# Patient Record
Sex: Female | Born: 2000 | Race: Black or African American | Hispanic: No | Marital: Single | Smoking: Never smoker
Health system: Southern US, Community
[De-identification: ages and names within clinical notes are randomized; demographics above are authoritative.]

## PROBLEM LIST (undated history)

## (undated) ENCOUNTER — Inpatient Hospital Stay: Payer: Self-pay

## (undated) DIAGNOSIS — Z862 Personal history of diseases of the blood and blood-forming organs and certain disorders involving the immune mechanism: Secondary | ICD-10-CM

## (undated) DIAGNOSIS — J45909 Unspecified asthma, uncomplicated: Secondary | ICD-10-CM

## (undated) DIAGNOSIS — O24419 Gestational diabetes mellitus in pregnancy, unspecified control: Secondary | ICD-10-CM

## (undated) DIAGNOSIS — Z369 Encounter for antenatal screening, unspecified: Secondary | ICD-10-CM

## (undated) DIAGNOSIS — D649 Anemia, unspecified: Secondary | ICD-10-CM

## (undated) DIAGNOSIS — L732 Hidradenitis suppurativa: Secondary | ICD-10-CM

## (undated) DIAGNOSIS — J189 Pneumonia, unspecified organism: Secondary | ICD-10-CM

## (undated) HISTORY — DX: Gestational diabetes mellitus in pregnancy, unspecified control: O24.419

## (undated) HISTORY — DX: Personal history of diseases of the blood and blood-forming organs and certain disorders involving the immune mechanism: Z86.2

## (undated) HISTORY — DX: Encounter for antenatal screening, unspecified: Z36.9

## (undated) HISTORY — DX: Hidradenitis suppurativa: L73.2

---
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
## 2018-01-27 NOTE — L&D Delivery Note (Signed)
Date of delivery: 08/03/18 Estimated Date of Delivery: 08/16/18 Patient's last menstrual period was 11/09/2017. EGA: [redacted]w[redacted]d  Delivery Note At 2:56 PM a viable female was delivered via Vaginal, Spontaneous (Presentation: direct OA; cephalic).  APGAR: 4, 7; 9   weight 7 lb 2.6 oz (3250 g).   Placenta status: spontaneous, intact, with circumvallate membranes, eccentric cord insertion, +infarctions.  Cord: 3vv with tight nuchal, reduced after delivery,  with the following complications: none apparent.  Cord pH: collected but not sent per peds - apgar was 9 at time of decision.  Anesthesia:  epidural Episiotomy:  no Lacerations:  2nd degree Suture Repair: 3.0 vicryl vicryl rapide Est. Blood Loss (mL): 6  Mom presented to L&D with labor, augmented with pitocin.  epidual placed. Progressed to complete, second stage: 3 hours, with manual rotation of fetus from ROP to L/direct OA. Tight nuchal encountered, unable to be reduced at perineum. Delivery of fetal head with restitution to ROT.   Anterior then posterior shoulders delivered without difficulty, and cord unwound from baby's neck and body.  Baby placed on mom's chest, and attended to by peds, however baby was not crying and remained flaccid.  After about 20 seconds, the cord was clamped and cut and the pediatric team took over at the warmer. Placenta spontaneously delivered, intact.   IV pitocin given for hemorrhage prophylaxis.  2nd degree repaired in standard fashion.  We sang happy birthday to baby Nazir.  Mom to postpartum.  Baby to Couplet care / Skin to Skin.  Chelsea C Ward 08/04/2018, 11:32 AM

## 2018-01-28 LAB — OB RESULTS CONSOLE HEPATITIS B SURFACE ANTIGEN: Hepatitis B Surface Ag: NEGATIVE

## 2018-01-28 LAB — OB RESULTS CONSOLE VARICELLA ZOSTER ANTIBODY, IGG: Varicella: NON-IMMUNE/NOT IMMUNE

## 2018-01-28 LAB — OB RESULTS CONSOLE RUBELLA ANTIBODY, IGM: Rubella: IMMUNE

## 2018-02-01 ENCOUNTER — Other Ambulatory Visit: Payer: Self-pay | Admitting: Certified Nurse Midwife

## 2018-02-01 DIAGNOSIS — Z369 Encounter for antenatal screening, unspecified: Secondary | ICD-10-CM

## 2018-02-08 ENCOUNTER — Ambulatory Visit
Admission: RE | Admit: 2018-02-08 | Discharge: 2018-02-08 | Disposition: A | Discharge status: Home / Self Care | Payer: Medicaid Other | Source: Ambulatory Visit | Attending: Obstetrics and Gynecology | Admitting: Obstetrics and Gynecology

## 2018-02-08 ENCOUNTER — Ambulatory Visit (HOSPITAL_BASED_OUTPATIENT_CLINIC_OR_DEPARTMENT_OTHER)
Admission: RE | Admit: 2018-02-08 | Discharge: 2018-02-08 | Disposition: A | Discharge status: Home / Self Care | Payer: Medicaid Other | Source: Ambulatory Visit | Attending: Obstetrics and Gynecology | Admitting: Obstetrics and Gynecology

## 2018-02-08 ENCOUNTER — Other Ambulatory Visit: Payer: Self-pay

## 2018-02-08 VITALS — BP 127/77 | HR 98 | Temp 98.2°F | Resp 18 | Ht 62.0 in | Wt 136.0 lb

## 2018-02-08 DIAGNOSIS — Z3A13 13 weeks gestation of pregnancy: Secondary | ICD-10-CM | POA: Insufficient documentation

## 2018-02-08 DIAGNOSIS — Z369 Encounter for antenatal screening, unspecified: Secondary | ICD-10-CM | POA: Diagnosis not present

## 2018-02-08 DIAGNOSIS — Z3682 Encounter for antenatal screening for nuchal translucency: Secondary | ICD-10-CM | POA: Diagnosis present

## 2018-02-08 HISTORY — DX: Unspecified asthma, uncomplicated: J45.909

## 2018-02-08 LAB — FERRITIN: FERRITIN: 19 ng/mL (ref 11–307)

## 2018-02-08 NOTE — Progress Notes (Addendum)
Jaci Standard Length of Consultation: 40 minutes   Ms. Letterman  was referred to [place] for genetic counseling to review prenatal screening and testing options.  This note summarizes the information we discussed.    We offered the following routine screening tests for this pregnancy:  First trimester screening, which includes nuchal translucency ultrasound screen and first trimester maternal serum marker screening.  The nuchal translucency has approximately an 80% detection rate for Down syndrome and can be positive for other chromosome abnormalities as well as congenital heart defects.  When combined with a maternal serum marker screening, the detection rate is up to 90% for Down syndrome and up to 97% for trisomy 18.     Maternal serum marker screening, a blood test that measures pregnancy proteins, can provide risk assessments for Down syndrome, trisomy 18, and open neural tube defects (spina bifida, anencephaly). Because it does not directly examine the fetus, it cannot positively diagnose or rule out these problems.  Targeted ultrasound uses high frequency sound waves to create an image of the developing fetus.  An ultrasound is often recommended as a routine means of evaluating the pregnancy.  It is also used to screen for fetal anatomy problems (for example, a heart defect) that might be suggestive of a chromosomal or other abnormality.   Should these screening tests indicate an increased concern, then the following additional testing options would be offered:  The chorionic villus sampling procedure is available for first trimester chromosome analysis.  This involves the withdrawal of a small amount of chorionic villi (tissue from the developing placenta).  Risk of pregnancy loss is estimated to be approximately 1 in 200 to 1 in 100 (0.5 to 1%).  There is approximately a 1% (1 in 100) chance that the CVS chromosome results will be unclear.  Chorionic villi  cannot be tested for neural tube defects.     Amniocentesis involves the removal of a small amount of amniotic fluid from the sac surrounding the fetus with the use of a thin needle inserted through the maternal abdomen and uterus.  Ultrasound guidance is used throughout the procedure.  Fetal cells from amniotic fluid are directly evaluated and > 99.5% of chromosome problems and > 98% of open neural tube defects can be detected. This procedure is generally performed after the 15th week of pregnancy.  The main risks to this procedure include complications leading to miscarriage in less than 1 in 200 cases (0.5%).  As another option for information if the pregnancy is suspected to be an an increased chance for certain chromosome conditions, we also reviewed the availability of cell free fetal DNA testing from maternal blood to determine whether or not the baby may have either Down syndrome, trisomy 58, or trisomy 58.  This test utilizes a maternal blood sample and DNA sequencing technology to isolate circulating cell free fetal DNA from maternal plasma.  The fetal DNA can then be analyzed for DNA sequences that are derived from the three most common chromosomes involved in aneuploidy, chromosomes 13, 18, and 21.  If the overall amount of DNA is greater than the expected level for any of these chromosomes, aneuploidy is suspected.  While we do not consider it a replacement for invasive testing and karyotype analysis, a negative result from this testing would be reassuring, though not a guarantee of a normal chromosome complement for the baby.  An abnormal result is certainly suggestive of an abnormal chromosome complement, though we would still  recommend CVS or amniocentesis to confirm any findings from this testing.  Cystic Fibrosis and Spinal Muscular Atrophy (SMA) screening were also discussed with the patient. Both conditions are recessive, which means that both parents must be carriers in order to have a  child with the disease.  Cystic fibrosis (CF) is one of the most common genetic conditions in persons of Caucasian ancestry.  This condition occurs in approximately 1 in 2,500 Caucasian persons and results in thickened secretions in the lungs, digestive, and reproductive systems.  For a baby to be at risk for having CF, both of the parents must be carriers for this condition.  Approximately 1 in 34 Caucasian persons is a carrier for CF.  Current carrier testing looks for the most common mutations in the gene for CF and can detect approximately 90% of carriers in the Caucasian population.  This means that the carrier screening can greatly reduce, but cannot eliminate, the chance for an individual to have a child with CF.  If an individual is found to be a carrier for CF, then carrier testing would be available for the partner. As part of [place] newborn screening profile, all babies born in the state of [place] will have a two-tier screening process.  Specimens are first tested to determine the concentration of immunoreactive trypsinogen (IRT).  The top 5% of specimens with the highest IRT values then undergo DNA testing using a panel of over 40 common CF mutations. SMA is a neurodegenerative disorder that leads to atrophy of skeletal muscle and overall weakness.  This condition is also more prevalent in the Caucasian population, with 1 in 40-1 in 60 persons being a carrier and 1 in 6,000-1 in 10,000 children being affected.  There are multiple forms of the disease, with some causing death in infancy to other forms with survival into adulthood.  The genetics of SMA is complex, but carrier screening can detect up to 95% of carriers in the Caucasian population.  Similar to CF, a negative result can greatly reduce, but cannot eliminate, the chance to have a child with SMA.  We obtained a detailed family history and pregnancy history. In the family, Ms. Kilty reported one maternal aunt with learning  disabilities related to severe premature birth.  Though there are many reasons for developmental differences, if this is related to the early delivery, then we would not expect an increased risk for other family members to have a similar condition. She also stated that one paternal half sister was born with polydactyly.  No other family members in this family have extra fingers or toes. This is a known dominant trait in many families but can also be part of numerous genetic syndromes.  In the absence of a syndrome, it may have been inherited from the mother's family (unrelated to Ms. Connett) or may be an isolated birth difference but would be expected to have a low chance for recurrence in this pregnancy given the lack of more closely related affected individuals. Lastly, the patient's father is said to have been born with an extra nipple.  He is otherwise in good health with normal growth and development.  This can be seen as an isolated finding or as part of genetic conditions as well.  Because he is otherwise in good health, it is unlikely to put this pregnancy at high risk for a genetic syndrome.The remainder of the family history was reported to be unremarkable for birth defects, intellectual delays, recurrent pregnancy loss or known  chromosome abnormalities.  Ms. Enfield reported that this is the first pregnancy for she and her partner, Novella Rob.  She reported no complications or exposures in this pregnancy that would be expected to increase the risk for birth defects. Upon review of prenatal labs, Ms. Leu had a low MCV (72).  We therefore offered iron studies and a hemoglobinopathy evaluation to assess the reason for this lab result.  Those were drawn today and should be available within 1 week.  After consideration of the options, Ms. Rada elected to proceed with first trimester screening, hemoglobinopathy testing and ferritin.  She declined CF and SMA carrier testing.  An ultrasound was  performed at the time of the visit.  The gestational age was consistent with 56 weeks.  Fetal anatomy could not be assessed due to early gestational age.  Please refer to the ultrasound report for details of that study.  Ms. Cornia was encouraged to call with questions or concerns.  We can be contacted at [phone].  Labs ordered:  First trimester screening, hemoglobinopathy profile, ferritin  Wilburt Finlay, MS, CGC I met the pt and agree with the summary of her counseling   Gatha Mayer

## 2018-02-09 LAB — HEMOGLOBINOPATHY EVALUATION
HGB A2 QUANT: 1.8 % (ref 1.8–3.2)
HGB A: 98.2 % (ref 96.4–98.8)
HGB C: 0 %
HGB F QUANT: 0 % (ref 0.0–2.0)
HGB S QUANTITAION: 0 %
Hgb Variant: 0 %

## 2018-02-11 ENCOUNTER — Telehealth: Payer: Self-pay | Admitting: Obstetrics and Gynecology

## 2018-02-11 NOTE — Telephone Encounter (Signed)
Morgan Collier  elected to undergo First Trimester screening on 02/08/2018.  To review, first trimester screening, includes nuchal translucency ultrasound screen and/or first trimester maternal serum marker screening.  The nuchal translucency has approximately an 80% detection rate for Down syndrome and can be positive for other chromosome abnormalities as well as heart defects.  When combined with a maternal serum marker screening, the detection rate is up to 90% for Down syndrome and up to 97% for trisomy 13 and 18.     The results of the First Trimester Nuchal Translucency and Biochemical Screening were within normal range.  The risk for Down syndrome is now estimated to be 1 in 4,228.  The risk for Trisomy 13/18 is less than 1 in 10,000.  Should more definitive information be desired, we would offer amniocentesis.  Because we do not yet know the effectiveness of combined first and second trimester screening, we do not recommend a maternal serum screen to assess the chance for chromosome conditions.  However, if screening for neural tube defects is desired, maternal serum screening for AFP only can be performed between 15 and [redacted] weeks gestation.    Because the patient has a low MCV (72), we offered additional testing for hemoglobinopathies and iron studies.  The hemoglobinopathy evaluation showed normal adult hemoglobin (AA).  Per Dr. Jimmey Ralph, the iron was mildly low (19) and she would recommend an over the counter iron supplement.  Another possible reason for low MCV with a normal hemoglobinopathy evaluation could be alpha thalassemia trait.  Two gene deletion alpha thalassemia can be an explanation.  If she would like additional testing, we can offer genetic testing for alpha thalassemia.  Most cases of two gene deletion alpha thalassemia in African Americans occur in trans, meaning that each chromosome contains one normal gene and one deleted gene.  In this case, only 1 deleted gene is passed on  from the carrier parent.  Even if the other parent has two gene deletions as well, then fetus would most likely only have two gene deletions, though additional testing on the patient and possibly her partner would be needed to further assess this.  Newborn screening at the time of birth can be used as well as determine the presence of clinically significant hemoglobinopathies in this newborn.   Morgan Anderson, MS, CGC

## 2018-04-30 ENCOUNTER — Inpatient Hospital Stay: Payer: Medicaid Other

## 2018-04-30 ENCOUNTER — Observation Stay
Admission: EM | Admit: 2018-04-30 | Discharge: 2018-04-30 | Disposition: A | Discharge status: Home / Self Care | Payer: Medicaid Other | Attending: Obstetrics and Gynecology | Admitting: Obstetrics and Gynecology

## 2018-04-30 ENCOUNTER — Other Ambulatory Visit: Payer: Self-pay

## 2018-04-30 DIAGNOSIS — O4692 Antepartum hemorrhage, unspecified, second trimester: Principal | ICD-10-CM | POA: Insufficient documentation

## 2018-04-30 DIAGNOSIS — J45909 Unspecified asthma, uncomplicated: Secondary | ICD-10-CM | POA: Insufficient documentation

## 2018-04-30 DIAGNOSIS — O99512 Diseases of the respiratory system complicating pregnancy, second trimester: Secondary | ICD-10-CM | POA: Insufficient documentation

## 2018-04-30 DIAGNOSIS — Z79899 Other long term (current) drug therapy: Secondary | ICD-10-CM | POA: Insufficient documentation

## 2018-04-30 DIAGNOSIS — Z3A24 24 weeks gestation of pregnancy: Secondary | ICD-10-CM | POA: Insufficient documentation

## 2018-04-30 DIAGNOSIS — O4690 Antepartum hemorrhage, unspecified, unspecified trimester: Secondary | ICD-10-CM | POA: Diagnosis present

## 2018-04-30 DIAGNOSIS — R58 Hemorrhage, not elsewhere classified: Secondary | ICD-10-CM

## 2018-04-30 LAB — CBC
HCT: 36.1 % (ref 36.0–46.0)
Hemoglobin: 11.6 g/dL — ABNORMAL LOW (ref 12.0–15.0)
MCH: 23.1 pg — ABNORMAL LOW (ref 26.0–34.0)
MCHC: 32.1 g/dL (ref 30.0–36.0)
MCV: 71.9 fL — ABNORMAL LOW (ref 80.0–100.0)
Platelets: 244 10*3/uL (ref 150–400)
RBC: 5.02 MIL/uL (ref 3.87–5.11)
RDW: 15.3 % (ref 11.5–15.5)
WBC: 10.5 10*3/uL (ref 4.0–10.5)
nRBC: 0 % (ref 0.0–0.2)

## 2018-04-30 LAB — URINALYSIS, ROUTINE W REFLEX MICROSCOPIC
Bacteria, UA: NONE SEEN
Bilirubin Urine: NEGATIVE
Glucose, UA: NEGATIVE mg/dL
Hgb urine dipstick: NEGATIVE
Ketones, ur: NEGATIVE mg/dL
Nitrite: NEGATIVE
Protein, ur: NEGATIVE mg/dL
Specific Gravity, Urine: 1.013 (ref 1.005–1.030)
pH: 7 (ref 5.0–8.0)

## 2018-04-30 LAB — FETAL SCREEN: Fetal Screen: NEGATIVE

## 2018-04-30 LAB — TYPE AND SCREEN
ABO/RH(D): A NEG
Antibody Screen: NEGATIVE

## 2018-04-30 MED ORDER — RHO D IMMUNE GLOBULIN 1500 UNIT/2ML IJ SOSY
300.0000 ug | PREFILLED_SYRINGE | Freq: Once | INTRAMUSCULAR | Status: DC
  Filled 2018-04-30: qty 2

## 2018-04-30 NOTE — Discharge Summary (Signed)
Patient ID: Morgan Collier, female   DOB: 01/01/2001, 18 y.o.   MRN: [id number]  Subjective   Morgan Collier is a 18 y.o. female. She is at [redacted]w[redacted]d gestation. Patient's last menstrual period was 11/09/2017. Estimated Date of Delivery: 08/16/18 G1P0 Prenatal care site: [facility]   Chief complaint:vaginal spotting and cramping started last pm  Good fetal movement . No dysuria or gross hematuria . No recent intercourse  placenta fundal on anatomic exam  RH neg       Maternal Medical History:       Past Medical History:  Diagnosis Date  . Asthma    Seasonal    Past Surgical History:  Procedure Laterality Date  . INCISION AND DRAINAGE ABSCESS Right 10/10/2015   Procedure: Incision and drainage of complex axillary abscess Excisions of complex hidradenitis on right axilla                            3. Excisional debridement of skin subcutaneous tissue;  Surgeon: Leafy Ro, MD;  Location: [facility];  Service: General;  Laterality: Right;  . WISDOM TOOTH EXTRACTION  2016    No Known Allergies         Prior to Admission medications   Medication Sig Start Date End Date Taking? Authorizing Provider  Prenatal Vit-Fe Fumarate-FA (PRENATAL MULTIVITAMIN) TABS tablet Take 1 tablet by mouth daily at 12 noon.   Yes [provider]  PROAIR HFA 108 (90 Base) MCG/ACT inhaler Inhale 2 puffs into the lungs every 6 (six) hours as needed. 10/26/15  Yes [provider]     Social History: She  reports that she has never smoked. She has never used smokeless tobacco. She reports that she does not drink alcohol or use drugs.  Family History: family history includes ADD / ADHD in her father; Heart disease in her maternal grandmother; Hypertension in her maternal grandmother and paternal grandmother; Throat cancer in her maternal uncle. no history of gyn cancers  Review of Systems: A full review of systems was performed and negative  except as noted in the HPI.   Review of Systems: A full review of systems was performed and negativeexcept as noted in the HPI.  Eyes: no vision change  Ears: left ear pain  Oropharynx: no sore throat  Pulmonary . No shortness of breath , no hemoptysis Cardiovascular: no chest pain , no irregular heart beat  Gastrointestinal:no blood in stool . No diarrhea, no constipation Uro gynecologic: no dysuria , no pelvic pain, + vaginal spotting  Neurologic : no seizure , no migraines  Musculoskeletal: no muscular weakness  O:  Objective   BP 134/79 (BP Location: Left Arm)   Pulse (!) 103   Temp 98.3 F (36.8 C) (Oral)   Resp 18   Ht  (1.6 m)   Wt 68.5 kg   LMP 11/09/2017   SpO2 100%   BMI 26.75 kg/m       Results for orders placed or performed during the hospital encounter of 04/30/18 (from the past 48 hour(s))  CBC   Collection Time: 04/30/18  1:31 PM  Result Value Ref Range   WBC 10.5 4.0 - 10.5 K/uL   RBC 5.02 3.87 - 5.11 MIL/uL   Hemoglobin 11.6 (L) 12.0 - 15.0 g/dL   HCT 65.7 84.6 - 96.2 %   MCV 71.9 (L) 80.0 - 100.0 fL   MCH 23.1 (L) 26.0 - 34.0 pg  MCHC 32.1 30.0 - 36.0 g/dL   RDW 40.0 86.7 - 61.9 %   Platelets 244 150 - 400 K/uL   nRBC 0.0 0.0 - 0.2 %     EXAM: LIMITED OBSTETRIC ULTRASOUND  FINDINGS: Number of Fetuses: 1  Heart Rate: 141 bpm  Movement: Yes  Presentation: Cephalic  Placental Location: Posterior  Previa: No  Amniotic Fluid (Subjective): Within normal limits  BPD: 6.4 cm 25 w 6 d  MATERNAL FINDINGS:  Cervix: Appears closed. Measures 3.9 cm in length.  Uterus/Adnexae: No abnormality visualized.  IMPRESSION: Single living IUP in cephalic presentation. No evidence of placenta previa, abruption, or other acute maternal findings.  This exam is performed on an emergent basis and does not comprehensively evaluate fetal size, dating, or anatomy; follow-up complete OB US should be considered if  further fetal assessment is warranted.   Constitutional: NAD, AAOx3  HE/ENT: extraocular movements grossly intact, moist mucous membranes CV: RRR PULM: nl respiratory effort, CTABL                                         Abd: gravid, non-tender, non-distended, soft                                                  Ext: Non-tender, Nonedmeatous                     Psych: mood appropriate, speech normal Pelvic cx closed + long . No blood on exam glove     NST:  Baseline: 140-150 Variability: moderate  Decelerations absent Time  LabResultsLast24Hours       Results for orders placed or performed during the hospital encounter of 04/30/18 (from the past 24 hour(s))  CBC     Status: Abnormal   Collection Time: 04/30/18  1:31 PM  Result Value Ref Range   WBC 10.5 4.0 - 10.5 K/uL   RBC 5.02 3.87 - 5.11 MIL/uL   Hemoglobin 11.6 (L) 12.0 - 15.0 g/dL   HCT 50.9 32.6 - 71.2 %   MCV 71.9 (L) 80.0 - 100.0 fL   MCH 23.1 (L) 26.0 - 34.0 pg   MCHC 32.1 30.0 - 36.0 g/dL   RDW 45.8 09.9 - 83.3 %   Platelets 244 150 - 400 K/uL   nRBC 0.0 0.0 - 0.2 %  Urinalysis, Routine w reflex microscopic     Status: Abnormal   Collection Time: 04/30/18  2:31 PM  Result Value Ref Range   Color, Urine YELLOW (A) YELLOW   APPearance CLOUDY (A) CLEAR   Specific Gravity, Urine 1.013 1.005 - 1.030   pH 7.0 5.0 - 8.0   Glucose, UA NEGATIVE NEGATIVE mg/dL   Hgb urine dipstick NEGATIVE NEGATIVE   Bilirubin Urine NEGATIVE NEGATIVE   Ketones, ur NEGATIVE NEGATIVE mg/dL   Protein, ur NEGATIVE NEGATIVE mg/dL   Nitrite NEGATIVE NEGATIVE   Leukocytes,Ua LARGE (A) NEGATIVE   RBC / HPF 0-5 0 - 5 RBC/hpf   WBC, UA 0-5 0 - 5 WBC/hpf   Bacteria, UA NONE SEEN NONE SEEN   Squamous Epithelial / LPF 0-5 0 - 5   Mucus PRESENT       Assessment: 18 y.o. [redacted]w[redacted]d here for antenatal surveillance during pregnancy.  Principle diagnosis: VAginal bleeding , no evidence on exam  glove today . No cervical dilation Normal U/S   Plan: D/c home with precautions   RTC if bleeding is noticed again  Rhogam administered today  ----- Beverly Gust MD Attending Obstetrician and Gynecologist [facility], Department of OB/GYN [facility]

## 2018-04-30 NOTE — Progress Notes (Signed)
Patient ID: Morgan Collier, female   DOB: 10-Jun-2000, 18 y.o.   MRN: [id number]  Morgan Collier is a 18 y.o. female. She is at [redacted]w[redacted]d gestation. Patient's last menstrual period was 11/09/2017. Estimated Date of Delivery: 08/16/18 G1P0 Prenatal care site: [facility]   Chief complaint:vaginal spotting and cramping started last pm  Good fetal movement . No dysuria or gross hematuria . No recent intercourse  placenta fundal on anatomic exam  RH neg       Maternal Medical History:   Past Medical History:  Diagnosis Date  . Asthma    Seasonal    Past Surgical History:  Procedure Laterality Date  . INCISION AND DRAINAGE ABSCESS Right 10/10/2015   Procedure: Incision and drainage of complex axillary abscess Excisions of complex hidradenitis on right axilla                            3. Excisional debridement of skin subcutaneous tissue;  Surgeon: Leafy Ro, MD;  Location: [facility];  Service: General;  Laterality: Right;  . WISDOM TOOTH EXTRACTION  2016    No Known Allergies  Prior to Admission medications   Medication Sig Start Date End Date Taking? Authorizing Provider  Prenatal Vit-Fe Fumarate-FA (PRENATAL MULTIVITAMIN) TABS tablet Take 1 tablet by mouth daily at 12 noon.   Yes [provider]  PROAIR HFA 108 (90 Base) MCG/ACT inhaler Inhale 2 puffs into the lungs every 6 (six) hours as needed. 10/26/15  Yes [provider]     Social History: She  reports that she has never smoked. She has never used smokeless tobacco. She reports that she does not drink alcohol or use drugs.  Family History: family history includes ADD / ADHD in her father; Heart disease in her maternal grandmother; Hypertension in her maternal grandmother and paternal grandmother; Throat cancer in her maternal uncle. no history of gyn cancers  Review of Systems: A full review of systems was performed and negative except as noted in the HPI.   Review of Systems: A full  review of systems was performed and negative except as noted in the HPI.   Eyes: no vision change  Ears: left ear pain  Oropharynx: no sore throat  Pulmonary . No shortness of breath , no hemoptysis Cardiovascular: no chest pain , no irregular heart beat  Gastrointestinal:no blood in stool . No diarrhea, no constipation Uro gynecologic: no dysuria , no pelvic pain, + vaginal spotting  Neurologic : no seizure , no migraines    Musculoskeletal: no muscular weakness  O:  BP 134/79 (BP Location: Left Arm)   Pulse (!) 103   Temp 98.3 F (36.8 C) (Oral)   Resp 18   Ht 5\' 3"  (1.6 m)   Wt 68.5 kg   LMP 11/09/2017   SpO2 100%   BMI 26.75 kg/m  Results for orders placed or performed during the hospital encounter of 04/30/18 (from the past 48 hour(s))  CBC   Collection Time: 04/30/18  1:31 PM  Result Value Ref Range   WBC 10.5 4.0 - 10.5 K/uL   RBC 5.02 3.87 - 5.11 MIL/uL   Hemoglobin 11.6 (L) 12.0 - 15.0 g/dL   HCT 75.9 16.3 - 84.6 %   MCV 71.9 (L) 80.0 - 100.0 fL   MCH 23.1 (L) 26.0 - 34.0 pg   MCHC 32.1 30.0 - 36.0 g/dL   RDW 65.9 93.5 - 70.1 %   Platelets  244 150 - 400 K/uL   nRBC 0.0 0.0 - 0.2 %     EXAM: LIMITED OBSTETRIC ULTRASOUND  FINDINGS: Number of Fetuses: 1  Heart Rate:  141 bpm  Movement: Yes  Presentation: Cephalic  Placental Location: Posterior  Previa: No  Amniotic Fluid (Subjective):  Within normal limits  BPD: 6.4 cm 25 w  6 d  MATERNAL FINDINGS:  Cervix:  Appears closed.  Measures 3.9 cm in length.  Uterus/Adnexae: No abnormality visualized.  IMPRESSION: Single living IUP in cephalic presentation. No evidence of placenta previa, abruption, or other acute maternal findings.  This exam is performed on an emergent basis and does not comprehensively evaluate fetal size, dating, or anatomy; follow-up complete OB US should be considered if further fetal assessment is warranted.   Constitutional: NAD, AAOx3  HE/ENT:  extraocular movements grossly intact, moist mucous membranes CV: RRR PULM: nl respiratory effort, CTABL     Abd: gravid, non-tender, non-distended, soft      Ext: Non-tender, Nonedmeatous   Psych: mood appropriate, speech normal Pelvic cx closed + long . No blood on exam glove     NST:  Baseline: 140-150 Variability: moderate  Decelerations absent Time  Results for orders placed or performed during the hospital encounter of 04/30/18 (from the past 24 hour(s))  CBC     Status: Abnormal   Collection Time: 04/30/18  1:31 PM  Result Value Ref Range   WBC 10.5 4.0 - 10.5 K/uL   RBC 5.02 3.87 - 5.11 MIL/uL   Hemoglobin 11.6 (L) 12.0 - 15.0 g/dL   HCT 78.4 69.6 - 29.5 %   MCV 71.9 (L) 80.0 - 100.0 fL   MCH 23.1 (L) 26.0 - 34.0 pg   MCHC 32.1 30.0 - 36.0 g/dL   RDW 28.4 13.2 - 44.0 %   Platelets 244 150 - 400 K/uL   nRBC 0.0 0.0 - 0.2 %  Urinalysis, Routine w reflex microscopic     Status: Abnormal   Collection Time: 04/30/18  2:31 PM  Result Value Ref Range   Color, Urine YELLOW (A) YELLOW   APPearance CLOUDY (A) CLEAR   Specific Gravity, Urine 1.013 1.005 - 1.030   pH 7.0 5.0 - 8.0   Glucose, UA NEGATIVE NEGATIVE mg/dL   Hgb urine dipstick NEGATIVE NEGATIVE   Bilirubin Urine NEGATIVE NEGATIVE   Ketones, ur NEGATIVE NEGATIVE mg/dL   Protein, ur NEGATIVE NEGATIVE mg/dL   Nitrite NEGATIVE NEGATIVE   Leukocytes,Ua LARGE (A) NEGATIVE   RBC / HPF 0-5 0 - 5 RBC/hpf   WBC, UA 0-5 0 - 5 WBC/hpf   Bacteria, UA NONE SEEN NONE SEEN   Squamous Epithelial / LPF 0-5 0 - 5   Mucus PRESENT     Assessment: 18 y.o. [redacted]w[redacted]d here for antenatal surveillance during pregnancy.  Principle diagnosis: VAginal bleeding , no evidence on exam glove today . No cervical dilation Normal U/S   Plan: D/c home with precautions   RTC if bleeding is noticed again  Rhogam administered today  ----- Beverly Gust MD Attending Obstetrician and Gynecologist [facility], Department of  OB/GYN [facility]

## 2018-04-30 NOTE — Discharge Summary (Signed)
RN reviewed lab results and discharge instructions with patient. Gave pt opportunity for any questions. Pt has no questions at this time. Pt verbalized understanding of discharge instructions. Pt discharged home

## 2018-04-30 NOTE — OB Triage Note (Signed)
Pt presents c/o vaginal bleeding described as spotting, bright red in color. Pt states she also has had some intermittent lower abdominal cramping develop within the last hour. Pt states she only notices bleeding when she wipes after using the restroom. She does not have to wear a pad. Denies any LOF. Reports last intercourse on March 28th.  Pt reports positive fetal movement. Vitals WNL. Will continue to monitor.

## 2018-05-01 ENCOUNTER — Encounter: Payer: Self-pay | Admitting: Certified Nurse Midwife

## 2018-05-01 ENCOUNTER — Observation Stay
Admission: EM | Admit: 2018-05-01 | Discharge: 2018-05-01 | Disposition: A | Discharge status: Home / Self Care | Payer: Medicaid Other | Attending: Certified Nurse Midwife | Admitting: Certified Nurse Midwife

## 2018-05-01 DIAGNOSIS — O209 Hemorrhage in early pregnancy, unspecified: Secondary | ICD-10-CM | POA: Diagnosis present

## 2018-05-01 DIAGNOSIS — J45909 Unspecified asthma, uncomplicated: Secondary | ICD-10-CM | POA: Diagnosis not present

## 2018-05-01 DIAGNOSIS — O26892 Other specified pregnancy related conditions, second trimester: Secondary | ICD-10-CM | POA: Diagnosis not present

## 2018-05-01 DIAGNOSIS — Z3A24 24 weeks gestation of pregnancy: Secondary | ICD-10-CM | POA: Diagnosis not present

## 2018-05-01 DIAGNOSIS — R1030 Lower abdominal pain, unspecified: Secondary | ICD-10-CM | POA: Insufficient documentation

## 2018-05-01 DIAGNOSIS — O99512 Diseases of the respiratory system complicating pregnancy, second trimester: Secondary | ICD-10-CM | POA: Diagnosis not present

## 2018-05-01 DIAGNOSIS — Z79899 Other long term (current) drug therapy: Secondary | ICD-10-CM | POA: Diagnosis not present

## 2018-05-01 DIAGNOSIS — O4692 Antepartum hemorrhage, unspecified, second trimester: Secondary | ICD-10-CM | POA: Diagnosis present

## 2018-05-01 DIAGNOSIS — N760 Acute vaginitis: Secondary | ICD-10-CM | POA: Insufficient documentation

## 2018-05-01 LAB — RHOGAM INJECTION: Unit division: 0

## 2018-05-01 LAB — URINE CULTURE
Culture: <10000 — AB
Special Requests: NORMAL

## 2018-05-01 LAB — WET PREP, GENITAL
Sperm: NONE SEEN
Trich, Wet Prep: NONE SEEN

## 2018-05-01 LAB — CHLAMYDIA/NGC RT PCR (ARMC ONLY)
Chlamydia Tr: NOT DETECTED
N gonorrhoeae: NOT DETECTED

## 2018-05-01 MED ORDER — METRONIDAZOLE 500 MG PO TABS: 500.0000 mg | ORAL_TABLET | Freq: Two times a day (BID) | ORAL | 0 refills | Status: AC

## 2018-05-01 MED ORDER — ACETAMINOPHEN 325 MG PO TABS: 650.0000 mg | ORAL_TABLET | ORAL | Status: DC | PRN

## 2018-05-01 MED ORDER — MICONAZOLE NITRATE 2 % VA CREA: 1.0000 | TOPICAL_CREAM | Freq: Every day | VAGINAL | 0 refills | Status: AC

## 2018-05-01 MED ORDER — ACETAMINOPHEN 325 MG PO TABS: 650.0000 mg | ORAL_TABLET | ORAL | 0 refills | Status: DC | PRN

## 2018-05-01 NOTE — Discharge Summary (Addendum)
Morgan Collier is a 18 y.o. female. She is at [redacted]w[redacted]d gestation. Patient's last menstrual period was 11/09/2017. Estimated Date of Delivery: 08/16/18  Prenatal care site: [facility]   Chief complaint: vaginal bleeding Location: vagina Onset/timing: started 4/2 in the evening Duration: intermittent Quality: bright red spotting with wiping Severity: mild Aggravating or alleviating conditions: none Associated signs/symptoms: lower abdominal cramping Context: Morgan Collier was seen in triage yesterday with reports of vaginal spotting, bright red, that started the evening of 4/2. She noticed it only with wiping when using the bathroom. She is here today, reporting that she has continued to have bright red spotting with wiping. She also noticed a small, tissue-like substance today, which she says she had in the past as well. The picture of this tissue appears to be melanated and is consistent with skin tissue. She also reports lower abdominal cramping and urinary frequency. She reports no recent vaginal sex. She reports no dysuria, no abnormal vaginal discharge, and no vaginal odor. She did undergo TCA treatment on the day of 04/29/2018 for verrucal lesions.   S: Resting comfortably.   She reports:  -active fetal movement -no leakage of fluid -no contractions  Maternal Medical History:   Past Medical History:  Diagnosis Date  . Asthma    Seasonal    Past Surgical History:  Procedure Laterality Date  . INCISION AND DRAINAGE ABSCESS Right 10/10/2015   Procedure: Incision and drainage of complex axillary abscess Excisions of complex hidradenitis on right axilla                            3. Excisional debridement of skin subcutaneous tissue;  Surgeon: Leafy Ro, MD;  Location: [facility];  Service: General;  Laterality: Right;  . WISDOM TOOTH EXTRACTION  2016    No Known Allergies  Prior to Admission medications   Medication Sig Start Date End Date Taking? Authorizing  Provider  Prenatal Vit-Fe Fumarate-FA (PRENATAL MULTIVITAMIN) TABS tablet Take 1 tablet by mouth daily at 12 noon.   Yes [provider]  PROAIR HFA 108 (90 Base) MCG/ACT inhaler Inhale 2 puffs into the lungs every 6 (six) hours as needed. 10/26/15  Yes [provider]     Social History: She  reports that she has never smoked. She has never used smokeless tobacco. She reports that she does not drink alcohol or use drugs.  Family History: family history includes ADD / ADHD in her father; Heart disease in her maternal grandmother; Hypertension in her maternal grandmother and paternal grandmother; Throat cancer in her maternal uncle.   Review of Systems: A full review of systems was performed and negative except as noted in the HPI.    O:  BP 133/77 (BP Location: Left Arm)   Pulse (!) 112   Temp 98.7 F (37.1 C) (Oral)   Resp 20   Ht  (1.6 m)   Wt 69 kg   LMP 11/09/2017   BMI 26.95 kg/m  Results for orders placed or performed during the hospital encounter of 05/01/18 (from the past 48 hour(s))  Wet prep, genital   Collection Time: 05/01/18  1:20 PM  Result Value Ref Range   Yeast Wet Prep HPF POC PRESENT (A) NONE SEEN   Trich, Wet Prep NONE SEEN NONE SEEN   Clue Cells Wet Prep HPF POC PRESENT (A) NONE SEEN   WBC, Wet Prep HPF POC TOO NUMEROUS TO COUNT (A) NONE SEEN  Sperm NONE SEEN   Results for orders placed or performed during the hospital encounter of 04/30/18 (from the past 48 hour(s))  CBC   Collection Time: 04/30/18  1:31 PM  Result Value Ref Range   WBC 10.5 4.0 - 10.5 K/uL   RBC 5.02 3.87 - 5.11 MIL/uL   Hemoglobin 11.6 (L) 12.0 - 15.0 g/dL   HCT 28.3 15.1 - 76.1 %   MCV 71.9 (L) 80.0 - 100.0 fL   MCH 23.1 (L) 26.0 - 34.0 pg   MCHC 32.1 30.0 - 36.0 g/dL   RDW 60.7 37.1 - 06.2 %   Platelets 244 150 - 400 K/uL   nRBC 0.0 0.0 - 0.2 %  Urinalysis, Routine w reflex microscopic   Collection Time: 04/30/18  2:31 PM  Result Value Ref Range    Color, Urine YELLOW (A) YELLOW   APPearance CLOUDY (A) CLEAR   Specific Gravity, Urine 1.013 1.005 - 1.030   pH 7.0 5.0 - 8.0   Glucose, UA NEGATIVE NEGATIVE mg/dL   Hgb urine dipstick NEGATIVE NEGATIVE   Bilirubin Urine NEGATIVE NEGATIVE   Ketones, ur NEGATIVE NEGATIVE mg/dL   Protein, ur NEGATIVE NEGATIVE mg/dL   Nitrite NEGATIVE NEGATIVE   Leukocytes,Ua LARGE (A) NEGATIVE   RBC / HPF 0-5 0 - 5 RBC/hpf   WBC, UA 0-5 0 - 5 WBC/hpf   Bacteria, UA NONE SEEN NONE SEEN   Squamous Epithelial / LPF 0-5 0 - 5   Mucus PRESENT   Fetal screen   Collection Time: 04/30/18  5:02 PM  Result Value Ref Range   Fetal Screen      NEG Performed at [facility], [address] [place], [place] [address]   Type and screen [facility]   Collection Time: 04/30/18  5:02 PM  Result Value Ref Range   ABO/RH(D) A NEG    Antibody Screen NEG    Sample Expiration      05/03/2018 Performed at [facility], [address] [place], [place] [address]   Rhogam injection   Collection Time: 04/30/18  5:02 PM  Result Value Ref Range   Unit Number [id number]    Blood Component Type RHIG    Unit division 00    Status of Unit ISSUED,FINAL    Transfusion Status      OK TO TRANSFUSE Performed at [facility], [address] [place], [place] [address]      Constitutional: NAD, AAOx3, appears quite comfortable HE/ENT: extraocular movements grossly intact, moist mucous membranes CV: RRR PULM: normal respiratory effort, CTABL     Abd: gravid, non-tender, non-distended, soft      Ext: Non-tender, Nonedmeatous   Psych: mood appropriate, speech normal Pelvic: SSE: cervix visually closed, small amount of thin white discharge in vaginal vault, no blood or active bleeding present; external exam: area of irritation on left labia, likely site of TCA treatment on 4/2  NST:  Baseline: 130-140bpm Variability: moderate Accelerations: 10x10 present x  >2 Decelerations: absent Time: Toco: quiet   Morgan Collier: 18 y.o. [redacted]w[redacted]d here for antenatal surveillance during pregnancy.  Principle diagnosis: vulvar bleeding during pregnancy  Labor  Not present  Fetal Wellbeing  Reactive NST, reassuring for GA  Bleeding  Likely from area of TCA treatment, which appears quite irritated. Advised application of Vaseline to this area.   GC/CT pending, plan to follow up as appropriate  Vaginitis  Wet prep with yeast, clue cells, and WBCs too numerous to count  Prescriptions for metronidazole 500mg  PO BID x 7 days and miconazole per vagina x 7 days sent to preferred pharmacy.   Cramping  No evidence of contractions on toco, no cervical dilation  Advised adequate hydration with at least 60 ounces of water daily and Tylenol PRN  Urine culture from yesterday pending, plan to follow up as appropriate  D/c home stable, precautions reviewed, follow-up as scheduled.    Genia Del 05/01/2018 1:55 PM  ----- Genia Del, CNM Certified Nurse Midwife [facility], Department of OB/GYN [facility]

## 2018-05-01 NOTE — OB Triage Note (Signed)
Pt was seen yesterday here for vaginal bleeding and cramping and sent home. This AM she stated that she continued to have bright red spotting when she wiped and then a "thing" fell out of her. Pt has picture of a small, tissue-like substance. Pt states she continues to have constant cramping and rates them 7/10. Pt denies LOF and endorses +FM.

## 2018-05-28 ENCOUNTER — Other Ambulatory Visit: Payer: Self-pay

## 2018-05-28 ENCOUNTER — Encounter: Payer: Self-pay | Admitting: *Deleted

## 2018-05-28 ENCOUNTER — Encounter: Payer: Medicaid Other | Attending: Obstetrics & Gynecology | Admitting: *Deleted

## 2018-05-28 VITALS — BP 108/60 | Ht 62.0 in | Wt 157.2 lb

## 2018-05-28 DIAGNOSIS — O24419 Gestational diabetes mellitus in pregnancy, unspecified control: Secondary | ICD-10-CM | POA: Insufficient documentation

## 2018-05-28 DIAGNOSIS — Z3A00 Weeks of gestation of pregnancy not specified: Secondary | ICD-10-CM | POA: Insufficient documentation

## 2018-05-28 DIAGNOSIS — O24410 Gestational diabetes mellitus in pregnancy, diet controlled: Secondary | ICD-10-CM

## 2018-05-28 NOTE — Patient Instructions (Signed)
Read booklet on Gestational Diabetes Follow Gestational Meal Planning Guidelines Don't skip meals Avoid fruit juices and sugar sweetened drinks Complete a 3 Day Food Record and bring to next appointment Check blood sugars 4 x day - before breakfast and 2 hrs after every meal and record  Bring blood sugar log to all appointments Call MD for prescription for meter strips and lancets Strips   Accu-Chek Guide Lancets   Accu-Chek FastClix Purchase urine ketone strips if blood sugars not controlled and check urine ketones every am:  If + increase bedtime snack to 1 protein and 2 carbohydrate servings Walk 20-30 minutes at least 5 x week if permitted by MD

## 2018-05-28 NOTE — Progress Notes (Signed)
Diabetes Self-Management Education  Visit Type: First/Initial  Appt. Start Time: 0905 Appt. End Time: 1035  05/28/2018  [name], identified by name and date of birth, is a 18 y.o. female with a diagnosis of Diabetes: Gestational Diabetes.   ASSESSMENT  Blood pressure 108/60, height 5\' 2"  (1.575 m), weight 157 lb 3.2 oz (71.3 kg), last menstrual period 11/09/2017. Body mass index is 28.75 kg/m.  Diabetes Self-Management Education - 05/28/18 1005      Visit Information   Visit Type  First/Initial      Initial Visit   Diabetes Type  Gestational Diabetes    Are you currently following a meal plan?  No    Are you taking your medications as prescribed?  Yes    Date Diagnosed  April 27      Health Coping   How would you rate your overall health?  Good      Psychosocial Assessment   Patient Belief/Attitude about Diabetes  Afraid   "scared, nervous"   Self-care barriers  None    Self-management support  Doctor's office;Family    Other persons present  Family Member   young sister   Patient Concerns  Nutrition/Meal planning;Glycemic Control;Monitoring;Healthy Lifestyle    Special Needs  None    Preferred Learning Style  Auditory;Visual;Hands on;Other (comment)   taking notes   Learning Readiness  Ready    How often do you need to have someone help you when you read instructions, pamphlets, or other written materials from your doctor or pharmacy?  1 - Never    What is the last grade level you completed in school?  currently in college      Pre-Education Assessment   Patient understands the diabetes disease and treatment process.  Needs Instruction    Patient understands incorporating nutritional management into lifestyle.  Needs Instruction    Patient undertands incorporating physical activity into lifestyle.  Needs Instruction    Patient understands using medications safely.  Needs Instruction    Patient understands monitoring blood glucose, interpreting and using  results  Needs Instruction    Patient understands prevention, detection, and treatment of acute complications.  Needs Instruction    Patient understands prevention, detection, and treatment of chronic complications.  Needs Instruction    Patient understands how to develop strategies to address psychosocial issues.  Needs Instruction    Patient understands how to develop strategies to promote health/change behavior.  Needs Instruction      Complications   How often do you check your blood sugar?  0 times/day (not testing)   Provided Accu-Chek Guide Me meter and instructed on use. BG upon return demonstration was 87 mg/dL at 16:1010:25 am - fasting.    Have you had a dilated eye exam in the past 12 months?  No    Have you had a dental exam in the past 12 months?  Yes    Are you checking your feet?  No      Dietary Intake   Breakfast  skips    Lunch  "anything" - burger, chicken nuggets, salad    Snack (afternoon)  chips    Dinner  chicken, steak, pork, spaghetti, potatoes, corn, baked beans, green beans, rice, greens, lettuce tomatoes, carrots, cuccumbers, broccoli    Snack (evening)  chips    Beverage(s)  water, fruit juice, lemonade with sugar      Exercise   Exercise Type  ADL's      Patient Education   Previous Diabetes Education  No   some education in college   Disease state   Definition of diabetes, type 1 and 2, and the diagnosis of diabetes;Factors that contribute to the development of diabetes    Nutrition management   Role of diet in the treatment of diabetes and the relationship between the three main macronutrients and blood glucose level;Reviewed blood glucose goals for pre and post meals and how to evaluate the patients' food intake on their blood glucose level.    Physical activity and exercise   Role of exercise on diabetes management, blood pressure control and cardiac health.    Monitoring  Taught/evaluated SMBG meter.;Purpose and frequency of SMBG.;Taught/discussed  recording of test results and interpretation of SMBG.;Ketone testing, when, how.    Chronic complications  Relationship between chronic complications and blood glucose control    Psychosocial adjustment  Identified and addressed patients feelings and concerns about diabetes    Preconception care  Pregnancy and GDM  Role of pre-pregnancy blood glucose control on the development of the fetus;Role of family planning for patients with diabetes;Reviewed with patient blood glucose goals with pregnancy      Individualized Goals (developed by patient)   Reducing Risk  Improve blood sugars Prevent diabetes complications Lead a healthier lifestyle Become more fit     Outcomes   Expected Outcomes  Demonstrated interest in learning. Expect positive outcomes       Individualized Plan for Diabetes Self-Management Training:   Learning Objective:  Patient will have a greater understanding of diabetes self-management. Patient education plan is to attend individual and/or group sessions per assessed needs and concerns.   Plan:   Patient Instructions  Read booklet on Gestational Diabetes Follow Gestational Meal Planning Guidelines Don't skip meals Avoid fruit juices and sugar sweetened drinks Complete a 3 Day Food Record and bring to next appointment Check blood sugars 4 x day - before breakfast and 2 hrs after every meal and record  Bring blood sugar log to all appointments Call MD for prescription for meter strips and lancets Strips   Accu-Chek Guide Lancets   Accu-Chek FastClix Purchase urine ketone strips if blood sugars not controlled and check urine ketones every am:  If + increase bedtime snack to 1 protein and 2 carbohydrate servings Walk 20-30 minutes at least 5 x week if permitted by MD  Expected Outcomes:  Demonstrated interest in learning. Expect positive outcomes  Education material provided:  Gestational Booklet Gestational Meal Planning Guidelines Simple Meal Plan Viewed  Gestational Diabetes Video Meter = Accu-Chek Guide 3 Day Food Record Goals for a Healthy Pregnancy  If problems or questions, patient to contact team via:  Sharion Settler, RN, CCM, CDE [phone]  Future DSME appointment:  Jun 03, 2018 with the dietitian

## 2018-06-01 ENCOUNTER — Observation Stay
Admission: EM | Admit: 2018-06-01 | Discharge: 2018-06-01 | Disposition: A | Discharge status: Home / Self Care | Payer: Medicaid Other | Attending: Obstetrics and Gynecology | Admitting: Obstetrics and Gynecology

## 2018-06-01 ENCOUNTER — Other Ambulatory Visit: Payer: Self-pay

## 2018-06-01 DIAGNOSIS — O24410 Gestational diabetes mellitus in pregnancy, diet controlled: Secondary | ICD-10-CM | POA: Insufficient documentation

## 2018-06-01 DIAGNOSIS — O4693 Antepartum hemorrhage, unspecified, third trimester: Secondary | ICD-10-CM | POA: Diagnosis not present

## 2018-06-01 DIAGNOSIS — Z7951 Long term (current) use of inhaled steroids: Secondary | ICD-10-CM | POA: Diagnosis not present

## 2018-06-01 DIAGNOSIS — O99513 Diseases of the respiratory system complicating pregnancy, third trimester: Secondary | ICD-10-CM | POA: Insufficient documentation

## 2018-06-01 DIAGNOSIS — Z79899 Other long term (current) drug therapy: Secondary | ICD-10-CM | POA: Insufficient documentation

## 2018-06-01 DIAGNOSIS — Z3A29 29 weeks gestation of pregnancy: Secondary | ICD-10-CM | POA: Insufficient documentation

## 2018-06-01 DIAGNOSIS — J45909 Unspecified asthma, uncomplicated: Secondary | ICD-10-CM | POA: Insufficient documentation

## 2018-06-01 LAB — WET PREP, GENITAL
Clue Cells Wet Prep HPF POC: NONE SEEN
Sperm: NONE SEEN
Trich, Wet Prep: NONE SEEN

## 2018-06-01 LAB — URINALYSIS, COMPLETE (UACMP) WITH MICROSCOPIC
Bacteria, UA: NONE SEEN
Bilirubin Urine: NEGATIVE
Glucose, UA: 150 mg/dL — AB
Hgb urine dipstick: NEGATIVE
Ketones, ur: 5 mg/dL — AB
Nitrite: NEGATIVE
Protein, ur: 30 mg/dL — AB
Specific Gravity, Urine: 1.03 (ref 1.005–1.030)
pH: 6 (ref 5.0–8.0)

## 2018-06-01 LAB — CHLAMYDIA/NGC RT PCR (ARMC ONLY)
Chlamydia Tr: NOT DETECTED
N gonorrhoeae: NOT DETECTED

## 2018-06-01 MED ORDER — FLUCONAZOLE 50 MG PO TABS
150.0000 mg | ORAL_TABLET | Freq: Once | ORAL | Status: AC
  Administered 2018-06-01: 20:00:00 150 mg via ORAL
  Filled 2018-06-01: qty 1

## 2018-06-01 MED ORDER — CLOTRIMAZOLE 1 % VA CREA: 1.0000 | TOPICAL_CREAM | Freq: Every day | VAGINAL | 1 refills | Status: DC

## 2018-06-01 NOTE — Discharge Summary (Signed)
Morgan Collier is a 18 y.o. female. She is at 3061w1d gestation. Patient's last menstrual period was 11/09/2017. Estimated Date of Delivery: 08/16/18  [facility]   Current pregnancy complicated by:  1. RH Negative- rhogam given 05/01/18 2. Varicella Non-Immune 3. Gestational Diabetes- diet controlled  Chief complaint: cramping and vaginal bleeding  Location: lower abdomen and back pain Onset/timing: 1100 this morning, pain c/w menstrual like cramping.  Duration: constant since this am.  Quality: bright red vaginal bleeding noted on panties, has not needed a pad.  Severity: n/a Aggravating or alleviating conditions: no recent IC for several months.  Associated signs/symptoms: reports vaginal discharge ongoing.  Context: recently dx with GDM- reports good control with diet only.   S: Resting comfortably. .no LOF,  Active fetal movement. Denies: HA, visual changes, SOB, or RUQ/epigastric pain  Maternal Medical History:   Past Medical History:  Diagnosis Date  . Asthma    Seasonal  . Gestational diabetes     Past Surgical History:  Procedure Laterality Date  . INCISION AND DRAINAGE ABSCESS Right 10/10/2015   Procedure: Incision and drainage of complex axillary abscess Excisions of complex hidradenitis on right axilla                            3. Excisional debridement of skin subcutaneous tissue;  Surgeon: Leafy Roiego F Pabon, MD;  [facility];  Service: General;  Laterality: Right;  . WISDOM TOOTH EXTRACTION  2016    No Known Allergies  Prior to Admission medications   Medication Sig Start Date End Date Taking? Authorizing Provider  acetaminophen (TYLENOL) 325 MG tablet Take 2 tablets (650 mg total) by mouth every 4 (four) hours as needed for mild pain or moderate pain (for pain scale < 4  OR  temperature  >/=  100.5 F). Patient not taking: Reported on 05/28/2018 05/01/18   Genia DelHaviland, Margaret, CNM  cetirizine (ZYRTEC) 10 MG tablet Take 10  mg by mouth daily as needed.  04/07/18 04/07/19  [provider]  fluticasone (FLONASE) 50 MCG/ACT nasal spray Place 1 spray into both nostrils daily as needed.  03/25/18   [provider]  Prenatal Vit-Fe Fumarate-FA (PRENATAL MULTIVITAMIN) TABS tablet Take 1 tablet by mouth daily at 12 noon.    [provider]  PROAIR HFA 108 [phone](90 Base) MCG/ACT inhaler Inhale 2 puffs into the lungs every 6 (six) hours as needed. 10/26/15   [provider]  valACYclovir (VALTREX) 500 MG tablet Take 500 mg by mouth 2 (two) times daily as needed.    [provider]      Social History: She  reports that she has never smoked. She has never used smokeless tobacco. She reports that she does not drink alcohol or use drugs.  Family History: family history includes ADD / ADHD in her father; Diabetes in her paternal grandmother; Heart disease in her maternal grandmother; Hypertension in her maternal grandmother and paternal grandmother; Throat cancer in her maternal uncle.   Review of Systems: A full review of systems was performed and negative except as noted in the HPI.     O:  BP 119/74 (BP Location: Left Arm)   Pulse 97   Temp 98.4 F (36.9 C) (Oral)   Resp 16   LMP 11/09/2017    Constitutional: NAD, AAOx3  HE/ENT: extraocular movements grossly intact, moist mucous membranes CV: RRR PULM: nl respiratory effort, CTABL  Abd: gravid, non-tender, non-distended, soft      Ext: Non-tender, Nonedematous   Psych: mood appropriate, speech normal Pelvic: SSE done- erythematous vaginal walls with mod amt creamy white/yellow adherent discharge noted throughout. Cervix visually closed, friable with scant amt bright red bleeding. Wet prep and GC/CT done.     Fetal  monitoring: Cat I Appropriate for GA Baseline: 130bpm Variability: moderate Accelerations: present x >2 Decelerations absent Time  Results:  Computer systems interface is down and computer results  are not available.   + yeast, no clue cells, trich, or sperm Neg for GC and CT per lab tech. UA + moderate leuks, neg nitrites, + 150 glucose, 30 protein, 5 ketones.  A/P: 18 y.o. [redacted]w[redacted]d here for antenatal surveillance for vaginal bleeding  Principle Diagnosis:  Vaginal bleeding, [redacted]wks gestation   Preterm labor: not present.   Fetal Wellbeing: Reassuring Cat 1 tracing with Reactive NST   Diflucan 150mg  today and ordered clotrimazole to pharmacy x 7 days vaginally.  D/c home stable, precautions reviewed, follow-up as scheduled.    Elenora Fender Revel Stellmach, MD 06/01/2018  7:29 PM

## 2018-06-01 NOTE — Discharge Instructions (Signed)
Vaginal Yeast infection, Adult  Vaginal yeast infection is a condition that causes vaginal discharge as well as soreness, swelling, and redness (inflammation) of the vagina. This is a common condition. Some women get this infection frequently. What are the causes? This condition is caused by a change in the normal balance of the yeast (candida) and bacteria that live in the vagina. This change causes an overgrowth of yeast, which causes the inflammation. What increases the risk? The condition is more likely to develop in women who:  Take antibiotic medicines.  Have diabetes.  Take birth control pills.  Are pregnant.  Douche often.  Have a weak body defense system (immune system).  Have been taking steroid medicines for a long time.  Frequently wear tight clothing. What are the signs or symptoms? Symptoms of this condition include:  White, thick, creamy vaginal discharge.  Swelling, itching, redness, and irritation of the vagina. The lips of the vagina (vulva) may be affected as well.  Pain or a burning feeling while urinating.  Pain during sex. How is this diagnosed? This condition is diagnosed based on:  Your medical history.  A physical exam.  A pelvic exam. Your health care provider will examine a sample of your vaginal discharge under a microscope. Your health care provider may send this sample for testing to confirm the diagnosis. How is this treated? This condition is treated with medicine. Medicines may be over-the-counter or prescription. You may be told to use one or more of the following:  Medicine that is taken by mouth (orally).  Medicine that is applied as a cream (topically).  Medicine that is inserted directly into the vagina (suppository). Follow these instructions at home:  Lifestyle  Do not have sex until your health care provider approves. Tell your sex partner that you have a yeast infection. That person should go to his or her health care  provider and ask if they should also be treated.  Do not wear tight clothes, such as pantyhose or tight pants.  Wear breathable cotton underwear. General instructions  Take or apply over-the-counter and prescription medicines only as told by your health care provider.  Eat more yogurt. This may help to keep your yeast infection from returning.  Do not use tampons until your health care provider approves.  Try taking a sitz bath to help with discomfort. This is a warm water bath that is taken while you are sitting down. The water should only come up to your hips and should cover your buttocks. Do this 3-4 times per day or as told by your health care provider.  Do not douche.  If you have diabetes, keep your blood sugar levels under control.  Keep all follow-up visits as told by your health care provider. This is important. Contact a health care provider if:  You have a fever.  Your symptoms go away and then return.  Your symptoms do not get better with treatment.  Your symptoms get worse.  You have new symptoms.  You develop blisters in or around your vagina.  You have blood coming from your vagina and it is not your menstrual period.  You develop pain in your abdomen. Summary  Vaginal yeast infection is a condition that causes discharge as well as soreness, swelling, and redness (inflammation) of the vagina.  This condition is treated with medicine. Medicines may be over-the-counter or prescription.  Take or apply over-the-counter and prescription medicines only as told by your health care provider.  Do not douche.  Do not have sex or use tampons until your health care provider approves.  Contact a health care provider if your symptoms do not get better with treatment or your symptoms go away and then return. This information is not intended to replace advice given to you by your health care provider. Make sure you discuss any questions you have with your health care  provider. Document Released: 10/23/2004 Document Revised: 06/01/2017 Document Reviewed: 06/01/2017 Elsevier Interactive Patient Education  2019 [employer]    Abdominal Pain During Pregnancy  Belly (abdominal) pain is common during pregnancy. There are many possible causes. Most of the time, it is not a serious problem. Other times, it can be a sign that something is wrong with the pregnancy. Always tell your doctor if you have belly pain. Follow these instructions at home:  Do not have sex or put anything in your vagina until your pain goes away completely.  Get plenty of rest until your pain gets better.  Drink enough fluid to keep your pee (urine) pale yellow.  Take over-the-counter and prescription medicines only as told by your doctor.  Keep all follow-up visits as told by your doctor. This is important. Contact a doctor if:  Your pain continues or gets worse after resting.  You have lower belly pain that: ? Comes and goes at regular times. ? Spreads to your back. ? Feels like menstrual cramps.  You have pain or burning when you pee (urinate). Get help right away if:  You have a fever or chills.  You have vaginal bleeding.  You are leaking fluid from your vagina.  You are passing tissue from your vagina.  You throw up (vomit) for more than 24 hours.  You have watery poop (diarrhea) for more than 24 hours.  Your baby is moving less than usual.  You feel very weak or faint.  You have shortness of breath.  You have very bad pain in your upper belly. Summary  Belly (abdominal) pain is common during pregnancy. There are many possible causes.  If you have belly pain during pregnancy, tell your doctor right away.  Keep all follow-up visits as told by your doctor. This is important. This information is not intended to replace advice given to you by your health care provider. Make sure you discuss any questions you have with your health care  provider. Document Released: 01/01/2009 Document Revised: 04/17/2016 Document Reviewed: 04/17/2016 Elsevier Interactive Patient Education  2019 [employer].

## 2018-06-03 ENCOUNTER — Encounter: Payer: Medicaid Other | Admitting: Dietician

## 2018-06-03 ENCOUNTER — Encounter: Payer: Self-pay | Admitting: Dietician

## 2018-06-03 ENCOUNTER — Other Ambulatory Visit: Payer: Self-pay

## 2018-06-03 VITALS — BP 130/44 | Ht 62.0 in | Wt 154.7 lb

## 2018-06-03 DIAGNOSIS — O24410 Gestational diabetes mellitus in pregnancy, diet controlled: Secondary | ICD-10-CM

## 2018-06-03 DIAGNOSIS — O24419 Gestational diabetes mellitus in pregnancy, unspecified control: Secondary | ICD-10-CM | POA: Diagnosis not present

## 2018-06-03 NOTE — Patient Instructions (Signed)
   Limit juice to no more than 4oz at a time; dilute with water or sparkling water.   Keep portions of starchy foods controlled, fist-size (1 cup) or less.  Good job including protein foods with your meals, keep it up!  You can always increase portions of veggies if you need to, to feel satisfied after meals.

## 2018-06-03 NOTE — Progress Notes (Signed)
   Patient's BG record indicates fasting BGs ranging 74-89, and post-meal BGs ranging 100-125.   Patient's food recall indicates regular consumption of fruit juices, otherwise generally balanced meals. She has been sleeping through breakfast recently, but states she will resume eating a small breakfast. Suggested having granola bar or crackers beside her bed if needed.   Provided 1900kcal meal plan, and wrote individualized menus based on patient's food preferences.  Instructed patient on food safety, including avoidance of Listeriosis, and limiting mercury from fish.  Discussed importance of maintaining healthy lifestyle habits to reduce risk of Type 2 DM as well as Gestational DM with any future pregnancies.  Advised patient to use any remaining testing supplies to test some BGs after delivery, and to have BG tested ideally annually, as well as prior to attempting future pregnancies.

## 2018-07-20 LAB — OB RESULTS CONSOLE GC/CHLAMYDIA
Chlamydia: NEGATIVE
Gonorrhea: NEGATIVE

## 2018-07-20 LAB — OB RESULTS CONSOLE GBS: GBS: NEGATIVE

## 2018-07-20 LAB — OB RESULTS CONSOLE RPR: RPR: NONREACTIVE

## 2018-07-20 LAB — OB RESULTS CONSOLE HIV ANTIBODY (ROUTINE TESTING): HIV: NONREACTIVE

## 2018-07-28 ENCOUNTER — Other Ambulatory Visit: Payer: Self-pay | Admitting: Certified Nurse Midwife

## 2018-07-28 NOTE — Progress Notes (Signed)
  Morgan Collier is a 18 y.o. G1P0 female dated by LMP c/w [redacted]w[redacted]d ultrasound on 12/29/2017.     Pregnancy Issues: 1. Rh negative, received Rhogam 05/01/2018 2. Varicella non-immune 3. Gestational diabetes, diet controlled   Prenatal care site: [facility]   EFW: ultrasound on 07/27/2018: 3246g 7lb2oz  Prenatal Labs: Blood type/Rh A neg  Antibody screen neg  Rubella Immune  Varicella Non-immune  RPR NR  HBsAg Neg  HIV NR  GC neg  Chlamydia neg  Genetic screening negative  1 hour GTT 216  GBS negative    Post Partum Planning: - Infant feeding: formula and breast - Contraception: none planned currently

## 2018-08-02 ENCOUNTER — Inpatient Hospital Stay
Admission: EM | Admit: 2018-08-02 | Discharge: 2018-08-04 | DRG: 807 | Disposition: A | Discharge status: Home / Self Care | Payer: Medicaid Other | Attending: Obstetrics and Gynecology | Admitting: Obstetrics and Gynecology

## 2018-08-02 ENCOUNTER — Inpatient Hospital Stay: Payer: Medicaid Other | Admitting: Anesthesiology

## 2018-08-02 ENCOUNTER — Observation Stay
Admission: EM | Admit: 2018-08-02 | Discharge: 2018-08-02 | Disposition: A | Discharge status: Home / Self Care | Payer: Medicaid Other | Source: Home / Self Care | Admitting: Obstetrics & Gynecology

## 2018-08-02 ENCOUNTER — Other Ambulatory Visit: Payer: Self-pay

## 2018-08-02 DIAGNOSIS — O24420 Gestational diabetes mellitus in childbirth, diet controlled: Secondary | ICD-10-CM | POA: Diagnosis present

## 2018-08-02 DIAGNOSIS — Z1159 Encounter for screening for other viral diseases: Secondary | ICD-10-CM

## 2018-08-02 DIAGNOSIS — O43123 Velamentous insertion of umbilical cord, third trimester: Secondary | ICD-10-CM | POA: Diagnosis present

## 2018-08-02 DIAGNOSIS — O479 False labor, unspecified: Secondary | ICD-10-CM | POA: Diagnosis present

## 2018-08-02 DIAGNOSIS — Z6791 Unspecified blood type, Rh negative: Secondary | ICD-10-CM | POA: Diagnosis not present

## 2018-08-02 DIAGNOSIS — Z3A38 38 weeks gestation of pregnancy: Secondary | ICD-10-CM

## 2018-08-02 DIAGNOSIS — O26893 Other specified pregnancy related conditions, third trimester: Secondary | ICD-10-CM | POA: Diagnosis present

## 2018-08-02 DIAGNOSIS — O471 False labor at or after 37 completed weeks of gestation: Secondary | ICD-10-CM | POA: Insufficient documentation

## 2018-08-02 LAB — CBC
HCT: 38.1 % (ref 36.0–46.0)
HCT: 40.2 % (ref 36.0–46.0)
Hemoglobin: 12.1 g/dL (ref 12.0–15.0)
Hemoglobin: 12.8 g/dL (ref 12.0–15.0)
MCH: 22.9 pg — ABNORMAL LOW (ref 26.0–34.0)
MCH: 22.9 pg — ABNORMAL LOW (ref 26.0–34.0)
MCHC: 31.8 g/dL (ref 30.0–36.0)
MCHC: 31.8 g/dL (ref 30.0–36.0)
MCV: 72.2 fL — ABNORMAL LOW (ref 80.0–100.0)
MCV: 72 fL — ABNORMAL LOW (ref 80.0–100.0)
Platelets: 240 10*3/uL (ref 150–400)
Platelets: 258 10*3/uL (ref 150–400)
RBC: 5.28 MIL/uL — ABNORMAL HIGH (ref 3.87–5.11)
RBC: 5.58 MIL/uL — ABNORMAL HIGH (ref 3.87–5.11)
RDW: 15.5 % (ref 11.5–15.5)
RDW: 15.6 % — ABNORMAL HIGH (ref 11.5–15.5)
WBC: 9.5 10*3/uL (ref 4.0–10.5)
WBC: 11.8 10*3/uL — ABNORMAL HIGH (ref 4.0–10.5)
nRBC: 0 % (ref 0.0–0.2)
nRBC: 0 % (ref 0.0–0.2)

## 2018-08-02 LAB — PROTEIN / CREATININE RATIO, URINE
Creatinine, Urine: 219 mg/dL
Protein Creatinine Ratio: 0.09 mg/mg{Cre} (ref 0.00–0.15)
Total Protein, Urine: 19 mg/dL

## 2018-08-02 LAB — COMPREHENSIVE METABOLIC PANEL
ALT: 16 U/L (ref 0–44)
AST: 18 U/L (ref 15–41)
Albumin: 3 g/dL — ABNORMAL LOW (ref 3.5–5.0)
Alkaline Phosphatase: 126 U/L (ref 38–126)
Anion gap: 9 (ref 5–15)
BUN: 7 mg/dL (ref 6–20)
CO2: 19 mmol/L — ABNORMAL LOW (ref 22–32)
Calcium: 8.7 mg/dL — ABNORMAL LOW (ref 8.9–10.3)
Chloride: 107 mmol/L (ref 98–111)
Creatinine, Ser: 0.62 mg/dL (ref 0.44–1.00)
GFR calc Af Amer: >60 mL/min (ref >60)
GFR calc non Af Amer: >60 mL/min (ref >60)
Glucose, Bld: 166 mg/dL — ABNORMAL HIGH (ref 70–99)
Potassium: 3.6 mmol/L (ref 3.5–5.1)
Sodium: 135 mmol/L (ref 135–145)
Total Bilirubin: 0.2 mg/dL — ABNORMAL LOW (ref 0.3–1.2)
Total Protein: 6.4 g/dL — ABNORMAL LOW (ref 6.5–8.1)

## 2018-08-02 LAB — GLUCOSE, CAPILLARY: Glucose-Capillary: 70 mg/dL (ref 70–99)

## 2018-08-02 LAB — TYPE AND SCREEN
ABO/RH(D): A NEG
Antibody Screen: NEGATIVE

## 2018-08-02 LAB — SARS CORONAVIRUS 2 BY RT PCR (HOSPITAL ORDER, PERFORMED IN ~~LOC~~ HOSPITAL LAB): SARS Coronavirus 2: NEGATIVE

## 2018-08-02 MED ORDER — EPHEDRINE 5 MG/ML INJ
10.0000 mg | INTRAVENOUS | Status: DC | PRN
  Filled 2018-08-02: qty 2

## 2018-08-02 MED ORDER — SOD CITRATE-CITRIC ACID 500-334 MG/5ML PO SOLN: 30.0000 mL | ORAL | Status: DC | PRN

## 2018-08-02 MED ORDER — LACTATED RINGERS IV SOLN: 500.0000 mL | INTRAVENOUS | Status: DC | PRN

## 2018-08-02 MED ORDER — ACETAMINOPHEN 325 MG PO TABS: 650.0000 mg | ORAL_TABLET | ORAL | Status: DC | PRN

## 2018-08-02 MED ORDER — TERBUTALINE SULFATE 1 MG/ML IJ SOLN: 0.2500 mg | Freq: Once | INTRAMUSCULAR | Status: DC | PRN

## 2018-08-02 MED ORDER — OXYTOCIN 40 UNITS IN NORMAL SALINE INFUSION - SIMPLE MED
2.5000 [IU]/h | INTRAVENOUS | Status: DC
  Filled 2018-08-02 (×2): qty 1000

## 2018-08-02 MED ORDER — BUTORPHANOL TARTRATE 1 MG/ML IJ SOLN: 1.0000 mg | INTRAMUSCULAR | Status: DC | PRN

## 2018-08-02 MED ORDER — LIDOCAINE HCL (PF) 1 % IJ SOLN: 30.0000 mL | INTRAMUSCULAR | Status: DC | PRN

## 2018-08-02 MED ORDER — LACTATED RINGERS IV SOLN: 500.0000 mL | Freq: Once | INTRAVENOUS | Status: DC

## 2018-08-02 MED ORDER — ONDANSETRON HCL 4 MG/2ML IJ SOLN: 4.0000 mg | Freq: Four times a day (QID) | INTRAMUSCULAR | Status: DC | PRN

## 2018-08-02 MED ORDER — OXYCODONE-ACETAMINOPHEN 5-325 MG PO TABS: 2.0000 | ORAL_TABLET | ORAL | Status: DC | PRN

## 2018-08-02 MED ORDER — OXYCODONE-ACETAMINOPHEN 5-325 MG PO TABS: 1.0000 | ORAL_TABLET | ORAL | Status: DC | PRN

## 2018-08-02 MED ORDER — OXYTOCIN 40 UNITS IN NORMAL SALINE INFUSION - SIMPLE MED
2.5000 [IU]/h | INTRAVENOUS | Status: DC
  Administered 2018-08-03: 2.5 [IU]/h via INTRAVENOUS

## 2018-08-02 MED ORDER — FENTANYL 2.5 MCG/ML W/ROPIVACAINE 0.15% IN NS 100 ML EPIDURAL (ARMC)
EPIDURAL | Status: AC
  Filled 2018-08-02: qty 100

## 2018-08-02 MED ORDER — FLEET ENEMA 7-19 GM/118ML RE ENEM: 1.0000 | ENEMA | Freq: Once | RECTAL | Status: DC

## 2018-08-02 MED ORDER — FENTANYL 2.5 MCG/ML W/ROPIVACAINE 0.15% IN NS 100 ML EPIDURAL (ARMC)
12.0000 mL/h | EPIDURAL | Status: DC
  Administered 2018-08-02 – 2018-08-03 (×2): 12 mL/h via EPIDURAL
  Filled 2018-08-02 (×2): qty 100

## 2018-08-02 MED ORDER — SODIUM CHLORIDE 0.9 % IV SOLN
INTRAVENOUS | Status: DC | PRN
  Administered 2018-08-02 (×2): 5 mL via EPIDURAL

## 2018-08-02 MED ORDER — PHENYLEPHRINE 40 MCG/ML (10ML) SYRINGE FOR IV PUSH (FOR BLOOD PRESSURE SUPPORT)
80.0000 ug | PREFILLED_SYRINGE | INTRAVENOUS | Status: DC | PRN
  Filled 2018-08-02: qty 10

## 2018-08-02 MED ORDER — LIDOCAINE HCL (PF) 1 % IJ SOLN
INTRAMUSCULAR | Status: DC | PRN
  Administered 2018-08-02: 3 mL

## 2018-08-02 MED ORDER — LIDOCAINE-EPINEPHRINE (PF) 1.5 %-1:200000 IJ SOLN
INTRAMUSCULAR | Status: DC | PRN
  Administered 2018-08-02: 3 mL via PERINEURAL

## 2018-08-02 MED ORDER — DIPHENHYDRAMINE HCL 50 MG/ML IJ SOLN: 12.5000 mg | INTRAMUSCULAR | Status: DC | PRN

## 2018-08-02 MED ORDER — LACTATED RINGERS IV SOLN
INTRAVENOUS | Status: DC
  Administered 2018-08-02 – 2018-08-03 (×2): via INTRAVENOUS

## 2018-08-02 MED ORDER — OXYTOCIN BOLUS FROM INFUSION
500.0000 mL | Freq: Once | INTRAVENOUS | Status: AC
  Administered 2018-08-03: 500 mL via INTRAVENOUS

## 2018-08-02 MED ORDER — LACTATED RINGERS IV SOLN: INTRAVENOUS | Status: DC

## 2018-08-02 MED ORDER — OXYTOCIN BOLUS FROM INFUSION: 500.0000 mL | Freq: Once | INTRAVENOUS | Status: DC

## 2018-08-02 MED ORDER — LIDOCAINE HCL (PF) 1 % IJ SOLN
30.0000 mL | INTRAMUSCULAR | Status: AC | PRN
  Administered 2018-08-03: 30 mL via SUBCUTANEOUS

## 2018-08-02 NOTE — OB Triage Note (Signed)
Pt presents from ED with c/o urge to push and LOF. Cervical check on arrival to unit 5.5/100/-1 with BBOW. Monitors applied and initial fht 150 with monitors applied and assessing.

## 2018-08-02 NOTE — OB Triage Note (Signed)
Pt presents stating "my mama made me come because my mucous plug came out, and then I started having really bad contractions after that around 5:00am". Pt denies recent intercourse. Reports positive fetal movement. Pt vitals WNL but BP is slightly elevated. BP cycling q76mins. Denies HA, blurry vision, epigastric pain. No swelling, No clonus, +2 reflexes. Will continue to monitor.

## 2018-08-02 NOTE — Discharge Summary (Signed)
TRIAGE VISIT with NST   Morgan Collier is a 18 y.o. G1P0. She is at [redacted]w[redacted]d gestation, presenting with signs of labor.  Indication: Contractions  S: Resting comfortably. CTX have resolved, no VB. Active fetal movement.  O:  BP 117/61 (BP Location: Right Arm)   Pulse 86   Temp 99 F (37.2 C) (Oral)   Resp 20   Ht 5\' 2"  (1.575 m)   Wt 78.5 kg   LMP 11/09/2017   BMI 31.64 kg/m  Results for orders placed or performed during the hospital encounter of 08/02/18 (from the past 48 hour(s))  Protein / creatinine ratio, urine   Collection Time: 08/02/18  6:19 AM  Result Value Ref Range   Creatinine, Urine 219 mg/dL   Total Protein, Urine 19 mg/dL   Protein Creatinine Ratio 0.09 0.00 - 0.15 mg/mg[Cre]  CBC   Collection Time: 08/02/18  6:24 AM  Result Value Ref Range   WBC 9.5 4.0 - 10.5 K/uL   RBC 5.28 (H) 3.87 - 5.11 MIL/uL   Hemoglobin 12.1 12.0 - 15.0 g/dL   HCT 38.1 36.0 - 46.0 %   MCV 72.2 (L) 80.0 - 100.0 fL   MCH 22.9 (L) 26.0 - 34.0 pg   MCHC 31.8 30.0 - 36.0 g/dL   RDW 15.5 11.5 - 15.5 %   Platelets 240 150 - 400 K/uL   nRBC 0.0 0.0 - 0.2 %  Comprehensive metabolic panel   Collection Time: 08/02/18  6:24 AM  Result Value Ref Range   Sodium 135 135 - 145 mmol/L   Potassium 3.6 3.5 - 5.1 mmol/L   Chloride 107 98 - 111 mmol/L   CO2 19 (L) 22 - 32 mmol/L   Glucose, Bld 166 (H) 70 - 99 mg/dL   BUN 7 6 - 20 mg/dL   Creatinine, Ser 0.62 0.44 - 1.00 mg/dL   Calcium 8.7 (L) 8.9 - 10.3 mg/dL   Total Protein 6.4 (L) 6.5 - 8.1 g/dL   Albumin 3.0 (L) 3.5 - 5.0 g/dL   AST 18 15 - 41 U/L   ALT 16 0 - 44 U/L   Alkaline Phosphatase 126 38 - 126 U/L   Total Bilirubin 0.2 (L) 0.3 - 1.2 mg/dL   GFR calc non Af Amer >60 >60 mL/min   GFR calc Af Amer >60 >60 mL/min   Anion gap 9 5 - 15  Type and screen   Collection Time: 08/02/18  6:24 AM  Result Value Ref Range   ABO/RH(D) A NEG    Antibody Screen NEG    Sample Expiration      08/05/2018,2359 Performed at [facility], [address] [place], [place] [address]      Gen: NAD, AAOx3      Abd: FNTTP      Ext: Non-tender, Nonedmeatous    NST  Baseline: 130 Variability: moderate Accelerations present x >2 Decelerations absent Time 11mins  Interpretation: reactive NST, category 1 tracing  ----- Benjaman Kindler, MD MPH Attending Obstetrician and Gynecologist [facility], Department of OB/GYN [facility]    TOCO: rare contractions [id number]: 3.5 Effacement (%): 60 Cervical Position: Posterior Station: -3 Presentation: Undeterminable Exam by:: Laurell Roof, RN  NST: Reactive. See FHT above for particulars.  A/P:  18 y.o. G1P0 [redacted]w[redacted]d with contractions that have decreased in intensity, and no cervical change.   Labor: not present.   Fetal Wellbeing: NST reactive Reassuring Cat 1 tracing.  D/c home stable, precautions reviewed, follow-up as scheduled.

## 2018-08-02 NOTE — Anesthesia Preprocedure Evaluation (Signed)
Anesthesia Evaluation  Patient identified by MRN, date of birth, ID band Patient awake    Reviewed: Allergy & Precautions, H&P , NPO status , Patient's Chart, lab work & pertinent test results, reviewed documented beta blocker date and time   History of Anesthesia Complications Negative for: history of anesthetic complications  Airway Mallampati: III  TM Distance: >3 FB Neck ROM: full    Dental no notable dental hx.    Pulmonary neg shortness of breath, asthma , neg recent URI,           Cardiovascular Exercise Tolerance: Good negative cardio ROS       Neuro/Psych negative neurological ROS  negative psych ROS   GI/Hepatic Neg liver ROS, GERD  ,  Endo/Other  diabetes, Well Controlled, Gestational  Renal/GU negative Renal ROS  negative genitourinary   Musculoskeletal   Abdominal   Peds  Hematology negative hematology ROS (+)   Anesthesia Other Findings Past Medical History: No date: Asthma     Comment:  Seasonal No date: Gestational diabetes   Reproductive/Obstetrics (+) Pregnancy                             Anesthesia Physical Anesthesia Plan  ASA: II  Anesthesia Plan: Epidural   Post-op Pain Management:    Induction:   PONV Risk Score and Plan:   Airway Management Planned:   Additional Equipment:   Intra-op Plan:   Post-operative Plan:   Informed Consent: I have reviewed the patients History and Physical, chart, labs and discussed the procedure including the risks, benefits and alternatives for the proposed anesthesia with the patient or authorized representative who has indicated his/her understanding and acceptance.     Dental Advisory Given  Plan Discussed with: Anesthesiologist, CRNA and Surgeon  Anesthesia Plan Comments:         Anesthesia Quick Evaluation

## 2018-08-02 NOTE — Anesthesia Procedure Notes (Signed)
Epidural Patient location during procedure: OB Start time: 08/02/2018 10:20 PM End time: 08/02/2018 10:28 PM  Staffing Anesthesiologist: Martha Clan, MD Performed: anesthesiologist   Preanesthetic Checklist Completed: patient identified, site marked, surgical consent, pre-op evaluation, timeout performed, IV checked, risks and benefits discussed and monitors and equipment checked  Epidural Patient position: sitting Prep: ChloraPrep Patient monitoring: heart rate, continuous pulse ox and blood pressure Approach: midline Location: L3-L4 Injection technique: LOR saline  Needle:  Needle type: Tuohy  Needle gauge: 17 G Needle length: 9 cm and 9 Needle insertion depth: 6 cm Catheter type: closed end flexible Catheter size: 19 Gauge Catheter at skin depth: 11 cm Test dose: negative and 1.5% lidocaine with Epi 1:200 K  Assessment Sensory level: T10 Events: blood not aspirated, injection not painful, no injection resistance, negative IV test and no paresthesia  Additional Notes 1st attempt Pt. Evaluated and documentation done after procedure finished. Patient identified. Risks/Benefits/Options discussed with patient including but not limited to bleeding, infection, nerve damage, paralysis, failed block, incomplete pain control, headache, blood pressure changes, nausea, vomiting, reactions to medication both or allergic, itching and postpartum back pain. Confirmed with bedside nurse the patient's most recent platelet count. Confirmed with patient that they are not currently taking any anticoagulation, have any bleeding history or any family history of bleeding disorders. Patient expressed understanding and wished to proceed. All questions were answered. Sterile technique was used throughout the entire procedure. Please see nursing notes for vital signs. Test dose was given through epidural catheter and negative prior to continuing to dose epidural or start infusion. Warning signs of high  block given to the patient including shortness of breath, tingling/numbness in hands, complete motor block, or any concerning symptoms with instructions to call for help. Patient was given instructions on fall risk and not to get out of bed. All questions and concerns addressed with instructions to call with any issues or inadequate analgesia.   Patient tolerated the insertion well without immediate complications.Reason for block:procedure for pain

## 2018-08-02 NOTE — H&P (Signed)
OB ADMISSION/ HISTORY & PHYSICAL:  Admission Date: 08/02/2018  7:42 PM  Admit Diagnosis: Active labor  Morgan Collier is a 18 y.o. G1P0 presenting for contractions.  Prenatal History: G1P0   EDC : 08/16/2018, by Last Menstrual Period  [facility]   Pregnancy Issues: 1. Rh negative, received Rhogam 05/01/2018 2. Varicella non-immune 3. Gestational diabetes, diet controlled  Medical / Surgical History :  Past medical history:  Past Medical History:  Diagnosis Date  . Asthma    Seasonal  . Gestational diabetes      Past surgical history:  Past Surgical History:  Procedure Laterality Date  . INCISION AND DRAINAGE ABSCESS Right 10/10/2015   Procedure: Incision and drainage of complex axillary abscess Excisions of complex hidradenitis on right axilla                            3. Excisional debridement of skin subcutaneous tissue;  Surgeon: Leafy Roiego F Pabon, MD;  [facility];  Service: General;  Laterality: Right;  . WISDOM TOOTH EXTRACTION  2016    Family History:  Family History  Problem Relation Age of Onset  . Throat cancer Maternal Uncle   . Heart disease Maternal Grandmother   . Hypertension Maternal Grandmother   . ADD / ADHD Father   . Hypertension Paternal Grandmother   . Diabetes Paternal Grandmother      Social History:  reports that she has never smoked. She has never used smokeless tobacco. She reports that she does not drink alcohol or use drugs.   Allergies: Patient has no known allergies.    Current Medications at time of admission:  Prior to Admission medications   Medication Sig Start Date End Date Taking? Authorizing Provider  fluticasone (FLONASE) 50 MCG/ACT nasal spray Place 1 spray into both nostrils daily as needed.  03/25/18  Yes [provider]  glucose blood (ACCU-CHEK COMPACT PLUS) test strip Use 4 (four) times daily Use as instructed. 05/31/18  Yes [provider]  Lancets 28G MISC  Use 1 each 4 (four) times daily 05/31/18 05/31/19 Yes [provider]  Prenatal Vit-Fe Fumarate-FA (PRENATAL MULTIVITAMIN) TABS tablet Take 1 tablet by mouth daily at 12 noon.   Yes [provider]  PROAIR HFA 108 (90 Base) MCG/ACT inhaler Inhale 2 puffs into the lungs every 6 (six) hours as needed. 10/26/15  Yes [provider]  cetirizine (ZYRTEC) 10 MG tablet Take 10 mg by mouth daily as needed.  04/07/18 04/07/19  [provider]  clotrimazole (CLOTRIMAZOLE-7) 1 % vaginal cream Place 1 Applicatorful vaginally at bedtime. Patient not taking: Reported on 08/02/2018 06/01/18   Ward, Elenora Fenderhelsea C, MD  valACYclovir (VALTREX) 500 MG tablet Take 500 mg by mouth 2 (two) times daily as needed.    [provider]     Review of Systems: Active FM onset of ctx yesterday  Physical Exam:  VS: Blood pressure 139/69, pulse 83, temperature 98.8 F (37.1 C), temperature source Oral, resp. rate 18, height 5\' 2"  (1.575 m), weight 78.5 kg, last menstrual period 11/09/2017, SpO2 100 %.  General: alert and oriented, appears NAD Heart: RRR Lungs: Clear lung fields Abdomen: Gravid, soft and non-tender, non-distended Extremities: No edema  FHT: 140, moderate variability, +accels, no decels TOCO:q2 SVE:  Dilation: 5 / Effacement (%): 100 / Station: -1    Cephalic by leopolds EFW: ultrasound on 07/27/2018: 3246g 7lb2oz  Prenatal Labs: Blood type/Rh --/--/A  NEG (07/06 4888)  Antibody screen neg  Rubella Immune  Varicella Immune  RPR NR  HBsAg Neg  HIV NR  GC neg  Chlamydia neg  Genetic screening negative  1 hour GTT 216  3 hour GTT n/a  GBS negative   No results found.  Assessment: 38+[redacted] weeks gestation 1 stage of labor FHR category 1   Plan:  Admit for active labor Labs pending Epidural when desired Continuous fetal monitoring Gestational diabetes: CBG on admit 70  1. Fetal Well being  - Fetal Tracing: cat i - Ultrasound:  reviewed, as above -  Group B Streptococcus: neg - Presentation: vtx confirmed by Leopolds   2. Routine OB: - Prenatal labs reviewed, as above - Rh A neg  3. Post Partum Planning: - Infant feeding: formula and breast - Contraception: none planned currently

## 2018-08-03 LAB — RPR: RPR Ser Ql: NONREACTIVE

## 2018-08-03 MED ORDER — PRENATAL MULTIVITAMIN CH
1.0000 | ORAL_TABLET | Freq: Every day | ORAL | Status: DC
  Administered 2018-08-04: 1 via ORAL
  Filled 2018-08-03: qty 1

## 2018-08-03 MED ORDER — VARICELLA VIRUS VACCINE LIVE 1350 PFU/0.5ML IJ SUSR: 0.5000 mL | INTRAMUSCULAR | Status: DC | PRN

## 2018-08-03 MED ORDER — BENZOCAINE-MENTHOL 20-0.5 % EX AERO
1.0000 "application " | INHALATION_SPRAY | CUTANEOUS | Status: DC | PRN
  Filled 2018-08-03 (×2): qty 56

## 2018-08-03 MED ORDER — WITCH HAZEL-GLYCERIN EX PADS
1.0000 "application " | MEDICATED_PAD | CUTANEOUS | Status: DC
  Administered 2018-08-03: 1 via TOPICAL
  Filled 2018-08-03: qty 100

## 2018-08-03 MED ORDER — ONDANSETRON HCL 4 MG/2ML IJ SOLN: 4.0000 mg | INTRAMUSCULAR | Status: DC | PRN

## 2018-08-03 MED ORDER — COCONUT OIL OIL
1.0000 "application " | TOPICAL_OIL | Status: DC | PRN
  Filled 2018-08-03: qty 120

## 2018-08-03 MED ORDER — HYDROCORTISONE (PERIANAL) 2.5 % EX CREA
TOPICAL_CREAM | CUTANEOUS | Status: DC
  Administered 2018-08-03: 21:00:00 via TOPICAL
  Filled 2018-08-03: qty 28.35

## 2018-08-03 MED ORDER — OXYTOCIN 40 UNITS IN NORMAL SALINE INFUSION - SIMPLE MED
1.0000 m[IU]/min | INTRAVENOUS | Status: DC
  Administered 2018-08-03: 2 m[IU]/min via INTRAVENOUS
  Filled 2018-08-03: qty 1000

## 2018-08-03 MED ORDER — DOCUSATE SODIUM 100 MG PO CAPS
100.0000 mg | ORAL_CAPSULE | Freq: Two times a day (BID) | ORAL | Status: DC
  Administered 2018-08-03 – 2018-08-04 (×2): 100 mg via ORAL
  Filled 2018-08-03 (×2): qty 1

## 2018-08-03 MED ORDER — DIPHENHYDRAMINE HCL 25 MG PO CAPS: 25.0000 mg | ORAL_CAPSULE | Freq: Four times a day (QID) | ORAL | Status: DC | PRN

## 2018-08-03 MED ORDER — IBUPROFEN 600 MG PO TABS
600.0000 mg | ORAL_TABLET | Freq: Four times a day (QID) | ORAL | Status: DC
  Administered 2018-08-03 – 2018-08-04 (×3): 600 mg via ORAL
  Filled 2018-08-03 (×3): qty 1

## 2018-08-03 MED ORDER — ONDANSETRON HCL 4 MG PO TABS: 4.0000 mg | ORAL_TABLET | ORAL | Status: DC | PRN

## 2018-08-03 MED ORDER — SIMETHICONE 80 MG PO CHEW: 80.0000 mg | CHEWABLE_TABLET | ORAL | Status: DC | PRN

## 2018-08-03 MED ORDER — ACETAMINOPHEN 500 MG PO TABS
1000.0000 mg | ORAL_TABLET | Freq: Four times a day (QID) | ORAL | Status: DC | PRN
  Administered 2018-08-03 – 2018-08-04 (×3): 1000 mg via ORAL
  Filled 2018-08-03 (×3): qty 2

## 2018-08-03 NOTE — Progress Notes (Signed)
Morgan Collier is a 18 y.o. G1P0 at [redacted]w[redacted]d by LMP admitted for active labor  Subjective: Comfortable with epidural  Objective: BP 130/65   Pulse 100   Temp 98.5 F (36.9 C) (Oral)   Resp 18   Ht 5\' 2"  (1.575 m)   Wt 78.5 kg   LMP 11/09/2017   SpO2 100%   BMI 31.64 kg/m  No intake/output data recorded. Total I/O In: 1019.1 [P.O.:240; I.V.:779.1] Out: -   FHT:  FHR: 140 bpm, variability: moderate,  accelerations:  Present,  decelerations:  Present intermittent variable UC:   regular, every 2 minutes SVE:   Dilation: 7.5 Effacement (%): 70 Station: -1 Exam by:: A. White, RN  On pitocin 66mu/min  Labs: Lab Results  Component Value Date   WBC 11.8 (H) 08/02/2018   HGB 12.8 08/02/2018   HCT 40.2 08/02/2018   MCV 72.0 (L) 08/02/2018   PLT 258 08/02/2018    Assessment / Plan: 18yo G1P0 at 38+1wks in active labor with augmentation. AROM for clear fluid at 2300 last night Pitocin started at 2am for active phase protraction. She was 7cm at 2300.   Benjaman Kindler 08/03/2018, 4:30 AM

## 2018-08-03 NOTE — Lactation Note (Signed)
This note was copied from a baby's chart. Lactation Consultation Note  Patient Name: Morgan Collier [id number] Date: 08/03/2018 Reason for consult: Initial assessment   Maternal Data Has patient been taught Hand Expression?: Yes Does the patient have breastfeeding experience prior to this delivery?: No  Feeding Feeding Type: Breast Fed  LATCH Score Latch: Too sleepy or reluctant, no latch achieved, no sucking elicited.  Audible Swallowing: None  Type of Nipple: Everted at rest and after stimulation  Comfort (Breast/Nipple): Soft / non-tender  Hold (Positioning): Full assist, staff holds infant at breast  LATCH Score: 4  Interventions Interventions: Breast feeding basics reviewed;Assisted with latch;Skin to skin  Lactation Tools Discussed/Used     Consult Status Consult Status: Follow-up Date: 08/04/18 Follow-up type: In-patient  LC received report from baby's nurse that baby is very sleepy and had been left skin to skin with mother. LCs reviewed breastfeeding information with MOB and discussed positioning, latch, feeding cues and clusterfeeding with MOB.   LC attempted to get baby latched onto mom's breasts but baby was too tired to respond. LCs assured MOB that baby will eat and to keep doing skin to skin and offer the breast when baby begins to show signs of hunger.  Marnee Spring 08/03/2018, 4:23 PM

## 2018-08-04 LAB — CBC
HCT: 29.9 % — ABNORMAL LOW (ref 36.0–46.0)
Hemoglobin: 9.5 g/dL — ABNORMAL LOW (ref 12.0–15.0)
MCH: 22.9 pg — ABNORMAL LOW (ref 26.0–34.0)
MCHC: 31.8 g/dL (ref 30.0–36.0)
MCV: 72.2 fL — ABNORMAL LOW (ref 80.0–100.0)
Platelets: 184 10*3/uL (ref 150–400)
RBC: 4.14 MIL/uL (ref 3.87–5.11)
RDW: 15.1 % (ref 11.5–15.5)
WBC: 14.6 10*3/uL — ABNORMAL HIGH (ref 4.0–10.5)
nRBC: 0 % (ref 0.0–0.2)

## 2018-08-04 LAB — FETAL SCREEN: Fetal Screen: NEGATIVE

## 2018-08-04 LAB — RPR: RPR Ser Ql: NONREACTIVE

## 2018-08-04 MED ORDER — IBUPROFEN 600 MG PO TABS: 600.0000 mg | ORAL_TABLET | Freq: Four times a day (QID) | ORAL | 0 refills | Status: DC

## 2018-08-04 MED ORDER — ACETAMINOPHEN 500 MG PO TABS: 1000.0000 mg | ORAL_TABLET | Freq: Four times a day (QID) | ORAL | 0 refills | Status: DC | PRN

## 2018-08-04 MED ORDER — RHO D IMMUNE GLOBULIN 1500 UNIT/2ML IJ SOSY
300.0000 ug | PREFILLED_SYRINGE | Freq: Once | INTRAMUSCULAR | Status: AC
  Administered 2018-08-04: 300 ug via INTRAMUSCULAR
  Filled 2018-08-04: qty 2

## 2018-08-04 NOTE — Plan of Care (Signed)
Vs stable; tolerating regular diet; taking tylenol and motrin for pain control; iv was able to be removed this shift; pt was able to get up to void independently this last time; pt is breastfeeding and does need assistance (having to supplement baby with similac neosure 22 cal after breastfeeds due to baby low glucose)

## 2018-08-04 NOTE — Discharge Summary (Signed)
Obstetrical Discharge Summary  Patient Name: Morgan Collier DOB: [birth year] MRN: [id number]  Date of Admission: 08/02/2018 Date of Delivery: 08/03/2018 Delivered by: Ranae Plumberhelsea Ward, MD Date of Discharge: 08/04/2018  [facility]  WUJ:[id number]:Patient's last menstrual period was 11/09/2017. EDC Estimated Date of Delivery: 08/16/18 Gestational Age at Delivery: 7557w2d   Antepartum complications:   Pregnancy Issues: 1.Rh negative, received Rhogam 05/01/2018 2. Varicella non-immune 3. Gestational diabetes, diet controlled  Admitting Diagnosis: labor Secondary Diagnosis: Patient Active Problem List   Diagnosis Date Noted  . Uterine contractions during pregnancy 08/02/2018  . Labor and delivery, indication for care 08/02/2018  . Normal labor and delivery 08/02/2018  . Labor and delivery indication for care or intervention 08/02/2018  . Vaginal bleeding in pregnancy, third trimester 06/01/2018  . Vaginal bleeding in pregnancy, second trimester 05/01/2018  . Vaginal bleeding during pregnancy, antepartum 04/30/2018  . First trimester screening   . Hidradenitis suppurativa of right axilla     Augmentation: Pitocin Complications: None Intrapartum complications/course:  Patient presented in labor and was augmented with pitocin.  Epidural placed.  Second stage, 3 hours, with manual rotation from OP to OA. Nuchal cord tight x 1, reduced after delivery.  Initially baby was flaccid with excellent heartrate but poor respiratory effort; this improved with O2 delivery and time.  He was placed skin to skin.  Spontaneous delivery of placenta, and repair of 2nd degree laceration of the perineum/vagina.   Delivery Type: spontaneous vaginal delivery Anesthesia: epidural Placenta: spontaneous Laceration: 2nd degree perineal Episiotomy: none Newborn Data: Live born female "Nazhir" Birth Weight: 7 lb 2.6 oz (3250 g) APGAR: 4, 7  Newborn Delivery   Birth date/time: 08/03/2018 14:56:00 Delivery  type: Vaginal, Spontaneous      Postpartum Procedures: none  Post partum course:  Patient had an uncomplicated postpartum course.  By time of discharge on PPD#1, her pain was controlled on oral pain medications; she had appropriate lochia and was ambulating, voiding without difficulty and tolerating regular diet.  She was deemed stable for discharge to home.      Discharge Physical Exam:  BP 127/74 (BP Location: Left Arm)   Pulse 84   Temp 98.4 F (36.9 C) (Oral)   Resp 18   Ht 5\' 2"  (1.575 m)   Wt 78.5 kg   LMP 11/09/2017   SpO2 99%   BMI 31.64 kg/m   General: NAD CV: RRR Pulm: CTABL, nl effort ABD: s/nd/nt, fundus firm and below the umbilicus Lochia: moderate DVT Evaluation: LE non-ttp, no evidence of DVT on exam.  Hemoglobin  Date Value Ref Range Status  08/04/2018 9.5 (L) 12.0 - 15.0 g/dL Final    Comment:    REPEATED TO VERIFY   HGB  Date Value Ref Range Status  11/30/2013 12.8 12.0 - 16.0 g/dL Final   HCT  Date Value Ref Range Status  08/04/2018 29.9 (L) 36.0 - 46.0 % Final  11/30/2013 41.2 35.0 - 47.0 % Final     Disposition: stable, discharge to home. Baby Feeding: breastmilk and formula Baby Disposition: home with mom  Rh Immune globulin given:  yes Rubella vaccine given: n/a Flu vaccine given in AP or PP setting: AP  Contraception: natural family planning  Prenatal Labs:   Blood type/Rh --/--/A NEG (07/06 [id number])  Antibody screen neg  Rubella Immune  Varicella Immune  RPR NR  HBsAg Neg  HIV NR  GC neg  Chlamydia neg  Genetic screening negative  1 hour GTT 216  3 hour GTT  n/a  GBS negative     Plan:  Philomina Leon was discharged to home in good condition. Follow-up appointment with Dr. Leonides Schanz in 6 weeks.  Discharge Medications: Allergies as of 08/04/2018   No Known Allergies     Medication List    STOP taking these medications   Accu-Chek Compact Plus test strip Generic drug: glucose blood   clotrimazole 1 % vaginal  cream Commonly known as: Clotrimazole-7   Lancets 28G Misc   valACYclovir 500 MG tablet Commonly known as: VALTREX     TAKE these medications   acetaminophen 500 MG tablet Commonly known as: TYLENOL Take 2 tablets (1,000 mg total) by mouth every 6 (six) hours as needed (for pain scale < 4).   cetirizine 10 MG tablet Commonly known as: ZYRTEC Take 10 mg by mouth daily as needed.   fluticasone 50 MCG/ACT nasal spray Commonly known as: FLONASE Place 1 spray into both nostrils daily as needed.   ibuprofen 600 MG tablet Commonly known as: ADVIL Take 1 tablet (600 mg total) by mouth every 6 (six) hours.   prenatal multivitamin Tabs tablet Take 1 tablet by mouth daily at 12 noon.   ProAir HFA 108 (90 Base) MCG/ACT inhaler Generic drug: albuterol Inhale 2 puffs into the lungs every 6 (six) hours as needed.       Follow-up Information    Ward, Honor Loh, MD Follow up in 6 day(s).   Specialty: Obstetrics and Gynecology Why: for postpartum visit Contact information: [place] [address] [phone]           Signed: ----- Larey Days, MD, [place] Attending Obstetrician and Gynecologist [facility], Department of [facility]

## 2018-08-04 NOTE — Anesthesia Postprocedure Evaluation (Signed)
Anesthesia Post Note  Patient: Morgan Collier  Procedure(s) Performed: AN AD [place]  Patient location during evaluation: Mother Baby Anesthesia Type: Epidural Level of consciousness: awake and alert Pain management: pain level controlled Vital Signs Assessment: post-procedure vital signs reviewed and stable Respiratory status: spontaneous breathing, nonlabored ventilation and respiratory function stable Cardiovascular status: stable Postop Assessment: no headache, no backache and epidural receding Anesthetic complications: no     Last Vitals:  Vitals:   08/03/18 2331 08/04/18 0355  BP: 121/70 124/66  Pulse: 66 80  Resp: 20 16  Temp: 36.9 C 36.7 C  SpO2: 99% 100%    Last Pain:  Vitals:   08/04/18 0355  TempSrc: Oral  PainSc:                  Blima Singer

## 2018-08-04 NOTE — Lactation Note (Signed)
This note was copied from a baby's chart. Lactation Consultation Note  Patient Name: Morgan Collier [id number] Date: 08/04/2018 Reason for consult: Follow-up assessment   Maternal Data    Feeding Feeding Type: Breast Fed  LATCH Score Latch: Repeated attempts needed to sustain latch, nipple held in mouth throughout feeding, stimulation needed to elicit sucking reflex.  Audible Swallowing: Spontaneous and intermittent  Type of Nipple: Everted at rest and after stimulation  Comfort (Breast/Nipple): Soft / non-tender  Hold (Positioning): Assistance needed to correctly position infant at breast and maintain latch.  LATCH Score: 8  Interventions Interventions: Assisted with latch;Hand express;Breast compression;Adjust position;Support pillows;Position options  Lactation Tools Discussed/Used     Consult Status Consult Status: Follow-up Date: 08/04/18 Follow-up type: In-patient  Lactation assisted with latch and positioning of infant for breastfeeding. Mother states infant will not latch well to the left breast. Once infant was showing cues, he was placed on the left breast in the cradle position. Infant was able to latch on. Mother was taught how to do breast compressions to keep infant interested. LC encouraged mother to continue practicing on the left side so that her milk can be established evenly. Mother was given information on breastfeeding support group and lactation outpatient consultation upon discharge.   Elvera Lennox 02/02/735, 12:41 PM

## 2018-08-04 NOTE — Progress Notes (Signed)
DC to home.  To car via [place] holding newborn.

## 2018-08-04 NOTE — Discharge Instructions (Signed)
Discharge instructions:  ° °Call office if you have any of the following: headache, visual changes, fever >101.0 F, chills, shortness of breath, breast concerns, excessive vaginal bleeding, incision drainage or problems, leg pain or redness, depression or any other concerns.  ° °Activity: Do not lift > 10 lbs for 6 weeks.  °No intercourse or tampons for 6 weeks.  °No driving for 1-2 weeks.  ° °Call your doctor for increased pain or vaginal bleeding, temperature above 101.0, depression, or concerns.  No strenuous activity or heavy lifting for 6 weeks.  No intercourse, tampons, douching, or enemas for 6 weeks.  No tub baths-showers only.  No driving for 2 weeks or while taking pain medications.  Continue prenatal vitamin and iron.  Increase calories and fluids while breastfeeding. ° °You may have a slight fever when your milk comes in, but it should go away on its own.  If it does not, and rises above 101.0 please call the doctor. ° °For concerns about your baby, please call your pediatrician °For breastfeeding concerns, the lactation consultant can be reached at [phone] ° ° °

## 2018-08-05 LAB — RHOGAM INJECTION: Unit division: 0

## 2018-09-12 ENCOUNTER — Emergency Department
Admission: EM | Admit: 2018-09-12 | Discharge: 2018-09-12 | Disposition: A | Discharge status: Home / Self Care | Payer: Medicaid Other | Attending: Emergency Medicine | Admitting: Emergency Medicine

## 2018-09-12 ENCOUNTER — Encounter: Payer: Self-pay | Admitting: Intensive Care

## 2018-09-12 ENCOUNTER — Emergency Department: Payer: Medicaid Other

## 2018-09-12 ENCOUNTER — Other Ambulatory Visit: Payer: Self-pay

## 2018-09-12 DIAGNOSIS — N939 Abnormal uterine and vaginal bleeding, unspecified: Secondary | ICD-10-CM | POA: Insufficient documentation

## 2018-09-12 DIAGNOSIS — J45909 Unspecified asthma, uncomplicated: Secondary | ICD-10-CM | POA: Insufficient documentation

## 2018-09-12 DIAGNOSIS — Z79899 Other long term (current) drug therapy: Secondary | ICD-10-CM | POA: Diagnosis not present

## 2018-09-12 DIAGNOSIS — R102 Pelvic and perineal pain: Secondary | ICD-10-CM | POA: Insufficient documentation

## 2018-09-12 LAB — COMPREHENSIVE METABOLIC PANEL
ALT: 12 U/L (ref 0–44)
AST: 14 U/L — ABNORMAL LOW (ref 15–41)
Albumin: 4.2 g/dL (ref 3.5–5.0)
Alkaline Phosphatase: 98 U/L (ref 38–126)
Anion gap: 7 (ref 5–15)
BUN: 14 mg/dL (ref 6–20)
CO2: 24 mmol/L (ref 22–32)
Calcium: 9.4 mg/dL (ref 8.9–10.3)
Chloride: 104 mmol/L (ref 98–111)
Creatinine, Ser: 0.72 mg/dL (ref 0.44–1.00)
GFR calc Af Amer: >60 mL/min (ref >60)
GFR calc non Af Amer: >60 mL/min (ref >60)
Glucose, Bld: 96 mg/dL (ref 70–99)
Potassium: 4.3 mmol/L (ref 3.5–5.1)
Sodium: 135 mmol/L (ref 135–145)
Total Bilirubin: 0.5 mg/dL (ref 0.3–1.2)
Total Protein: 7.7 g/dL (ref 6.5–8.1)

## 2018-09-12 LAB — CBC WITH DIFFERENTIAL/PLATELET
Abs Immature Granulocytes: 0.01 10*3/uL (ref 0.00–0.07)
Basophils Absolute: 0 10*3/uL (ref 0.0–0.1)
Basophils Relative: 1 %
Eosinophils Absolute: 0.1 10*3/uL (ref 0.0–0.5)
Eosinophils Relative: 2 %
HCT: 38.8 % (ref 36.0–46.0)
Hemoglobin: 12 g/dL (ref 12.0–15.0)
Immature Granulocytes: 0 %
Lymphocytes Relative: 43 %
Lymphs Abs: 2.5 10*3/uL (ref 0.7–4.0)
MCH: 22.4 pg — ABNORMAL LOW (ref 26.0–34.0)
MCHC: 30.9 g/dL (ref 30.0–36.0)
MCV: 72.5 fL — ABNORMAL LOW (ref 80.0–100.0)
Monocytes Absolute: 0.4 10*3/uL (ref 0.1–1.0)
Monocytes Relative: 6 %
Neutro Abs: 2.8 10*3/uL (ref 1.7–7.7)
Neutrophils Relative %: 48 %
Platelets: 255 10*3/uL (ref 150–400)
RBC: 5.35 MIL/uL — ABNORMAL HIGH (ref 3.87–5.11)
RDW: 14.6 % (ref 11.5–15.5)
WBC: 5.9 10*3/uL (ref 4.0–10.5)
nRBC: 0 % (ref 0.0–0.2)

## 2018-09-12 NOTE — ED Notes (Signed)
First Nurse Note: Pt to ED via POV, pt states that she is having vaginal bleeding. Pt gave birth on 08/03/06

## 2018-09-12 NOTE — ED Notes (Signed)
Patient transported to Ultrasound 

## 2018-09-12 NOTE — ED Notes (Signed)
Pt resting comfortably in bed. Pt up to restroom independently. Pt c/o dizziness with walking. BP checked. Pt updated on waiting for [place] to be read.

## 2018-09-12 NOTE — ED Notes (Signed)
signature pad not working. Pt verbalizes understanding to d/c info.

## 2018-09-12 NOTE — ED Provider Notes (Signed)
[facility] Emergency Department Provider Note   ____________________________________________    I have reviewed the triage vital signs and the nursing notes.   HISTORY  Chief Complaint Vaginal Bleeding and Pelvic Pain     HPI Morgan Collier is a 18 y.o. female who presents with complaints of vaginal bleeding and mild pelvic cramping.  Patient reports symptom started about 4 days ago, she felt that this was likely return of menstrual cycle status post vaginal delivery on 08/03/2018.  Has been doing well since delivery lochia had resolved, had had intercourse recently without pain.  This was her first pregnancy.  No fevers or chills.  No nausea or vomiting.  Does describe passage of some clots   Past Medical History:  Diagnosis Date  . Asthma    Seasonal  . Gestational diabetes     Patient Active Problem List   Diagnosis Date Noted  . Uterine contractions during pregnancy 08/02/2018  . Labor and delivery, indication for care 08/02/2018  . Normal labor and delivery 08/02/2018  . Labor and delivery indication for care or intervention 08/02/2018  . Vaginal bleeding in pregnancy, third trimester 06/01/2018  . Vaginal bleeding in pregnancy, second trimester 05/01/2018  . Vaginal bleeding during pregnancy, antepartum 04/30/2018  . First trimester screening   . Hidradenitis suppurativa of right axilla     Past Surgical History:  Procedure Laterality Date  . INCISION AND DRAINAGE ABSCESS Right 10/10/2015   Procedure: Incision and drainage of complex axillary abscess Excisions of complex hidradenitis on right axilla                            3. Excisional debridement of skin subcutaneous tissue;  Surgeon: Jules Husbands, MD;  Location: [facility];  Service: General;  Laterality: Right;  . WISDOM TOOTH EXTRACTION  2016    Prior to Admission medications   Medication Sig Start Date End Date Taking? Authorizing Provider  acetaminophen (TYLENOL)  500 MG tablet Take 2 tablets (1,000 mg total) by mouth every 6 (six) hours as needed (for pain scale < 4). 08/04/18   Ward, Honor Loh, MD  cetirizine (ZYRTEC) 10 MG tablet Take 10 mg by mouth daily as needed.  04/07/18 04/07/19  [provider]  fluticasone (FLONASE) 50 MCG/ACT nasal spray Place 1 spray into both nostrils daily as needed.  03/25/18   [provider]  ibuprofen (ADVIL) 600 MG tablet Take 1 tablet (600 mg total) by mouth every 6 (six) hours. 08/04/18   Ward, Honor Loh, MD  Prenatal Vit-Fe Fumarate-FA (PRENATAL MULTIVITAMIN) TABS tablet Take 1 tablet by mouth daily at 12 noon.    [provider]  PROAIR HFA 108 [phone] Base) MCG/ACT inhaler Inhale 2 puffs into the lungs every 6 (six) hours as needed. 10/26/15   [provider]     Allergies Patient has no known allergies.  Family History  Problem Relation Age of Onset  . Throat cancer Maternal Uncle   . Heart disease Maternal Grandmother   . Hypertension Maternal Grandmother   . ADD / ADHD Father   . Hypertension Paternal Grandmother   . Diabetes Paternal Grandmother     Social History Social History   Tobacco Use  . Smoking status: Never Smoker  . Smokeless tobacco: Never Used  Substance Use Topics  . Alcohol use: Yes    Comment: occ  . Drug use: No    Review of Systems  Constitutional: No fever/chills Eyes: No change in vision ENT: No sore throat. Cardiovascular: Denies chest pain. Respiratory: Denies shortness of breath. Gastrointestinal: As above Genitourinary: As above Musculoskeletal: Negative for back pain. Skin: Negative for rash. Neurological: Negative for headaches    ____________________________________________   PHYSICAL EXAM:  VITAL SIGNS: ED Triage Vitals  Enc Vitals Group     BP 09/12/18 1157 122/63     Pulse Rate 09/12/18 1157 77     Resp 09/12/18 1157 16     Temp 09/12/18 1152 98.2 F (36.8 C)     Temp Source 09/12/18 1152 Oral     SpO2 09/12/18 1157  96 %     Weight 09/12/18 1152 68 kg (150 lb)     Height 09/12/18 1152 1.575 m (5\' 2" )     Head Circumference --      Peak Flow --      Pain Score 09/12/18 1151 7     Pain Loc --      Pain Edu? --      Excl. in GC? --     Constitutional: Alert and oriented.  Eyes: Conjunctivae are normal.  . Nose: No congestion/rhinnorhea. Mouth/Throat: Mucous membranes are moist.    Cardiovascular: Normal rate, regular rhythm.  Good peripheral circulation. Respiratory: Normal respiratory effort.  No retractions. Gastrointestinal: Soft and nontender. No distention.   Genitourinary: deferred Musculoskeletal:  Warm and well perfused Neurologic:  Normal speech and language. No gross focal neurologic deficits are appreciated.  Skin:  Skin is warm, dry and intact. No rash noted. Psychiatric: Mood and affect are normal. Speech and behavior are normal.  ____________________________________________   LABS (all labs ordered are listed, but only abnormal results are displayed)  Labs Reviewed  CBC WITH DIFFERENTIAL/PLATELET - Abnormal; Notable for the following components:      Result Value   RBC 5.35 (*)    MCV 72.5 (*)    MCH 22.4 (*)    All other components within normal limits  COMPREHENSIVE METABOLIC PANEL - Abnormal; Notable for the following components:   AST 14 (*)    All other components within normal limits  TYPE AND SCREEN   ____________________________________________  EKG  None ____________________________________________  RADIOLOGY  Ultrasound without thickening or gross abnormality of the endometrium ____________________________________________   PROCEDURES  Procedure(s) performed: No  Procedures   Critical Care performed: No ____________________________________________   INITIAL IMPRESSION / ASSESSMENT AND PLAN / ED COURSE  Pertinent labs & imaging results that were available during my care of the patient were reviewed by me and considered in my medical  decision making (see chart for details).  Patient presents with vaginal bleeding, over 1 month status post delivery, no fevers or chills a suggest retained products hemoglobin stable at 12.0.  Vital signs quite reassuring, afebrile unremarkable abdominal exam.  Will consider ultrasound ----------------------------------------- 2:12 PM on 09/12/2018 -----------------------------------------  Ultrasound unremarkable, hemoglobin stable, vitals normal.  Appropriate for discharge at this time with close follow-up with OB, patient has appointment tomorrow with her OB    ____________________________________________   FINAL CLINICAL IMPRESSION(S) / ED DIAGNOSES  Final diagnoses:  Abnormal vaginal bleeding        Note:  This document was prepared using Dragon voice recognition software and may include unintentional dictation errors.   Jene EveryKinner, Cardin Nitschke, MD 09/12/18 [phone]

## 2018-09-12 NOTE — ED Triage Notes (Signed)
Patient c/o heavy vaginal bleeding and pelvic pain since 09/08/18. Patient had vaginal delivery 08/03/18.

## 2018-09-13 LAB — TYPE AND SCREEN
ABO/RH(D): A NEG
Antibody Screen: POSITIVE
Unit division: 0
Unit division: 0

## 2018-09-13 LAB — BPAM RBC
Blood Product Expiration Date: 202009142359
Blood Product Expiration Date: 202009142359
Unit Type and Rh: 600
Unit Type and Rh: 600

## 2018-09-23 ENCOUNTER — Ambulatory Visit: Payer: Medicaid Other

## 2018-09-27 ENCOUNTER — Ambulatory Visit: Payer: Medicaid Other

## 2018-10-05 ENCOUNTER — Ambulatory Visit (LOCAL_COMMUNITY_HEALTH_CENTER): Payer: Medicaid Other | Admitting: Family Medicine

## 2018-10-05 ENCOUNTER — Other Ambulatory Visit: Payer: Self-pay

## 2018-10-05 ENCOUNTER — Encounter: Payer: Self-pay | Admitting: Family Medicine

## 2018-10-05 VITALS — BP 125/73 | Ht 62.0 in | Wt 144.8 lb

## 2018-10-05 DIAGNOSIS — Z30011 Encounter for initial prescription of contraceptive pills: Secondary | ICD-10-CM

## 2018-10-05 DIAGNOSIS — Z3009 Encounter for other general counseling and advice on contraception: Secondary | ICD-10-CM | POA: Diagnosis not present

## 2018-10-05 MED ORDER — NORGESTIMATE-ETH ESTRADIOL 0.25-35 MG-MCG PO TABS: 1.0000 | ORAL_TABLET | Freq: Every day | ORAL | 11 refills | Status: DC

## 2018-10-05 NOTE — Progress Notes (Signed)
   [place] problem visit  [place] [facility]  Subjective:  Avarie Tavano is a 18 y.o. being seen today for   Chief Complaint  Patient presents with  . Contraception    OC  . Exposure to STD    HPI  18 yo G1P1 here for OCPs and  STD screen.  States that she had her PP exam @ [facility] on 09/11/2018.  She was treated for chlamydia on 09/17/2018 and she states her partner was treated on 09/21/2018.  She denies unprotected sex since treatment.    Does the patient have a current or past history of drug use? No    Health Maintenance Due  Topic Date Due  . INFLUENZA VACCINE  08/28/2018    ROS  The following portions of the patient's history were reviewed and updated as appropriate: allergies, current medications, past family history, past medical history, past social history, past surgical history and problem list. Problem list updated.   See flowsheet for other program required questions.  Objective:   Vitals:   10/05/18 1008  BP: 125/73  Weight: 144 lb 12.8 oz (65.7 kg)  Height: 5\' 2"  (1.575 m)    Physical Exam not indicated    Assessment and Plan:  Merline Perkin is a 18 y.o. female presenting to the [facility] Department for a Women's Health problem visit  1. General counseling and advice on contraceptive management  2. OCP (oral contraceptive pills) initiation - norgestimate-ethinyl estradiol (ORTHO-CYCLEN) 0.25-35 MG-MCG tablet; Take 1 tablet by mouth daily.  Dispense: 1 Package; Refill: 11 Co. To use condoms for back-up x 1 wk and always for STD prevention.  3.  Co that its too soon to do the GC/Chlamydia test.  Client states that she will return in 2 wks for STD testing     No follow-ups on file.  Future Appointments  Date Time Provider [place]  10/18/2018 10:20 AM [facility] PROVIDER [facility] None    Hassell Done, FNP

## 2018-10-05 NOTE — Progress Notes (Signed)
Patient here for OC and STD check. States she gave birth to her son on 08/03/2018. Marland KitchenJenetta Downer, RN

## 2018-10-15 ENCOUNTER — Encounter: Payer: Self-pay | Admitting: Emergency Medicine

## 2018-10-15 ENCOUNTER — Emergency Department
Admission: EM | Admit: 2018-10-15 | Discharge: 2018-10-15 | Disposition: A | Discharge status: Home / Self Care | Payer: Medicaid Other | Attending: Emergency Medicine | Admitting: Emergency Medicine

## 2018-10-15 ENCOUNTER — Other Ambulatory Visit: Payer: Self-pay

## 2018-10-15 DIAGNOSIS — Y939 Activity, unspecified: Secondary | ICD-10-CM | POA: Insufficient documentation

## 2018-10-15 DIAGNOSIS — Y929 Unspecified place or not applicable: Secondary | ICD-10-CM | POA: Insufficient documentation

## 2018-10-15 DIAGNOSIS — S61306A Unspecified open wound of right little finger with damage to nail, initial encounter: Secondary | ICD-10-CM | POA: Insufficient documentation

## 2018-10-15 DIAGNOSIS — S6991XA Unspecified injury of right wrist, hand and finger(s), initial encounter: Secondary | ICD-10-CM

## 2018-10-15 DIAGNOSIS — X58XXXA Exposure to other specified factors, initial encounter: Secondary | ICD-10-CM | POA: Insufficient documentation

## 2018-10-15 DIAGNOSIS — Z79899 Other long term (current) drug therapy: Secondary | ICD-10-CM | POA: Diagnosis not present

## 2018-10-15 DIAGNOSIS — Y999 Unspecified external cause status: Secondary | ICD-10-CM | POA: Insufficient documentation

## 2018-10-15 DIAGNOSIS — J45909 Unspecified asthma, uncomplicated: Secondary | ICD-10-CM | POA: Insufficient documentation

## 2018-10-15 NOTE — ED Provider Notes (Signed)
[facility] Emergency Department Provider Note  ____________________________________________  Time seen: Approximately 6:50 PM  I have reviewed the triage vital signs and the nursing notes.   HISTORY  Chief Complaint Finger Injury    HPI Morgan Collier is a 18 y.o. female presents to the emergency department with a right fifth digit fingernail avulsion.  Patient reports that injury occurred several days ago with her fingernail was pulled away from the distal aspect of the nailbed.  Fingernail is still firmly situated at proximal nail bed.  Patient denies numbness or tingling of all 5 digits.  No redness of the skin surrounding the fingernail.        Past Medical History:  Diagnosis Date  . Asthma    Seasonal  . First trimester screening   . Gestational diabetes   . Hidradenitis suppurativa of right axilla   . Labor and delivery indication for care or intervention 08/02/2018  . Labor and delivery, indication for care 08/02/2018  . Normal labor and delivery 08/02/2018    There are no active problems to display for this patient.   Past Surgical History:  Procedure Laterality Date  . INCISION AND DRAINAGE ABSCESS Right 10/10/2015   Procedure: Incision and drainage of complex axillary abscess Excisions of complex hidradenitis on right axilla                            3. Excisional debridement of skin subcutaneous tissue;  Surgeon: Jules Husbands, MD;  Location: [facility];  Service: General;  Laterality: Right;  . WISDOM TOOTH EXTRACTION  2016    Prior to Admission medications   Medication Sig Start Date End Date Taking? Authorizing Provider  cetirizine (ZYRTEC) 10 MG tablet Take 10 mg by mouth daily as needed.  04/07/18 04/07/19  [provider]  fluticasone (FLONASE) 50 MCG/ACT nasal spray Place 1 spray into both nostrils daily as needed.  03/25/18   [provider]  norgestimate-ethinyl estradiol (ORTHO-CYCLEN) 0.25-35 MG-MCG  tablet Take 1 tablet by mouth daily. 10/05/18   Hassell Done, FNP  PROAIR HFA 108 [phone] Base) MCG/ACT inhaler Inhale 2 puffs into the lungs every 6 (six) hours as needed. 10/26/15   [provider]  sertraline (ZOLOFT) 100 MG tablet Take 100 mg by mouth daily.    [provider]  valACYclovir (VALTREX) 1000 MG tablet Take 1,000 mg by mouth 2 (two) times daily. PRN for onset symptoms    [provider]    Allergies Patient has no known allergies.  Family History  Problem Relation Age of Onset  . Throat cancer Maternal Uncle   . Heart disease Maternal Grandmother   . Hypertension Maternal Grandmother   . ADD / ADHD Father   . Hypertension Paternal Grandmother   . Diabetes Paternal Grandmother     Social History Social History   Tobacco Use  . Smoking status: Never Smoker  . Smokeless tobacco: Never Used  Substance Use Topics  . Alcohol use: Not Currently    Comment: occ  . Drug use: No     Review of Systems  Constitutional: No fever/chills Eyes: No visual changes. No discharge ENT: No upper respiratory complaints. Cardiovascular: no chest pain. Respiratory: no cough. No SOB. Gastrointestinal: No abdominal pain.  No nausea, no vomiting.  No diarrhea.  No constipation. Musculoskeletal: Negative for musculoskeletal pain. Skin: Patient has fingernail injury. Neurological: Negative for headaches, focal weakness or numbness.  ____________________________________________  PHYSICAL EXAM:  VITAL SIGNS: ED Triage Vitals  Enc Vitals Group     BP 10/15/18 1721 129/72     Pulse Rate 10/15/18 1721 80     Resp 10/15/18 1721 18     Temp 10/15/18 1721 99.2 F (37.3 C)     Temp Source 10/15/18 1721 Oral     SpO2 10/15/18 1721 100 %     Weight 10/15/18 1722 148 lb (67.1 kg)     Height 10/15/18 1722 5\' 2"  (1.575 m)     Head Circumference --      Peak Flow --      Pain Score 10/15/18 1722 0     Pain Loc --      Pain Edu? --      Excl. in [place]? --       Constitutional: Alert and oriented. Well appearing and in no acute distress. Eyes: Conjunctivae are normal. PERRL. EOMI. Head: Atraumatic. Cardiovascular: Normal rate, regular rhythm. Normal S1 and S2.  Good peripheral circulation. Respiratory: Normal respiratory effort without tachypnea or retractions. Lungs CTAB. Good air entry to the bases with no decreased or absent breath sounds. Musculoskeletal: Full range of motion to all extremities. No gross deformities appreciated. Neurologic:  Normal speech and language. No gross focal neurologic deficits are appreciated.  Skin: Patient's right fifth fingernail has been displaced distally but it is still attached proximally. Psychiatric: Mood and affect are normal. Speech and behavior are normal. Patient exhibits appropriate insight and judgement.   ____________________________________________   LABS (all labs ordered are listed, but only abnormal results are displayed)  Labs Reviewed - No data to display ____________________________________________  EKG   ____________________________________________  RADIOLOGY   No results found.  ____________________________________________    PROCEDURES  Procedure(s) performed:    Procedures    Medications - No data to display   ____________________________________________   INITIAL IMPRESSION / ASSESSMENT AND PLAN / ED COURSE  Pertinent labs & imaging results that were available during my care of the patient were reviewed by me and considered in my medical decision making (see chart for details).  Review of the [place] CSRS was performed in accordance of the [facility] prior to dispensing any controlled drugs.           Assessment and plan Nail avulsion 18 year old female presents to the emergency department with a right fifth digit nail avulsion.  Explained to patient that proximal aspect of fingernail would need to stay in place in order to stimulate new fingernail growth.  A  dressing was applied in the emergency department and patient education regarding basic wound care was given. All patient questions were answered.      ____________________________________________  FINAL CLINICAL IMPRESSION(S) / ED DIAGNOSES  Final diagnoses:  Injury to fingernail of right hand, initial encounter      NEW MEDICATIONS STARTED DURING THIS VISIT:  ED Discharge Orders    None          This chart was dictated using voice recognition software/Dragon. Despite best efforts to proofread, errors can occur which can change the meaning. Any change was purely unintentional.    Orvil FeilWoods, Gazella Anglin M, PA-C 10/15/18 1854    Chesley NoonJessup, Charles, MD 10/16/18 [phone]

## 2018-10-15 NOTE — ED Notes (Signed)
See triage note  States she noticed that her 5 th finger nail is partially removed  Having increased pain

## 2018-10-15 NOTE — ED Triage Notes (Signed)
Pt arrived via EMS to ER due to finger injury. Pt states getting her nails done on 9/12 and that her right pinky nail has completely detached. Pt states wanting to have nail removed completely but that it is still attached at the root.

## 2018-10-18 ENCOUNTER — Encounter: Payer: Self-pay | Admitting: Physician Assistant

## 2018-10-18 ENCOUNTER — Ambulatory Visit: Payer: Medicaid Other | Admitting: Physician Assistant

## 2018-10-18 ENCOUNTER — Other Ambulatory Visit: Payer: Self-pay

## 2018-10-18 DIAGNOSIS — Z113 Encounter for screening for infections with a predominantly sexual mode of transmission: Secondary | ICD-10-CM | POA: Diagnosis not present

## 2018-10-18 NOTE — Progress Notes (Signed)
S:  Patient into clinic requesting TOC for Chlamydia.  States that she was treated about one month ago and that her partner also completed treatment.  Declines other screening and blood work today.   O:  WDWN female in NAD, A&O x 3. GC/Chlamydia test pending. A/P:  1.  Patient into clinic for [facility] and declines other testing today. 2.  Counseled patient how to self-collect and she opts to do this.  3.  Counseled patient that we will contact her if she needs to RTC for any treatment. 4.  Rec condoms with all sex. 5.  RTC prn.  6.  See flowsheet for added information.

## 2018-11-11 ENCOUNTER — Other Ambulatory Visit: Payer: Self-pay

## 2018-11-11 ENCOUNTER — Encounter: Payer: Self-pay | Admitting: Emergency Medicine

## 2018-11-11 ENCOUNTER — Emergency Department
Admission: EM | Admit: 2018-11-11 | Discharge: 2018-11-11 | Disposition: A | Discharge status: Home / Self Care | Payer: Medicaid Other | Attending: Emergency Medicine | Admitting: Emergency Medicine

## 2018-11-11 ENCOUNTER — Emergency Department: Payer: Medicaid Other

## 2018-11-11 DIAGNOSIS — R102 Pelvic and perineal pain: Secondary | ICD-10-CM

## 2018-11-11 DIAGNOSIS — O99891 Other specified diseases and conditions complicating pregnancy: Secondary | ICD-10-CM | POA: Diagnosis present

## 2018-11-11 DIAGNOSIS — Z79899 Other long term (current) drug therapy: Secondary | ICD-10-CM | POA: Diagnosis not present

## 2018-11-11 DIAGNOSIS — O99511 Diseases of the respiratory system complicating pregnancy, first trimester: Secondary | ICD-10-CM | POA: Diagnosis not present

## 2018-11-11 DIAGNOSIS — Z3A01 Less than 8 weeks gestation of pregnancy: Secondary | ICD-10-CM | POA: Diagnosis not present

## 2018-11-11 DIAGNOSIS — J45909 Unspecified asthma, uncomplicated: Secondary | ICD-10-CM | POA: Diagnosis not present

## 2018-11-11 DIAGNOSIS — R1030 Lower abdominal pain, unspecified: Secondary | ICD-10-CM | POA: Insufficient documentation

## 2018-11-11 LAB — COMPREHENSIVE METABOLIC PANEL
ALT: 14 U/L (ref 0–44)
AST: 14 U/L — ABNORMAL LOW (ref 15–41)
Albumin: 3.8 g/dL (ref 3.5–5.0)
Alkaline Phosphatase: 75 U/L (ref 38–126)
Anion gap: 4 — ABNORMAL LOW (ref 5–15)
BUN: 14 mg/dL (ref 6–20)
CO2: 24 mmol/L (ref 22–32)
Calcium: 9.2 mg/dL (ref 8.9–10.3)
Chloride: 107 mmol/L (ref 98–111)
Creatinine, Ser: 0.68 mg/dL (ref 0.44–1.00)
GFR calc Af Amer: >60 mL/min (ref >60)
GFR calc non Af Amer: >60 mL/min (ref >60)
Glucose, Bld: 102 mg/dL — ABNORMAL HIGH (ref 70–99)
Potassium: 3.4 mmol/L — ABNORMAL LOW (ref 3.5–5.1)
Sodium: 135 mmol/L (ref 135–145)
Total Bilirubin: 0.4 mg/dL (ref 0.3–1.2)
Total Protein: 7.6 g/dL (ref 6.5–8.1)

## 2018-11-11 LAB — LIPASE, BLOOD: Lipase: 21 U/L (ref 11–51)

## 2018-11-11 LAB — POCT PREGNANCY, URINE: Preg Test, Ur: POSITIVE — AB

## 2018-11-11 LAB — CBC
HCT: 34.6 % — ABNORMAL LOW (ref 36.0–46.0)
Hemoglobin: 11 g/dL — ABNORMAL LOW (ref 12.0–15.0)
MCH: 22.4 pg — ABNORMAL LOW (ref 26.0–34.0)
MCHC: 31.8 g/dL (ref 30.0–36.0)
MCV: 70.6 fL — ABNORMAL LOW (ref 80.0–100.0)
Platelets: 328 10*3/uL (ref 150–400)
RBC: 4.9 MIL/uL (ref 3.87–5.11)
RDW: 14.1 % (ref 11.5–15.5)
WBC: 10.2 10*3/uL (ref 4.0–10.5)
nRBC: 0 % (ref 0.0–0.2)

## 2018-11-11 LAB — URINALYSIS, COMPLETE (UACMP) WITH MICROSCOPIC
Bacteria, UA: NONE SEEN
Bilirubin Urine: NEGATIVE
Glucose, UA: NEGATIVE mg/dL
Hgb urine dipstick: NEGATIVE
Ketones, ur: NEGATIVE mg/dL
Leukocytes,Ua: NEGATIVE
Nitrite: NEGATIVE
Protein, ur: NEGATIVE mg/dL
Specific Gravity, Urine: 1.015 (ref 1.005–1.030)
pH: 5 (ref 5.0–8.0)

## 2018-11-11 LAB — HCG, QUANTITATIVE, PREGNANCY: hCG, Beta Chain, Quant, S: 1288 m[IU]/mL — ABNORMAL HIGH (ref <5)

## 2018-11-11 MED ORDER — ACETAMINOPHEN 500 MG PO TABS
1000.0000 mg | ORAL_TABLET | Freq: Once | ORAL | Status: AC
  Administered 2018-11-11: 19:00:00 1000 mg via ORAL
  Filled 2018-11-11: qty 2

## 2018-11-11 NOTE — ED Triage Notes (Signed)
PT arrives with complaints of lower abdominal cramping that started "a day or two ago." PT reports clear vaginal discharge with mild "spotting." Pt denies n/v/d.

## 2018-11-11 NOTE — ED Notes (Signed)
Pt gathered all personal belongings from room and removed them prior to ED departure  

## 2018-11-11 NOTE — ED Provider Notes (Signed)
[facility] Emergency Department Provider Note  ____________________________________________  Time seen: Approximately 6:23 PM  I have reviewed the triage vital signs and the nursing notes.   HISTORY  Chief Complaint Abdominal Pain   HPI Morgan Collier is a 18 y.o. female who presents for evaluation of abdominal cramping and missed menstrual period.  Patient is 3 months postpartum.  Last menstrual period was 6 weeks ago.  Over the last few days has been having intermittent lower abdominal cramping.  Took a home pregnancy test which was positive.  No nausea or vomiting, no vaginal discharge or vaginal bleeding, no fever or chills, no dysuria or hematuria.  At this time her pain is mild.   Past Medical History:  Diagnosis Date  . Asthma    Seasonal  . First trimester screening   . Gestational diabetes   . Hidradenitis suppurativa of right axilla   . Labor and delivery indication for care or intervention 08/02/2018  . Labor and delivery, indication for care 08/02/2018  . Normal labor and delivery 08/02/2018    There are no active problems to display for this patient.   Past Surgical History:  Procedure Laterality Date  . INCISION AND DRAINAGE ABSCESS Right 10/10/2015   Procedure: Incision and drainage of complex axillary abscess Excisions of complex hidradenitis on right axilla                            3. Excisional debridement of skin subcutaneous tissue;  Surgeon: Leafy Roiego F Pabon, MD;  [facility];  Service: General;  Laterality: Right;  . WISDOM TOOTH EXTRACTION  2016    Prior to Admission medications   Medication Sig Start Date End Date Taking? Authorizing Provider  cetirizine (ZYRTEC) 10 MG tablet Take 10 mg by mouth daily as needed.  04/07/18 04/07/19  [provider]  fluticasone (FLONASE) 50 MCG/ACT nasal spray Place 1 spray into both nostrils daily as needed.  03/25/18   [provider]  norgestimate-ethinyl  estradiol (ORTHO-CYCLEN) 0.25-35 MG-MCG tablet Take 1 tablet by mouth daily. 10/05/18   Larene PickettLatta, Cynthia, FNP  PROAIR HFA 108 [phone](90 Base) MCG/ACT inhaler Inhale 2 puffs into the lungs every 6 (six) hours as needed. 10/26/15   [provider]  sertraline (ZOLOFT) 100 MG tablet Take 100 mg by mouth daily.    [provider]  valACYclovir (VALTREX) 1000 MG tablet Take 1,000 mg by mouth 2 (two) times daily. PRN for onset symptoms    [provider]    Allergies Patient has no known allergies.  Family History  Problem Relation Age of Onset  . Throat cancer Maternal Uncle   . Heart disease Maternal Grandmother   . Hypertension Maternal Grandmother   . ADD / ADHD Father   . Hypertension Paternal Grandmother   . Diabetes Paternal Grandmother     Social History Social History   Tobacco Use  . Smoking status: Never Smoker  . Smokeless tobacco: Never Used  Substance Use Topics  . Alcohol use: Not Currently    Comment: occ  . Drug use: No    Review of Systems  Constitutional: Negative for fever. Eyes: Negative for visual changes. ENT: Negative for sore throat. Neck: No neck pain  Cardiovascular: Negative for chest pain. Respiratory: Negative for shortness of breath. Gastrointestinal: + lower abdominal pain. No vomiting or diarrhea. Genitourinary: Negative for dysuria. Musculoskeletal: Negative for back pain. Skin: Negative for rash. Neurological: Negative for  headaches, weakness or numbness. Psych: No SI or HI  ____________________________________________   PHYSICAL EXAM:  VITAL SIGNS: ED Triage Vitals  Enc Vitals Group     BP 11/11/18 1744 (!) 142/87     Pulse Rate 11/11/18 1744 (!) 101     Resp 11/11/18 1744 17     Temp 11/11/18 1744 98.2 F (36.8 C)     Temp Source 11/11/18 1744 Oral     SpO2 11/11/18 1744 100 %     Weight 11/11/18 1745 148 lb (67.1 kg)     Height 11/11/18 1745 5\' 2"  (1.575 m)     Head Circumference --      Peak Flow --       Pain Score 11/11/18 1744 9     Pain Loc --      Pain Edu? --      Excl. in [place]? --     Constitutional: Alert and oriented. Well appearing and in no apparent distress. HEENT:      Head: Normocephalic and atraumatic.         Eyes: Conjunctivae are normal. Sclera is non-icteric.       Mouth/Throat: Mucous membranes are moist.       Neck: Supple with no signs of meningismus. Cardiovascular: Regular rate and rhythm. No murmurs, gallops, or rubs. 2+ symmetrical distal pulses are present in all extremities. No JVD. Respiratory: Normal respiratory effort. Lungs are clear to auscultation bilaterally. No wheezes, crackles, or rhonchi.  Gastrointestinal: Soft, non tender, and non distended with positive bowel sounds. No rebound or guarding. Genitourinary: No CVA tenderness. Musculoskeletal: Nontender with normal range of motion in all extremities. No edema, cyanosis, or erythema of extremities. Neurologic: Normal speech and language. Face is symmetric. Moving all extremities. No gross focal neurologic deficits are appreciated. Skin: Skin is warm, dry and intact. No rash noted. Psychiatric: Mood and affect are normal. Speech and behavior are normal.  ____________________________________________   LABS (all labs ordered are listed, but only abnormal results are displayed)  Labs Reviewed  COMPREHENSIVE METABOLIC PANEL - Abnormal; Notable for the following components:      Result Value   Potassium 3.4 (*)    Glucose, Bld 102 (*)    AST 14 (*)    Anion gap 4 (*)    All other components within normal limits  CBC - Abnormal; Notable for the following components:   Hemoglobin 11.0 (*)    HCT 34.6 (*)    MCV 70.6 (*)    MCH 22.4 (*)    All other components within normal limits  URINALYSIS, COMPLETE (UACMP) WITH MICROSCOPIC - Abnormal; Notable for the following components:   Color, Urine STRAW (*)    APPearance CLEAR (*)    All other components within normal limits  HCG, QUANTITATIVE, PREGNANCY  - Abnormal; Notable for the following components:   hCG, Beta Chain, Quant, S 1,288 (*)    All other components within normal limits  POCT PREGNANCY, URINE - Abnormal; Notable for the following components:   Preg Test, Ur POSITIVE (*)    All other components within normal limits  LIPASE, BLOOD  POC URINE PREG, ED   ____________________________________________  EKG  none  ____________________________________________  RADIOLOGY  I have personally reviewed the images performed during this visit and I agree with the Radiologist's read.   Interpretation by Radiologist:  US Ob Less Than 14 Weeks With Ob Transvaginal  Result Date: 11/11/2018 CLINICAL DATA:  Pelvic pain and cramping for 2 days. EXAM: OBSTETRIC <  14 WK [place] AND TRANSVAGINAL OB US TECHNIQUE: Both transabdominal and transvaginal ultrasound examinations were performed for complete evaluation of the gestation as well as the maternal uterus, adnexal regions, and pelvic cul-de-sac. Transvaginal technique was performed to assess early pregnancy. COMPARISON:  None. FINDINGS: Intrauterine gestational sac: Single Yolk sac:  Not Visualized. Embryo:  Not Visualized. Cardiac Activity: Not Visualized. MSD: 3 mm   5 w   0 d Subchorionic hemorrhage:  None visualized. Maternal uterus/adnexae: No fibroids identified. Both ovaries are normal in appearance. No mass or abnormal free fluid identified. IMPRESSION: Single early intrauterine gestational sac measuring 5 weeks 0 days by mean sac diameter. Suggest correlation with serial b-hCG levels, and consider followup ultrasound to assess viability in 14 days. Electronically Signed   By: Danae Orleans M.D.   On: 11/11/2018 19:08      ____________________________________________   PROCEDURES  Procedure(s) performed: None Procedures Critical Care performed:  None ____________________________________________   INITIAL IMPRESSION / ASSESSMENT AND PLAN / ED COURSE  18 y.o. female who presents for  evaluation of abdominal cramping and missed menstrual period.  Abdomen is soft with no tenderness throughout.  Pregnancy test positive.  Recommended a pelvic exam with STD screening which patient has declined.  We will send her for transvaginal ultrasound to rule out ectopic pregnancy. ddx pregnancy, ectopic, STD.   _________________________ 7:14 PM on 11/11/2018 -----------------------------------------  Ultrasound showing a intrauterine 5-week gestational sac.  Discussed the finding with patient.  Recommended close follow-up with OB/GYN for viability assessment.  Discussed return precautions for new or worsening abdominal pain or vaginal bleeding.  Discussed prenatal care.  Patient stable for discharge.  Serial abdominal exams with no tenderness.       As part of my medical decision making, I reviewed the following data within the electronic MEDICAL RECORD NUMBER Nursing notes reviewed and incorporated, Labs reviewed , Old chart reviewed, Radiograph reviewed , Notes from prior ED visits and [place] Controlled Substance Database   Patient was evaluated in Emergency Department today for the symptoms described in the history of present illness. Patient was evaluated in the context of the global COVID-19 pandemic, which necessitated consideration that the patient might be at risk for infection with the SARS-CoV-2 virus that causes COVID-19. Institutional protocols and algorithms that pertain to the evaluation of patients at risk for COVID-19 are in a state of rapid change based on information released by regulatory bodies including the CDC and federal and state organizations. These policies and algorithms were followed during the patient's care in the ED.   ____________________________________________   FINAL CLINICAL IMPRESSION(S) / ED DIAGNOSES   Final diagnoses:  Less than [redacted] weeks gestation of pregnancy      NEW MEDICATIONS STARTED DURING THIS VISIT:  ED Discharge Orders    None        Note:  This document was prepared using Dragon voice recognition software and may include unintentional dictation errors.    Nita Sickle, MD 11/11/18 [phone]

## 2018-11-26 DIAGNOSIS — Z3490 Encounter for supervision of normal pregnancy, unspecified, unspecified trimester: Secondary | ICD-10-CM

## 2018-12-28 LAB — OB RESULTS CONSOLE RPR: RPR: NONREACTIVE

## 2018-12-28 LAB — OB RESULTS CONSOLE VARICELLA ZOSTER ANTIBODY, IGG: Varicella: NON-IMMUNE/NOT IMMUNE

## 2018-12-28 LAB — OB RESULTS CONSOLE RUBELLA ANTIBODY, IGM: Rubella: IMMUNE

## 2018-12-28 LAB — OB RESULTS CONSOLE GC/CHLAMYDIA
Chlamydia: NEGATIVE
Gonorrhea: NEGATIVE

## 2018-12-28 LAB — OB RESULTS CONSOLE HIV ANTIBODY (ROUTINE TESTING): HIV: NONREACTIVE

## 2018-12-28 LAB — OB RESULTS CONSOLE HEPATITIS B SURFACE ANTIGEN: Hepatitis B Surface Ag: NEGATIVE

## 2019-01-03 ENCOUNTER — Emergency Department
Admission: EM | Admit: 2019-01-03 | Discharge: 2019-01-04 | Disposition: A | Discharge status: Home / Self Care | Payer: Medicaid Other | Attending: Emergency Medicine | Admitting: Emergency Medicine

## 2019-01-03 ENCOUNTER — Encounter: Payer: Self-pay | Admitting: Emergency Medicine

## 2019-01-03 ENCOUNTER — Other Ambulatory Visit: Payer: Self-pay

## 2019-01-03 ENCOUNTER — Emergency Department: Payer: Medicaid Other

## 2019-01-03 DIAGNOSIS — Z5321 Procedure and treatment not carried out due to patient leaving prior to being seen by health care provider: Secondary | ICD-10-CM | POA: Diagnosis not present

## 2019-01-03 DIAGNOSIS — N939 Abnormal uterine and vaginal bleeding, unspecified: Secondary | ICD-10-CM | POA: Diagnosis present

## 2019-01-03 LAB — CBC WITH DIFFERENTIAL/PLATELET
Abs Immature Granulocytes: 0.05 10*3/uL (ref 0.00–0.07)
Basophils Absolute: 0 10*3/uL (ref 0.0–0.1)
Basophils Relative: 0 %
Eosinophils Absolute: 0.2 10*3/uL (ref 0.0–0.5)
Eosinophils Relative: 2 %
HCT: 38.7 % (ref 36.0–46.0)
Hemoglobin: 12.5 g/dL (ref 12.0–15.0)
Immature Granulocytes: 0 %
Lymphocytes Relative: 27 %
Lymphs Abs: 3.5 10*3/uL (ref 0.7–4.0)
MCH: 22.8 pg — ABNORMAL LOW (ref 26.0–34.0)
MCHC: 32.3 g/dL (ref 30.0–36.0)
MCV: 70.6 fL — ABNORMAL LOW (ref 80.0–100.0)
Monocytes Absolute: 0.7 10*3/uL (ref 0.1–1.0)
Monocytes Relative: 6 %
Neutro Abs: 8.5 10*3/uL — ABNORMAL HIGH (ref 1.7–7.7)
Neutrophils Relative %: 65 %
Platelets: 340 10*3/uL (ref 150–400)
RBC: 5.48 MIL/uL — ABNORMAL HIGH (ref 3.87–5.11)
RDW: 14.6 % (ref 11.5–15.5)
WBC: 13.1 10*3/uL — ABNORMAL HIGH (ref 4.0–10.5)
nRBC: 0 % (ref 0.0–0.2)

## 2019-01-03 LAB — COMPREHENSIVE METABOLIC PANEL
ALT: 10 U/L (ref 0–44)
AST: 14 U/L — ABNORMAL LOW (ref 15–41)
Albumin: 3.9 g/dL (ref 3.5–5.0)
Alkaline Phosphatase: 74 U/L (ref 38–126)
Anion gap: 7 (ref 5–15)
BUN: 10 mg/dL (ref 6–20)
CO2: 22 mmol/L (ref 22–32)
Calcium: 9.3 mg/dL (ref 8.9–10.3)
Chloride: 104 mmol/L (ref 98–111)
Creatinine, Ser: 0.48 mg/dL (ref 0.44–1.00)
GFR calc Af Amer: >60 mL/min (ref >60)
GFR calc non Af Amer: >60 mL/min (ref >60)
Glucose, Bld: 107 mg/dL — ABNORMAL HIGH (ref 70–99)
Potassium: 3.6 mmol/L (ref 3.5–5.1)
Sodium: 133 mmol/L — ABNORMAL LOW (ref 135–145)
Total Bilirubin: 0.2 mg/dL — ABNORMAL LOW (ref 0.3–1.2)
Total Protein: 7.7 g/dL (ref 6.5–8.1)

## 2019-01-03 LAB — URINALYSIS, COMPLETE (UACMP) WITH MICROSCOPIC
Bacteria, UA: NONE SEEN
Bilirubin Urine: NEGATIVE
Glucose, UA: NEGATIVE mg/dL
Hgb urine dipstick: NEGATIVE
Ketones, ur: 5 mg/dL — AB
Leukocytes,Ua: NEGATIVE
Nitrite: NEGATIVE
Protein, ur: NEGATIVE mg/dL
Specific Gravity, Urine: 1.029 (ref 1.005–1.030)
pH: 5 (ref 5.0–8.0)

## 2019-01-03 LAB — ABO/RH: ABO/RH(D): A NEG

## 2019-01-03 LAB — HCG, QUANTITATIVE, PREGNANCY: hCG, Beta Chain, Quant, S: 138330 m[IU]/mL — ABNORMAL HIGH (ref <5)

## 2019-01-03 NOTE — ED Notes (Addendum)
Called pt several times from lobby with no answer; attempted to call pt's cell phone with no answer

## 2019-01-03 NOTE — ED Triage Notes (Signed)
Patient ambulatory to triage with steady gait, without difficulty or distress noted, mask in place; pt reports with [redacted]wks pregnant with vag bleeding and cramping that begun today; G2P1, pt at [facility]

## 2019-01-04 NOTE — ED Notes (Signed)
Called pt several times from lobby with no answer; attempted to call pt's cell phone with no answer 

## 2019-03-25 ENCOUNTER — Emergency Department
Admission: EM | Admit: 2019-03-25 | Discharge: 2019-03-26 | Disposition: A | Discharge status: Home / Self Care | Payer: Medicaid Other | Source: Home / Self Care | Attending: Student | Admitting: Student

## 2019-03-25 ENCOUNTER — Encounter: Payer: Self-pay | Admitting: Emergency Medicine

## 2019-03-25 ENCOUNTER — Other Ambulatory Visit: Payer: Self-pay

## 2019-03-25 DIAGNOSIS — R591 Generalized enlarged lymph nodes: Secondary | ICD-10-CM | POA: Insufficient documentation

## 2019-03-25 DIAGNOSIS — Z79899 Other long term (current) drug therapy: Secondary | ICD-10-CM | POA: Insufficient documentation

## 2019-03-25 DIAGNOSIS — R509 Fever, unspecified: Secondary | ICD-10-CM

## 2019-03-25 DIAGNOSIS — Z20822 Contact with and (suspected) exposure to covid-19: Secondary | ICD-10-CM | POA: Insufficient documentation

## 2019-03-25 DIAGNOSIS — J45909 Unspecified asthma, uncomplicated: Secondary | ICD-10-CM | POA: Insufficient documentation

## 2019-03-25 DIAGNOSIS — L732 Hidradenitis suppurativa: Secondary | ICD-10-CM | POA: Insufficient documentation

## 2019-03-25 LAB — URINALYSIS, COMPLETE (UACMP) WITH MICROSCOPIC
Bacteria, UA: NONE SEEN
Bilirubin Urine: NEGATIVE
Glucose, UA: NEGATIVE mg/dL
Hgb urine dipstick: NEGATIVE
Ketones, ur: 20 mg/dL — AB
Leukocytes,Ua: NEGATIVE
Nitrite: NEGATIVE
Protein, ur: NEGATIVE mg/dL
Specific Gravity, Urine: 1.018 (ref 1.005–1.030)
pH: 6 (ref 5.0–8.0)

## 2019-03-25 LAB — CBC WITH DIFFERENTIAL/PLATELET
Abs Immature Granulocytes: 0.09 10*3/uL — ABNORMAL HIGH (ref 0.00–0.07)
Basophils Absolute: 0 10*3/uL (ref 0.0–0.1)
Basophils Relative: 0 %
Eosinophils Absolute: 0 10*3/uL (ref 0.0–0.5)
Eosinophils Relative: 0 %
HCT: 36 % (ref 36.0–46.0)
Hemoglobin: 11.5 g/dL — ABNORMAL LOW (ref 12.0–15.0)
Immature Granulocytes: 1 %
Lymphocytes Relative: 10 %
Lymphs Abs: 1.6 10*3/uL (ref 0.7–4.0)
MCH: 22.8 pg — ABNORMAL LOW (ref 26.0–34.0)
MCHC: 31.9 g/dL (ref 30.0–36.0)
MCV: 71.4 fL — ABNORMAL LOW (ref 80.0–100.0)
Monocytes Absolute: 1.3 10*3/uL — ABNORMAL HIGH (ref 0.1–1.0)
Monocytes Relative: 8 %
Neutro Abs: 13.7 10*3/uL — ABNORMAL HIGH (ref 1.7–7.7)
Neutrophils Relative %: 81 %
Platelets: 265 10*3/uL (ref 150–400)
RBC: 5.04 MIL/uL (ref 3.87–5.11)
RDW: 15.7 % — ABNORMAL HIGH (ref 11.5–15.5)
WBC: 16.7 10*3/uL — ABNORMAL HIGH (ref 4.0–10.5)
nRBC: 0 % (ref 0.0–0.2)

## 2019-03-25 LAB — COMPREHENSIVE METABOLIC PANEL
ALT: 13 U/L (ref 0–44)
AST: 14 U/L — ABNORMAL LOW (ref 15–41)
Albumin: 3.3 g/dL — ABNORMAL LOW (ref 3.5–5.0)
Alkaline Phosphatase: 73 U/L (ref 38–126)
Anion gap: 7 (ref 5–15)
BUN: <5 mg/dL — ABNORMAL LOW (ref 6–20)
CO2: 23 mmol/L (ref 22–32)
Calcium: 8.6 mg/dL — ABNORMAL LOW (ref 8.9–10.3)
Chloride: 102 mmol/L (ref 98–111)
Creatinine, Ser: 0.55 mg/dL (ref 0.44–1.00)
GFR calc Af Amer: >60 mL/min (ref >60)
GFR calc non Af Amer: >60 mL/min (ref >60)
Glucose, Bld: 78 mg/dL (ref 70–99)
Potassium: 3.6 mmol/L (ref 3.5–5.1)
Sodium: 132 mmol/L — ABNORMAL LOW (ref 135–145)
Total Bilirubin: 0.6 mg/dL (ref 0.3–1.2)
Total Protein: 7.7 g/dL (ref 6.5–8.1)

## 2019-03-25 LAB — POC SARS CORONAVIRUS 2 AG: SARS Coronavirus 2 Ag: NEGATIVE

## 2019-03-25 LAB — OB RESULTS CONSOLE GC/CHLAMYDIA
Chlamydia: NEGATIVE
Gonorrhea: NEGATIVE

## 2019-03-25 MED ORDER — CEPHALEXIN 500 MG PO CAPS: 500.0000 mg | ORAL_CAPSULE | Freq: Four times a day (QID) | ORAL | 0 refills | Status: AC

## 2019-03-25 MED ORDER — SODIUM CHLORIDE 0.9 % IV BOLUS
1000.0000 mL | Freq: Once | INTRAVENOUS | Status: AC
  Administered 2019-03-25: 1000 mL via INTRAVENOUS

## 2019-03-25 MED ORDER — ACETAMINOPHEN 500 MG PO TABS
1000.0000 mg | ORAL_TABLET | Freq: Once | ORAL | Status: AC
  Administered 2019-03-25: 22:00:00 1000 mg via ORAL
  Filled 2019-03-25: qty 2

## 2019-03-25 MED ORDER — CLINDAMYCIN PHOSPHATE 1 % EX GEL: CUTANEOUS | 0 refills | Status: DC

## 2019-03-25 NOTE — ED Provider Notes (Signed)
[facility] Emergency Department Provider Note  ____________________________________________   First MD Initiated Contact with Patient 03/25/19 1903     (approximate)  I have reviewed the triage vital signs and the nursing notes.  History  Chief Complaint Fever and Lymphadenopathy    HPI Morgan Collier is a 19 y.o. female at 24 weeks, history of hidradenitis, who presents to the emergency department for fever and pain in the bilateral axilla and groin area.  Patient reports developing local areas of swelling/nodules in the bilateral axilla and groin yesterday.  Progressively worsening since onset.  Describes it as a tenderness, 10/10 in severity.  No radiation.  Worsened with palpation.  No alleviating components.  Noted to have a fever at OB/GYN clinic visit earlier today.  Denies any vomiting or diarrhea.  No dysuria or vaginal discharge.  Reports positive fetal movement.  No cough or difficulty breathing.  No sore throat.  Had negative HIV testing in December.   Past Medical Hx Past Medical History:  Diagnosis Date  . Asthma    Seasonal  . First trimester screening   . Gestational diabetes   . Hidradenitis suppurativa of right axilla   . Labor and delivery indication for care or intervention 08/02/2018  . Labor and delivery, indication for care 08/02/2018  . Normal labor and delivery 08/02/2018    Problem List There are no problems to display for this patient.   Past Surgical Hx Past Surgical History:  Procedure Laterality Date  . INCISION AND DRAINAGE ABSCESS Right 10/10/2015   Procedure: Incision and drainage of complex axillary abscess Excisions of complex hidradenitis on right axilla                            3. Excisional debridement of skin subcutaneous tissue;  Surgeon: Leafy Ro, MD;  Location: [facility];  Service: General;  Laterality: Right;  . WISDOM TOOTH EXTRACTION  2016    Medications Prior to Admission medications     Medication Sig Start Date End Date Taking? Authorizing Provider  cetirizine (ZYRTEC) 10 MG tablet Take 10 mg by mouth daily as needed.  04/07/18 04/07/19  [provider]  fluticasone (FLONASE) 50 MCG/ACT nasal spray Place 1 spray into both nostrils daily as needed.  03/25/18   [provider]  norgestimate-ethinyl estradiol (ORTHO-CYCLEN) 0.25-35 MG-MCG tablet Take 1 tablet by mouth daily. 10/05/18   Larene Pickett, FNP  PROAIR HFA [phone] Base) MCG/ACT inhaler Inhale 2 puffs into the lungs every 6 (six) hours as needed. 10/26/15   [provider]  sertraline (ZOLOFT) 100 MG tablet Take 100 mg by mouth daily.    [provider]  valACYclovir (VALTREX) 1000 MG tablet Take 1,000 mg by mouth 2 (two) times daily. PRN for onset symptoms    [provider]    Allergies Patient has no known allergies.  Family Hx Family History  Problem Relation Age of Onset  . Throat cancer Maternal Uncle   . Heart disease Maternal Grandmother   . Hypertension Maternal Grandmother   . ADD / ADHD Father   . Hypertension Paternal Grandmother   . Diabetes Paternal Grandmother     Social Hx Social History   Tobacco Use  . Smoking status: Never Smoker  . Smokeless tobacco: Never Used  Substance Use Topics  . Alcohol use: Not Currently    Comment: occ  . Drug use: No     Review of  Systems  Constitutional: Positive for fever. Eyes: Negative for visual changes. ENT: Negative for sore throat. Cardiovascular: Negative for chest pain. Respiratory: Negative for shortness of breath. Gastrointestinal: Negative for nausea, vomiting.  Genitourinary: Negative for dysuria. Musculoskeletal: Negative for leg swelling. Skin: Positive for hidradenitis versus lymphadenitis Neurological: Negative for headaches.   Physical Exam  Vital Signs: ED Triage Vitals  Enc Vitals Group     BP 03/25/19 1559 126/74     Pulse Rate 03/25/19 1559 (!) 130     Resp 03/25/19 1559 18      Temp 03/25/19 1559 99.8 F (37.7 C)     Temp Source 03/25/19 1559 Oral     SpO2 03/25/19 1559 99 %     Weight 03/25/19 1600 161 lb (73 kg)     Height 03/25/19 1600 5\' 2"  (1.575 m)     Head Circumference --      Peak Flow --      Pain Score 03/25/19 1600 10     Pain Loc --      Pain Edu? --      Excl. in [place]? --     Constitutional: Alert and oriented.  Head: Normocephalic. Atraumatic. Eyes: Conjunctivae clear. Sclera anicteric. Nose: No congestion. No rhinorrhea. Mouth/Throat: Wearing mask.  No erythema of the posterior oropharynx.  No tonsillar hypertrophy or exudates. Neck: No stridor.   Cardiovascular: Tachycardic, regular rhythm. Extremities well perfused. Respiratory: Normal respiratory effort.  Lungs CTAB. Gastrointestinal: Soft. Non-tender.  Gravid. FTH in triage 170s.  Musculoskeletal: No lower extremity edema. No deformities. Neurologic:  Normal speech and language. No gross focal neurologic deficits are appreciated.  Skin: Multiple areas of nodular induration and tenderness in the bilateral axilla and groin.  No fluctuance.  No drainage.  No crepitance. Psychiatric: Mood and affect are appropriate for situation.  EKG  Personally reviewed.   Rate: 120 Rhythm: sinus Axis: normal Intervals: WNL Sinus tachycardia No STEMI   Procedures  Procedure(s) performed (including critical care):  Procedures   Initial Impression / Assessment and Plan / ED Course  19 y.o. female who presents to the ED for fever, and likely hidradenitis versus lymphadenitis in the bilateral axilla and groin area.  On exam/work up, no evidence of upper or lower extremity cellulitis, pharyngitis, or UTI to serve as drainage for reactionary lymph nodes. Could consider systemic viral process such as HIV (though recently tested negative) or COVID. Will evaluate for these. Does have hx of hidradenitis.   Labs initiated in triage reveal WBC count 16.7.  UA negative for infection.  Suspect  likely hidradenitis versus lymphadenitis.  We will plan to cover with Keflex and topical clindamycin. Receiving fluids/APAP. Plan for discharge once HR improves, follow up with OB and General Surgery for further management of hidradenitis.    Final Clinical Impression(s) / ED Diagnosis  Final diagnoses:  Fever in adult  Lymphadenopathy  Hidradenitis suppurativa       Note:  This document was prepared using Dragon voice recognition software and may include unintentional dictation errors.   Lilia Pro., MD 03/25/19 [phone]

## 2019-03-25 NOTE — ED Notes (Addendum)
Pt presents [redacted] weeks pregnant and went to OBGYN today r/t "lumps" in axilla and groin area. Pt states lumps appeared overnight and have grown today. OBGYN refeerred pt to ED because they found babies HR was elevated and pt's temperature was 100.6

## 2019-03-25 NOTE — ED Triage Notes (Addendum)
Pt here for lymphadenopathy to bilateral groin and axillar.  Left axilla most painful.  Reports temp 100.6 at obgyn, low grade temp here. Has not had tylenol. No pain other than where swollen lymph are.  Pt denies being around anyone sick.  No vomiting, tachy in triage.  Alert and oriented.  C/o general malaise. Pt reports OB told her the fetal heart tones were elevated.  FHT 170 by external doppler

## 2019-03-25 NOTE — Discharge Instructions (Signed)
Thank you for letting us take care of you in the emergency department today.   Please continue to take any regular, prescribed medications.   New medications we have prescribed:  - Keflex - oral antibiotic - Clindamycin - topical to apply to the affected areas  Please follow up with: - Your OB/GYN doctor to review your ER visit and follow up on your symptoms.  - General surgery doctor, for further management of your hidradenitis    Please return to the ER for any new or worsening symptoms.

## 2019-03-25 NOTE — ED Triage Notes (Signed)
First Nurse Note:  EDC:  07/15/2019.  G2P1.  Arrives from [facility] for ED evaluation for fever.  Per handoff from [facility], patient to be evaluated in ED prior to going to L+D for further evaluation.    Patient is AAOx3.  Skin warm and dry. No SOB/ DOE.  NAD

## 2019-03-26 ENCOUNTER — Inpatient Hospital Stay
Admission: EM | Admit: 2019-03-26 | Discharge: 2019-03-28 | DRG: 831 | Disposition: A | Discharge status: Home / Self Care | Payer: Medicaid Other

## 2019-03-26 ENCOUNTER — Other Ambulatory Visit: Payer: Self-pay

## 2019-03-26 DIAGNOSIS — Z3490 Encounter for supervision of normal pregnancy, unspecified, unspecified trimester: Secondary | ICD-10-CM

## 2019-03-26 DIAGNOSIS — O99282 Endocrine, nutritional and metabolic diseases complicating pregnancy, second trimester: Secondary | ICD-10-CM | POA: Diagnosis present

## 2019-03-26 DIAGNOSIS — O98812 Other maternal infectious and parasitic diseases complicating pregnancy, second trimester: Principal | ICD-10-CM | POA: Diagnosis present

## 2019-03-26 DIAGNOSIS — R509 Fever, unspecified: Secondary | ICD-10-CM | POA: Insufficient documentation

## 2019-03-26 DIAGNOSIS — L732 Hidradenitis suppurativa: Secondary | ICD-10-CM | POA: Diagnosis present

## 2019-03-26 DIAGNOSIS — O368120 Decreased fetal movements, second trimester, not applicable or unspecified: Secondary | ICD-10-CM | POA: Diagnosis present

## 2019-03-26 DIAGNOSIS — O99512 Diseases of the respiratory system complicating pregnancy, second trimester: Secondary | ICD-10-CM | POA: Diagnosis present

## 2019-03-26 DIAGNOSIS — O26892 Other specified pregnancy related conditions, second trimester: Secondary | ICD-10-CM | POA: Diagnosis present

## 2019-03-26 DIAGNOSIS — Z8632 Personal history of gestational diabetes: Secondary | ICD-10-CM

## 2019-03-26 DIAGNOSIS — Z20822 Contact with and (suspected) exposure to covid-19: Secondary | ICD-10-CM | POA: Diagnosis present

## 2019-03-26 DIAGNOSIS — Z6791 Unspecified blood type, Rh negative: Secondary | ICD-10-CM

## 2019-03-26 DIAGNOSIS — A419 Sepsis, unspecified organism: Secondary | ICD-10-CM | POA: Diagnosis present

## 2019-03-26 DIAGNOSIS — Z3A24 24 weeks gestation of pregnancy: Secondary | ICD-10-CM

## 2019-03-26 DIAGNOSIS — M79621 Pain in right upper arm: Secondary | ICD-10-CM | POA: Diagnosis present

## 2019-03-26 DIAGNOSIS — R000 Tachycardia, unspecified: Secondary | ICD-10-CM | POA: Diagnosis present

## 2019-03-26 DIAGNOSIS — E876 Hypokalemia: Secondary | ICD-10-CM | POA: Diagnosis present

## 2019-03-26 DIAGNOSIS — J45909 Unspecified asthma, uncomplicated: Secondary | ICD-10-CM | POA: Diagnosis present

## 2019-03-26 LAB — SAMPLE TO BLOOD BANK

## 2019-03-26 LAB — CBC
HCT: 31.9 % — ABNORMAL LOW (ref 36.0–46.0)
Hemoglobin: 10.2 g/dL — ABNORMAL LOW (ref 12.0–15.0)
MCH: 22.7 pg — ABNORMAL LOW (ref 26.0–34.0)
MCHC: 32 g/dL (ref 30.0–36.0)
MCV: 71 fL — ABNORMAL LOW (ref 80.0–100.0)
Platelets: 245 10*3/uL (ref 150–400)
RBC: 4.49 MIL/uL (ref 3.87–5.11)
RDW: 15.9 % — ABNORMAL HIGH (ref 11.5–15.5)
WBC: 16.3 10*3/uL — ABNORMAL HIGH (ref 4.0–10.5)
nRBC: 0 % (ref 0.0–0.2)

## 2019-03-26 LAB — COMPREHENSIVE METABOLIC PANEL
ALT: 12 U/L (ref 0–44)
AST: 15 U/L (ref 15–41)
Albumin: 2.8 g/dL — ABNORMAL LOW (ref 3.5–5.0)
Alkaline Phosphatase: 66 U/L (ref 38–126)
Anion gap: 10 (ref 5–15)
BUN: 5 mg/dL — ABNORMAL LOW (ref 6–20)
CO2: 18 mmol/L — ABNORMAL LOW (ref 22–32)
Calcium: 8.4 mg/dL — ABNORMAL LOW (ref 8.9–10.3)
Chloride: 104 mmol/L (ref 98–111)
Creatinine, Ser: 0.59 mg/dL (ref 0.44–1.00)
GFR calc Af Amer: >60 mL/min (ref >60)
GFR calc non Af Amer: >60 mL/min (ref >60)
Glucose, Bld: 97 mg/dL (ref 70–99)
Potassium: 3.4 mmol/L — ABNORMAL LOW (ref 3.5–5.1)
Sodium: 132 mmol/L — ABNORMAL LOW (ref 135–145)
Total Bilirubin: 0.4 mg/dL (ref 0.3–1.2)
Total Protein: 6.7 g/dL (ref 6.5–8.1)

## 2019-03-26 LAB — HIV ANTIBODY (ROUTINE TESTING W REFLEX): HIV Screen 4th Generation wRfx: NONREACTIVE

## 2019-03-26 LAB — SARS CORONAVIRUS 2 (TAT 6-24 HRS): SARS Coronavirus 2: NEGATIVE

## 2019-03-26 MED ORDER — CEPHALEXIN 500 MG PO CAPS: 500.0000 mg | ORAL_CAPSULE | Freq: Four times a day (QID) | ORAL | Status: DC

## 2019-03-26 MED ORDER — CALCIUM CARBONATE ANTACID 500 MG PO CHEW: 2.0000 | CHEWABLE_TABLET | ORAL | Status: DC | PRN

## 2019-03-26 MED ORDER — LACTATED RINGERS IV SOLN: INTRAVENOUS | Status: DC

## 2019-03-26 MED ORDER — DOCUSATE SODIUM 100 MG PO CAPS: 100.0000 mg | ORAL_CAPSULE | Freq: Every day | ORAL | Status: DC

## 2019-03-26 MED ORDER — ACETAMINOPHEN 500 MG PO TABS
1000.0000 mg | ORAL_TABLET | Freq: Four times a day (QID) | ORAL | Status: DC | PRN
  Administered 2019-03-26: 1000 mg via ORAL
  Filled 2019-03-26: qty 2

## 2019-03-26 MED ORDER — LACTATED RINGERS IV BOLUS
1000.0000 mL | Freq: Once | INTRAVENOUS | Status: AC
  Administered 2019-03-26: 1000 mL via INTRAVENOUS

## 2019-03-26 MED ORDER — ZOLPIDEM TARTRATE 5 MG PO TABS: 5.0000 mg | ORAL_TABLET | Freq: Every evening | ORAL | Status: DC | PRN

## 2019-03-26 NOTE — H&P (Signed)
OB History & Physical   History of Present Illness:  Chief Complaint: maternal fever and decreased fetal movement   HPI:  Morgan Collier is a 19 y.o. G2P0 female at [redacted]w[redacted]d dated by LMP of 10/08/2019, consistent with [place] at [redacted]w[redacted]d.  She presents to L&D for fever of 102 at home and decreased fetal movement. She was seen in the office yesterday with complaints of lymphadenopathy.  She had a fever of 100.6 and multiple areas of red, hot, and tender indurations in her axilla and groin.  She was sent to ED for evaluation of fever, general malaise, and evaluation of nodules.  Probable diagnosis of hidradenitis given, started on Keflex and topical clindamycin. Plan made to follow up with general surgery outpatient for possible I&D.  Morgan Collier states that she slept most of the day today.  She woke up feeling cold and shaky, and her temp was 102.  She was also concerned about decreased fetal movement.  She denies contractions, cramping, LOF, or vaginal bleeding.   Pregnancy Issues: 1. Rh negative  2. Varicella non-immune 3. Close interval pregnancy - last delivery 07/2018 4. Hx Chlamydia - 08/2018 5. Hx vaginal bleeding in first trimester - RhoGAM given 6. Asthma - controlled with Flovent  7. Hx of bacterial vaginosis in pregnancy - 02/23/2019  Patient Active Problem List   Diagnosis Date Noted  . Fever 03/26/2019    Maternal Medical History:   Past Medical History:  Diagnosis Date  . Asthma    Seasonal  . First trimester screening   . Gestational diabetes   . Hidradenitis suppurativa of right axilla   . Labor and delivery indication for care or intervention 08/02/2018  . Labor and delivery, indication for care 08/02/2018  . Normal labor and delivery 08/02/2018    Past Surgical History:  Procedure Laterality Date  . INCISION AND DRAINAGE ABSCESS Right 10/10/2015   Procedure: Incision and drainage of complex axillary abscess Excisions of complex hidradenitis on right axilla                            3.  Excisional debridement of skin subcutaneous tissue;  Surgeon: Jules Husbands, MD;  Location: [place];  Service: General;  Laterality: Right;  . WISDOM TOOTH EXTRACTION  2016    No Known Allergies  Prior to Admission medications   Medication Sig Start Date End Date Taking? Authorizing Provider  cephALEXin (KEFLEX) 500 MG capsule Take 1 capsule (500 mg total) by mouth 4 (four) times daily for 10 days. 03/25/19 04/04/19  Lilia Pro., MD  cetirizine (ZYRTEC) 10 MG tablet Take 10 mg by mouth daily as needed.  04/07/18 04/07/19  [provider]  clindamycin (CLINDAGEL) 1 % gel Apply to affected area 2 times daily 03/25/19 03/24/20  Lilia Pro., MD  fluticasone [facility]) 50 MCG/ACT nasal spray Place 1 spray into both nostrils daily as needed.  03/25/18   [provider]  norgestimate-ethinyl estradiol (ORTHO-CYCLEN) 0.25-35 MG-MCG tablet Take 1 tablet by mouth daily. 10/05/18   Hassell Done, FNP  PROAIR HFA [phone] Base) MCG/ACT inhaler Inhale 2 puffs into the lungs every 6 (six) hours as needed. 10/26/15   [provider]  sertraline (ZOLOFT) 100 MG tablet Take 100 mg by mouth daily.    [provider]  valACYclovir (VALTREX) 1000 MG tablet Take 1,000 mg by mouth 2 (two) times daily. PRN for onset symptoms    [provider]  Prenatal care site:  [facility]   Social History: She  reports that she has never smoked. She has never used smokeless tobacco. She reports previous alcohol use. She reports that she does not use drugs.  Family History: family history includes ADD / ADHD in her father; Diabetes in her paternal grandmother; Heart disease in her maternal grandmother; Hypertension in her maternal grandmother and paternal grandmother; Throat cancer in her maternal uncle.   Review of Systems: A full review of systems was performed and negative except as noted in the HPI.     Physical Exam:  Vital Signs: BP (!) 104/59   Pulse  (!) 130   LMP 10/08/2018 (Exact Date)   General: no acute distress.  HEENT: normocephalic, atraumatic Heart: regular rate & rhythm.  No murmurs/rubs/gallops Lungs: clear to auscultation bilaterally, normal respiratory effort Skin: Multiple areas of nodular induration and tenderness in the bilateral axilla and groin.  No fluctuance.  No drainage.  Abdomen: soft, gravid, non-tender;  Pelvic: deferred   Extremities: non-tender, symmetric, no edema bilaterally.  DTRs: 2+/2+  Neurologic: Alert & oriented x 3.    Results for orders placed or performed during the hospital encounter of 03/25/19 (from the past 24 hour(s))  POC SARS Coronavirus 2 Ag     Status: None   Collection Time: 03/25/19 11:57 PM  Result Value Ref Range   SARS Coronavirus 2 Ag NEGATIVE NEGATIVE    Pertinent Results:  Prenatal Labs: Blood type/Rh A neg  Antibody screen neg  Rubella Immune  Varicella Non-immune   RPR NR  HBsAg Neg  HIV NR  GC neg  Chlamydia neg  Genetic screening negative  1 hour GTT Not done yet  3 hour GTT N/A  GBS N/A   FHT: 170bpm  TOCO: uterine irritability  SVE: deferred   Assessment:  Morgan Collier is a 20 y.o. G2P0 female at [redacted]w[redacted]d with fever and maternal tachycardia.   Plan:  1. Place in observation for antepartum status d/t fever and maternal tachycardia  Dr. Dalbert Garnet updated on plan of care  Will monitor in L&D and plan to transfer to M/B  Tylenol 1000mg  q6hr for fever   Continuous pulse ox - maternal tachycardia  Will consult with hospitalist given fever and tachycardia  Surgery consult in AM  2. Fetal Well being   Fetal Tracing: cat II d/t fetal tachycardia   Will give fluid bolus and treat fever with Tylenol.   3. Antepartum status   Prenatal labs reviewed, as above  Rh neg  CBC, T&S, RPR, Blood cultures x 2, CMP  UA and urine culture  LR IVF bolus then 120ml/hr   FHT q shift   NPO after midnight   45m, Gustavo Lah 03/26/19 10:41 PM  03/28/19, CNM Certified Nurse Midwife [facility]

## 2019-03-26 NOTE — ED Provider Notes (Signed)
Patient with mild tachycardia currently, heart rate is 102 bpm.  Patient states she feels well enough to go home.  We will discharge with close outpatient follow-up.   Emily Filbert, MD 03/26/19 [phone]

## 2019-03-27 ENCOUNTER — Encounter: Payer: Self-pay | Admitting: Obstetrics and Gynecology

## 2019-03-27 DIAGNOSIS — M79621 Pain in right upper arm: Secondary | ICD-10-CM | POA: Diagnosis present

## 2019-03-27 DIAGNOSIS — O99282 Endocrine, nutritional and metabolic diseases complicating pregnancy, second trimester: Secondary | ICD-10-CM | POA: Diagnosis present

## 2019-03-27 DIAGNOSIS — Z3490 Encounter for supervision of normal pregnancy, unspecified, unspecified trimester: Secondary | ICD-10-CM | POA: Diagnosis not present

## 2019-03-27 DIAGNOSIS — E876 Hypokalemia: Secondary | ICD-10-CM | POA: Diagnosis present

## 2019-03-27 DIAGNOSIS — Z6791 Unspecified blood type, Rh negative: Secondary | ICD-10-CM | POA: Diagnosis not present

## 2019-03-27 DIAGNOSIS — O98812 Other maternal infectious and parasitic diseases complicating pregnancy, second trimester: Secondary | ICD-10-CM | POA: Diagnosis not present

## 2019-03-27 DIAGNOSIS — O26892 Other specified pregnancy related conditions, second trimester: Secondary | ICD-10-CM | POA: Diagnosis present

## 2019-03-27 DIAGNOSIS — J45909 Unspecified asthma, uncomplicated: Secondary | ICD-10-CM | POA: Diagnosis present

## 2019-03-27 DIAGNOSIS — L732 Hidradenitis suppurativa: Secondary | ICD-10-CM | POA: Diagnosis not present

## 2019-03-27 DIAGNOSIS — Z3A24 24 weeks gestation of pregnancy: Secondary | ICD-10-CM | POA: Diagnosis not present

## 2019-03-27 DIAGNOSIS — A419 Sepsis, unspecified organism: Secondary | ICD-10-CM | POA: Diagnosis not present

## 2019-03-27 DIAGNOSIS — O368120 Decreased fetal movements, second trimester, not applicable or unspecified: Secondary | ICD-10-CM | POA: Diagnosis not present

## 2019-03-27 DIAGNOSIS — R000 Tachycardia, unspecified: Secondary | ICD-10-CM | POA: Diagnosis present

## 2019-03-27 DIAGNOSIS — Z20822 Contact with and (suspected) exposure to covid-19: Secondary | ICD-10-CM | POA: Diagnosis present

## 2019-03-27 DIAGNOSIS — Z8632 Personal history of gestational diabetes: Secondary | ICD-10-CM | POA: Diagnosis not present

## 2019-03-27 DIAGNOSIS — O99512 Diseases of the respiratory system complicating pregnancy, second trimester: Secondary | ICD-10-CM | POA: Diagnosis present

## 2019-03-27 LAB — URINALYSIS, COMPLETE (UACMP) WITH MICROSCOPIC
Bilirubin Urine: NEGATIVE
Glucose, UA: NEGATIVE mg/dL
Hgb urine dipstick: NEGATIVE
Ketones, ur: NEGATIVE mg/dL
Nitrite: NEGATIVE
Protein, ur: NEGATIVE mg/dL
Specific Gravity, Urine: 1.003 — ABNORMAL LOW (ref 1.005–1.030)
pH: 6 (ref 5.0–8.0)

## 2019-03-27 LAB — LACTIC ACID, PLASMA
Lactic Acid, Venous: 0.7 mmol/L (ref 0.5–1.9)
Lactic Acid, Venous: 1 mmol/L (ref 0.5–1.9)

## 2019-03-27 LAB — PROTIME-INR
INR: 1.2 (ref 0.8–1.2)
Prothrombin Time: 15.2 seconds (ref 11.4–15.2)

## 2019-03-27 LAB — PROCALCITONIN: Procalcitonin: <0.1 ng/mL

## 2019-03-27 MED ORDER — POTASSIUM CHLORIDE CRYS ER 20 MEQ PO TBCR
20.0000 meq | EXTENDED_RELEASE_TABLET | Freq: Once | ORAL | Status: AC
  Administered 2019-03-27: 20 meq via ORAL
  Filled 2019-03-27: qty 1

## 2019-03-27 MED ORDER — OXYCODONE HCL 5 MG PO TABS
5.0000 mg | ORAL_TABLET | Freq: Four times a day (QID) | ORAL | Status: DC | PRN
  Administered 2019-03-27 (×2): 5 mg via ORAL
  Filled 2019-03-27 (×3): qty 1

## 2019-03-27 MED ORDER — CEPHALEXIN 500 MG PO CAPS
500.0000 mg | ORAL_CAPSULE | Freq: Once | ORAL | Status: AC
  Administered 2019-03-27: 500 mg via ORAL
  Filled 2019-03-27: qty 1

## 2019-03-27 MED ORDER — ALBUTEROL SULFATE (2.5 MG/3ML) 0.083% IN NEBU: 2.5000 mg | INHALATION_SOLUTION | Freq: Four times a day (QID) | RESPIRATORY_TRACT | Status: DC | PRN

## 2019-03-27 MED ORDER — CEPHALEXIN 500 MG PO CAPS
500.0000 mg | ORAL_CAPSULE | Freq: Four times a day (QID) | ORAL | Status: DC
  Administered 2019-03-27: 500 mg via ORAL
  Filled 2019-03-27 (×2): qty 1

## 2019-03-27 MED ORDER — LORATADINE 10 MG PO TABS
10.0000 mg | ORAL_TABLET | Freq: Every day | ORAL | Status: DC
  Administered 2019-03-28: 10 mg via ORAL
  Filled 2019-03-27 (×2): qty 1

## 2019-03-27 MED ORDER — SODIUM CHLORIDE 0.9 % IV BOLUS
1000.0000 mL | Freq: Once | INTRAVENOUS | Status: AC
  Administered 2019-03-27: 1000 mL via INTRAVENOUS

## 2019-03-27 MED ORDER — CLINDAMYCIN PHOSPHATE 1 % EX GEL: Freq: Two times a day (BID) | CUTANEOUS | Status: DC

## 2019-03-27 MED ORDER — VANCOMYCIN HCL IN DEXTROSE 750-5 MG/150ML-% IV SOLN
750.0000 mg | Freq: Two times a day (BID) | INTRAVENOUS | Status: DC
  Administered 2019-03-27 – 2019-03-28 (×2): 750 mg via INTRAVENOUS
  Filled 2019-03-27 (×4): qty 150

## 2019-03-27 MED ORDER — ACETAMINOPHEN 500 MG PO TABS
1000.0000 mg | ORAL_TABLET | Freq: Four times a day (QID) | ORAL | Status: DC
  Administered 2019-03-27: 1000 mg via ORAL
  Filled 2019-03-27: qty 2

## 2019-03-27 MED ORDER — SERTRALINE HCL 100 MG PO TABS
100.0000 mg | ORAL_TABLET | Freq: Every day | ORAL | Status: DC
  Filled 2019-03-27: qty 1

## 2019-03-27 MED ORDER — SERTRALINE HCL 100 MG PO TABS
100.0000 mg | ORAL_TABLET | Freq: Every day | ORAL | Status: DC
  Administered 2019-03-27: 100 mg via ORAL

## 2019-03-27 MED ORDER — VANCOMYCIN HCL 1.5 G IV SOLR
1500.0000 mg | Freq: Once | INTRAVENOUS | Status: AC
  Administered 2019-03-27: 1500 mg via INTRAVENOUS
  Filled 2019-03-27: qty 1500

## 2019-03-27 MED ORDER — SODIUM CHLORIDE 0.9 % IV SOLN
1.0000 g | INTRAVENOUS | Status: DC
  Administered 2019-03-27 – 2019-03-28 (×2): 1 g via INTRAVENOUS
  Filled 2019-03-27 (×2): qty 10
  Filled 2019-03-27: qty 1

## 2019-03-27 MED ORDER — SODIUM CHLORIDE 0.9 % IV SOLN: INTRAVENOUS | Status: DC

## 2019-03-27 MED ORDER — ACETAMINOPHEN 325 MG PO TABS
650.0000 mg | ORAL_TABLET | Freq: Four times a day (QID) | ORAL | Status: DC
  Administered 2019-03-27 – 2019-03-28 (×4): 650 mg via ORAL
  Filled 2019-03-27 (×4): qty 2

## 2019-03-27 NOTE — Progress Notes (Signed)
Ice Provided for right under arm Pain and Bilat Groin Pain. Oxycodone administered as per order as well as ordered Keflex.

## 2019-03-27 NOTE — Progress Notes (Signed)
Spoke with Dr. Everlene Farrier at bedside, Will reassess pt tomorrow, no plans for surgery at this time. Verbal order placed for regular diet.

## 2019-03-27 NOTE — Consult Note (Signed)
Patient ID: Morgan Collier, female   DOB: 06/06/2000, 19 y.o.   MRN: [id number]  HPI Morgan Collier is a 19 y.o. female seen in consultation at the request of [name] CNM for hidradenitis.  Patient was admitted today with tachycardia fevers and mainly right axillary pain.  The pain was severe intermittent and sharp in nature.  She does have a long history of hidradenitis on both axillas and the groins but has always been worse on the right axilla.  She has received crystalloids and broad-spectrum antibiotics and the patient feels more comfortable.  She is  [redacted] weeks pregnant.  CBC shows a white count of 16 hemoglobin of 10 platelets of 245.  CMP with low albumin and mild acidosis.  Lactic acid was normal. She had previous excisional debridement by me close to 4 years ago. She denies any vaginal bleeding contractions or leakage of fluid.  HPI  Past Medical History:  Diagnosis Date  . Asthma    Seasonal  . First trimester screening   . Gestational diabetes   . Hidradenitis suppurativa of right axilla   . Labor and delivery indication for care or intervention 08/02/2018  . Labor and delivery, indication for care 08/02/2018  . Normal labor and delivery 08/02/2018    Past Surgical History:  Procedure Laterality Date  . INCISION AND DRAINAGE ABSCESS Right 10/10/2015   Procedure: Incision and drainage of complex axillary abscess Excisions of complex hidradenitis on right axilla                            3. Excisional debridement of skin subcutaneous tissue;  Surgeon: Jules Husbands, MD;  Location: [place];  Service: General;  Laterality: Right;  . WISDOM TOOTH EXTRACTION  2016    Family History  Problem Relation Age of Onset  . Throat cancer Maternal Uncle   . Heart disease Maternal Grandmother   . Hypertension Maternal Grandmother   . ADD / ADHD Father   . Hypertension Paternal Grandmother   . Diabetes Paternal Grandmother     Social History Social History   Tobacco Use  .  Smoking status: Never Smoker  . Smokeless tobacco: Never Used  Substance Use Topics  . Alcohol use: Not Currently    Comment: occ  . Drug use: No    No Known Allergies  Current Facility-Administered Medications  Medication Dose Route Frequency Provider Last Rate Last Admin  . 0.9 %  sodium chloride infusion   Intravenous Continuous Ivor Costa, MD      . acetaminophen (TYLENOL) tablet 650 mg  650 mg Oral Q6H Ivor Costa, MD      . albuterol (PROVENTIL) (2.5 MG/3ML) 0.083% nebulizer solution 2.5 mg  2.5 mg Inhalation Q6H PRN Minda Meo, CNM      . calcium carbonate (TUMS - dosed in mg elemental calcium) chewable tablet 400 mg of elemental calcium  2 tablet Oral Q4H PRN Minda Meo, CNM      . cefTRIAXone (ROCEPHIN) 1 g in sodium chloride 0.9 % 100 mL IVPB  1 g Intravenous Q24H Ivor Costa, MD 200 mL/hr at 03/27/19 1025 1 g at 03/27/19 1025  . clindamycin (CLINDAGEL) 1 % gel   Topical BID Minda Meo, CNM      . lactated ringers infusion   Intravenous Continuous Minda Meo, CNM 150 mL/hr at 03/27/19 0539 Rate Verify at 03/27/19 0508  . loratadine (CLARITIN) tablet 10 mg  10  mg Oral Daily Gustavo Lah, CNM      . oxyCODONE (Oxy IR/ROXICODONE) immediate release tablet 5 mg  5 mg Oral Q6H PRN Gustavo Lah, CNM   5 mg at 03/27/19 0155  . sertraline (ZOLOFT) tablet 100 mg  100 mg Oral Q2000 Gustavo Lah, CNM      . sodium chloride 0.9 % bolus 1,000 mL  1,000 mL Intravenous Once Lorretta Harp, MD      . Vancomycin (VANCOCIN) 1,500 mg in sodium chloride 0.9 % 500 mL IVPB  1,500 mg Intravenous Once Albina Billet, RPH 250 mL/hr at 03/27/19 1134 1,500 mg at 03/27/19 1134   Followed by  . vancomycin (VANCOCIN) IVPB 750 mg/150 ml premix  750 mg Intravenous Q12H Shanlever, Charles M, RPH      . zolpidem (AMBIEN) tablet 5 mg  5 mg Oral QHS PRN Gustavo Lah, CNM         Review of Systems Full ROS  was asked and was negative except for the information on the HPI  Physical  Exam Blood pressure 110/64, pulse 76, temperature 97.6 F (36.4 C), temperature source Oral, resp. rate 16, height 5\' 2"  (1.575 m), weight 72.6 kg, last menstrual period 10/08/2018, SpO2 98 %, not currently breastfeeding. CONSTITUTIONAL: NAD EYES: Pupils are equal, round, and reactive to light, Sclera are non-icteric. EARS, NOSE, MOUTH AND THROAT: The oropharynx is clear. The oral mucosa is pink and moist. Hearing is intact to voice. LYMPH NODES:  Lymph nodes in the neck are normal. RESPIRATORY:  Lungs are clear. There is normal respiratory effort, with equal breath sounds bilaterally, and without pathologic use of accessory muscles. CARDIOVASCULAR: Heart is regular without murmurs, gallops, or rubs. GI: The abdomen is  soft, nontender, and nondistended.  Intrauterine pregnancy present.  There are no palpable masses. There is no hepatosplenomegaly. There are normal bowel sounds in all quadrants. GU: Rectal deferred.   MUSCULOSKELETAL: Normal muscle strength and tone. No cyanosis or edema.   SKIN: Right axilla with evidence of couple of areas of induration.  There is no fluctuance.  There is no evidence of necrotizing infection.  There is no open wounds or drainage.  Left axilla mildly tender.  Bilateral groins with some inactive hidradenitis without any abscesses.   NEUROLOGIC: Motor and sensation is grossly normal. Cranial nerves are grossly intact. PSYCH:  Oriented to person, place and time. Affect is normal.  Data Reviewed  I have personally reviewed the patient's imaging, laboratory findings and medical records.    Assessment/Plan 19 year old female with hidradenitis worse on the right axilla.  Currently there is no evidence of fluctuance abscess or necrotizing infection.  Agree with broad-spectrum antibiotics.  No need for surgical intervention at this time.  We will do another check tomorrow to make sure she does not require anything else.  Discussed with nursing staff and with provider in  detail.    34, MD FACS General Surgeon 03/27/2019, 12:54 PM

## 2019-03-27 NOTE — Progress Notes (Addendum)
ANTEPARTUM PROGRESS NOTE  Morgan Collier is a 19 y.o. G2P1001 at [redacted]w[redacted]d who is admitted for fever (102.2) and maternal tachycardia.   Estimated Date of Delivery: 07/15/19  Length of Stay:  0 Days. Admitted 03/26/2019  Subjective: Resting in bed. States she feels better after fluid bolus and tylenol.  Rates pain 6/10, down from 10/10 r/t nodule in axilla area.    She reports:  -active fetal movement -no leakage of fluid -no vaginal bleeding -no contractions  Vitals:  BP (!) 112/59 (BP Location: Left Arm)   Pulse 80   Temp 97.8 F (36.6 C) (Oral)   Resp 20   Ht 5\' 2"  (1.575 m)   Wt 72.6 kg   LMP 10/08/2018 (Exact Date)   SpO2 100%   BMI 29.26 kg/m  Physical Examination: General:   alert, cooperative and no distress  Skin:  normal and warm, diaphoretic (post-fever sweating) Multiple areas of nodular induration and tenderness in the bilateral axilla and groin. No fluctuance. No drainage.  Neurologic:    Alert & oriented x 3  Lungs:   clear to auscultation bilaterally  Heart:   regular rate and rhythm, S1, S2 normal, no murmur, click, rub or gallop  Abdomen:  soft, non-tender; bowel sounds normal; no masses,  no organomegaly  Pelvis:  Exam deferred.  FHT: 150 BPM  Extremities: : non-tender, symmetric, No edema bilaterally.  DTRs: 2+/2+     Results for orders placed or performed during the hospital encounter of 03/26/19 (from the past 48 hour(s))  CBC     Status: Abnormal   Collection Time: 03/26/19 10:23 PM  Result Value Ref Range   WBC 16.3 (H) 4.0 - 10.5 K/uL   RBC 4.49 3.87 - 5.11 MIL/uL   Hemoglobin 10.2 (L) 12.0 - 15.0 g/dL   HCT 03/28/19 (L) 35.3 - 61.4 %   MCV 71.0 (L) 80.0 - 100.0 fL   MCH 22.7 (L) 26.0 - 34.0 pg   MCHC 32.0 30.0 - 36.0 g/dL   RDW 43.1 (H) 54.0 - 08.6 %   Platelets 245 150 - 400 K/uL   nRBC 0.0 0.0 - 0.2 %    Comment: Performed at [facility], [address] [place], [place]  Comprehensive metabolic panel     Status: Abnormal    Collection Time: 03/26/19 10:23 PM  Result Value Ref Range   Sodium 132 (L) 135 - 145 mmol/L   Potassium 3.4 (L) 3.5 - 5.1 mmol/L   Chloride 104 98 - 111 mmol/L   CO2 18 (L) 22 - 32 mmol/L   Glucose, Bld 97 70 - 99 mg/dL    Comment: Glucose reference range applies only to samples taken after fasting for at least 8 hours.   BUN 5 (L) 6 - 20 mg/dL   Creatinine, Ser 03/28/19 0.44 - 1.00 mg/dL   Calcium 8.4 (L) 8.9 - 10.3 mg/dL   Total Protein 6.7 6.5 - 8.1 g/dL   Albumin 2.8 (L) 3.5 - 5.0 g/dL   AST 15 15 - 41 U/L   ALT 12 0 - 44 U/L   Alkaline Phosphatase 66 38 - 126 U/L   Total Bilirubin 0.4 0.3 - 1.2 mg/dL   GFR calc non Af Amer >60 >60 mL/min   GFR calc Af Amer >60 >60 mL/min   Anion gap 10 5 - 15    Comment: Performed at [facility], [address], [place], [place]  Sample to Blood Bank  Status: None   Collection Time: 03/26/19 10:23 PM  Result Value Ref Range   Blood Bank Specimen SAMPLE AVAILABLE FOR TESTING    Sample Expiration      03/29/2019,2359 Performed at [facility], [address] [place], [place] [address]   Urinalysis, Complete w Microscopic     Status: Abnormal   Collection Time: 03/26/19 11:49 PM  Result Value Ref Range   Color, Urine STRAW (A) YELLOW   APPearance CLEAR (A) CLEAR   Specific Gravity, Urine 1.003 (L) 1.005 - 1.030   pH 6.0 5.0 - 8.0   Glucose, UA NEGATIVE NEGATIVE mg/dL   Hgb urine dipstick NEGATIVE NEGATIVE   Bilirubin Urine NEGATIVE NEGATIVE   Ketones, ur NEGATIVE NEGATIVE mg/dL   Protein, ur NEGATIVE NEGATIVE mg/dL   Nitrite NEGATIVE NEGATIVE   Leukocytes,Ua TRACE (A) NEGATIVE   WBC, UA 0-5 0 - 5 WBC/hpf   Bacteria, UA RARE (A) NONE SEEN   Squamous Epithelial / LPF 0-5 0 - 5    Comment: Performed at [facility], [address] [place], [place] [address]  Culture, blood (routine x 2)     Status: None (Preliminary result)   Collection Time: 03/27/19 12:07 AM   Specimen: BLOOD  Result  Value Ref Range   Specimen Description BLOOD LEFT ANTECUBITAL    Special Requests      BOTTLES DRAWN AEROBIC AND ANAEROBIC Blood Culture adequate volume   Culture      NO GROWTH < 12 HOURS Performed at [facility], [address], [place], [place] [address]    Report Status PENDING   Culture, blood (routine x 2)     Status: None (Preliminary result)   Collection Time: 03/27/19 12:07 AM   Specimen: BLOOD  Result Value Ref Range   Specimen Description BLOOD LEFT HAND    Special Requests      BOTTLES DRAWN AEROBIC AND ANAEROBIC Blood Culture adequate volume   Culture      NO GROWTH < 12 HOURS Performed at [facility], [address], [place], [place] [address]    Report Status PENDING   Lactic acid, plasma     Status: None   Collection Time: 03/27/19  9:35 AM  Result Value Ref Range   Lactic Acid, Venous 0.7 0.5 - 1.9 mmol/L    Comment: Performed at [facility], [address] [place], [place] [address]  Protime-INR     Status: None   Collection Time: 03/27/19  9:35 AM  Result Value Ref Range   Prothrombin Time 15.2 11.4 - 15.2 seconds   INR 1.2 0.8 - 1.2    Comment: (NOTE) INR goal varies based on device and disease states. Performed at [facility], [address] [place], [place] [address]     Current scheduled medications . acetaminophen  1,000 mg Oral Q6H  . clindamycin   Topical BID  . loratadine  10 mg Oral Daily  . sertraline  100 mg Oral Daily    I have reviewed the patient's current medications.  ASSESSMENT: Patient Active Problem List   Diagnosis Date Noted  . Fever 03/26/2019  . Supervision of normal pregnancy 11/26/2018  . Hidradenitis suppurativa of right axilla 10/28/2016    PLAN: Consult with hospitalist - Dr. Clyde Lundborg  Initiated sepsis bundle   Antibiotics changed to vancomycin and rocephin  Consult with general surgery - pending   Evaluate for I&D Continue routine antenatal care.- FHT q shift  Dr.  Dalbert Garnet updated  on plan of care   Terance Ice 03/27/2019 10:43 AM  Drinda Butts  Certified Nurse Midwife [facility]

## 2019-03-27 NOTE — Consult Note (Signed)
Pharmacy Antibiotic Note  Morgan Collier is a 19 y.o. female admitted on 03/26/2019 with hidradenitis/sepsis.  Pharmacy has been consulted for Vancomycin dosing.  Plan: Will start with Vancomycin 1500mg  IV to load, will follow with  Vancomycin 750 mg IV Q 12 hrs. Goal AUC 400-550. Expected AUC: 533 SCr used: 0.8 (0.59 actual)   Height: 5\' 2"  (157.5 cm) Weight: 160 lb (72.6 kg) IBW/kg (Calculated) : 50.1  Temp (24hrs), Avg:99.5 F (37.5 C), Min:97.8 F (36.6 C), Max:102.2 F (39 C)  Recent Labs  Lab 03/25/19 1609 03/26/19 2223  WBC 16.7* 16.3*  CREATININE 0.55 0.59    Estimated Creatinine Clearance: 103.8 mL/min (by C-G formula based on SCr of 0.59 mg/dL).    No Known Allergies  Antimicrobials this admission: Cephalexin - 2/28 Ceftriaxone 2.28 >> Vancomycin 2/28 >>  Dose adjustments this admission: None  Microbiology results: 2/28 BCx: pending 2/28 UCx: pending   Thank you for allowing pharmacy to be a part of this patient's care.  3/28, PharmD, BCPS Clinical Pharmacist 03/27/2019 9:42 AM

## 2019-03-27 NOTE — Plan of Care (Addendum)
Pt. Transferred to room 345. Oriented to Room, POC and Safety and Security. Pt. States positive Fetal movement, denies contraction, leaking of fluid and/or bleeding. FHT by Doppler is 144.

## 2019-03-27 NOTE — Consult Note (Signed)
Medical Consultation   Morgan Collier  EXN:[id number]  DOB: Mar 30, 2000  DOA: 03/26/2019  PCP: Patient, No Pcp Per   Outpatient Specialists:   Requesting physician: -OB/Gyn: Margaretmary Eddy  Reason for consultation: -Fever, hidradenitis    History of Present Illness: Morgan Collier is an 19 y.o. female asthma, pregnancy (G2P0 female at [redacted]w[redacted]d dated by LMP of 10/08/2019, consistent with [place] at [redacted]w[redacted]d), who is admitted by Ob/Gyn due to fever and hidradenitis. We are asked to consult.  Pt has been having fever, chill and painful induration in bilateral axillary and groin areas in the past two days.  She had fever of 102 at home and multiple areas of red, hot, and tender indurations in her axilla and groin. She states that the right axillary pain is worst, which is constant, sharp, 10 out of 10 severity, nonradiating.  No active drainage. Pt was seen in ED yesterday and diagnosed as hidradenitis.  She was a started on started on Keflex and topical clindamycin, but no improvement. Plan is made to follow up with general surgery outpatient for possible I&D. She continues to have a fever, generalized malaise, chills.  She continues to have severe pain in bilateral axillary and groin areas. She also report that she feel decreased fetal movement, but per Ob/Gyn, Margaretmary Eddy, the fetal condition is fine.  She denies contractions, cramping, LOF, or vaginal bleeding.  Patient does not have chest pain, cough, shortness breath.  No nausea vomiting, diarrhea, abdominal pain, symptoms of UTI or unilateral weakness.   Review of Systems:  General: has fevers, chills, no changes in body weight, no changes in appetite Skin: has multiple areas of red, hot, and tender indurations in her axilla and groin areas bilaterally HEENT: no blurry vision, hearing changes or sore throat Pulm: no dyspnea, coughing, wheezing CV: no chest pain, palpitations, shortness of breath Abd: no nausea/vomiting, abdominal  pain, diarrhea/constipation GU: no dysuria, hematuria, polyuria Ext: no arthralgias, myalgias Neuro: no weakness, numbness, or tingling    Past Medical History: Past Medical History:  Diagnosis Date  . Asthma    Seasonal  . First trimester screening   . Gestational diabetes   . Hidradenitis suppurativa of right axilla   . Labor and delivery indication for care or intervention 08/02/2018  . Labor and delivery, indication for care 08/02/2018  . Normal labor and delivery 08/02/2018    Past Surgical History: Past Surgical History:  Procedure Laterality Date  . INCISION AND DRAINAGE ABSCESS Right 10/10/2015   Procedure: Incision and drainage of complex axillary abscess Excisions of complex hidradenitis on right axilla                            3. Excisional debridement of skin subcutaneous tissue;  Surgeon: Leafy Ro, MD;  Location: [facility];  Service: General;  Laterality: Right;  . WISDOM TOOTH EXTRACTION  2016     Allergies:  No Known Allergies   Social History:  reports that she has never smoked. She has never used smokeless tobacco. She reports previous alcohol use. She reports that she does not use drugs.   Family History: Family History  Problem Relation Age of Onset  . Throat cancer Maternal Uncle   . Heart disease Maternal Grandmother   . Hypertension Maternal Grandmother   . ADD / ADHD Father   . Hypertension Paternal Grandmother   .  Diabetes Paternal Grandmother     Physical Exam: Vitals:   03/27/19 0309 03/27/19 0900 03/27/19 1100 03/27/19 1201  BP: (!) 112/59  110/64   Pulse: 80 75 76   Resp: 20  18 16   Temp: 97.8 F (36.6 C)  97.6 F (36.4 C) 97.6 F (36.4 C)  TempSrc: Oral  Oral Oral  SpO2: 100% 99% 98%   Weight:      Height:       General: Not in acute distress HEENT: PERRL, EOMI, no scleral icterus, No JVD or bruit Cardiac: S1/S2, RRR, No murmurs, gallops or rubs Pulm: Clear to auscultation bilaterally. No rales, wheezing, rhonchi  or rubs. Abd: Soft, nondistended, nontender, no rebound pain, no organomegaly, BS present Ext: No edema. 2+DP/PT pulse bilaterally Musculoskeletal: No joint deformities, erythema, or stiffness, ROM full Skin: has multiple areas of red, hot, and tender indurations in her axilla and groin areas bilaterally. No active drainage. Neuro: Alert and oriented X3, cranial nerves II-XII grossly intact, muscle strength 5/5 in all extremeties. Psych: Patient is not psychotic, no suicidal or hemocidal ideation.  Data reviewed:  I have personally reviewed following labs and imaging studies Labs:  CBC: Recent Labs  Lab 03/25/19 1609 03/26/19 2223  WBC 16.7* 16.3*  NEUTROABS 13.7*  --   HGB 11.5* 10.2*  HCT 36.0 31.9*  MCV 71.4* 71.0*  PLT 265 245    Basic Metabolic Panel: Recent Labs  Lab 03/25/19 1609 03/26/19 2223  NA 132* 132*  K 3.6 3.4*  CL 102 104  CO2 23 18*  GLUCOSE 78 97  BUN <5* 5*  CREATININE 0.55 0.59  CALCIUM 8.6* 8.4*   GFR Estimated Creatinine Clearance: 103.8 mL/min (by C-G formula based on SCr of 0.59 mg/dL). Liver Function Tests: Recent Labs  Lab 03/25/19 1609 03/26/19 2223  AST 14* 15  ALT 13 12  ALKPHOS 73 66  BILITOT 0.6 0.4  PROT 7.7 6.7  ALBUMIN 3.3* 2.8*   No results for input(s): LIPASE, AMYLASE in the last 168 hours. No results for input(s): AMMONIA in the last 168 hours. Coagulation profile Recent Labs  Lab 03/27/19 0935  INR 1.2    Cardiac Enzymes: No results for input(s): CKTOTAL, CKMB, CKMBINDEX, TROPONINI in the last 168 hours. BNP: Invalid input(s): POCBNP CBG: No results for input(s): GLUCAP in the last 168 hours. D-Dimer No results for input(s): DDIMER in the last 72 hours. Hgb A1c No results for input(s): HGBA1C in the last 72 hours. Lipid Profile No results for input(s): CHOL, HDL, LDLCALC, TRIG, CHOLHDL, LDLDIRECT in the last 72 hours. Thyroid function studies No results for input(s): TSH, T4TOTAL, T3FREE, THYROIDAB in the  last 72 hours.  Invalid input(s): FREET3 Anemia work up No results for input(s): VITAMINB12, FOLATE, FERRITIN, TIBC, IRON, RETICCTPCT in the last 72 hours. Urinalysis    Component Value Date/Time   COLORURINE STRAW (A) 03/26/2019 2349   APPEARANCEUR CLEAR (A) 03/26/2019 2349   LABSPEC 1.003 (L) 03/26/2019 2349   PHURINE 6.0 03/26/2019 2349   GLUCOSEU NEGATIVE 03/26/2019 2349   HGBUR NEGATIVE 03/26/2019 2349   BILIRUBINUR NEGATIVE 03/26/2019 2349   KETONESUR NEGATIVE 03/26/2019 2349   PROTEINUR NEGATIVE 03/26/2019 2349   NITRITE NEGATIVE 03/26/2019 2349   LEUKOCYTESUR TRACE (A) 03/26/2019 2349     Microbiology Recent Results (from the past 240 hour(s))  SARS CORONAVIRUS 2 (TAT 6-24 HRS) Nasopharyngeal Nasopharyngeal Swab     Status: None   Collection Time: 03/25/19 10:28 PM   Specimen: Nasopharyngeal Swab  Result  Value Ref Range Status   SARS Coronavirus 2 NEGATIVE NEGATIVE Final    Comment: (NOTE) SARS-CoV-2 target nucleic acids are NOT DETECTED. The SARS-CoV-2 RNA is generally detectable in upper and lower respiratory specimens during the acute phase of infection. Negative results do not preclude SARS-CoV-2 infection, do not rule out co-infections with other pathogens, and should not be used as the sole basis for treatment or other patient management decisions. Negative results must be combined with clinical observations, patient history, and epidemiological information. The expected result is Negative. Fact Sheet for Patients: HairSlick.no Fact Sheet for Healthcare Providers: quierodirigir.com This test is not yet approved or cleared by the [place] FDA and  has been authorized for detection and/or diagnosis of SARS-CoV-2 by FDA under an Emergency Use Authorization (EUA). This EUA will remain  in effect (meaning this test can be used) for the duration of the COVID-19 declaration under Section 56 4(b)(1) of the  Act, 21 U.S.C. section 360bbb-3(b)(1), unless the authorization is terminated or revoked sooner. Performed at [facility], [address] [place], [place] [address]   Culture, blood (routine x 2)     Status: None (Preliminary result)   Collection Time: 03/27/19 12:07 AM   Specimen: BLOOD  Result Value Ref Range Status   Specimen Description BLOOD LEFT ANTECUBITAL  Final   Special Requests   Final    BOTTLES DRAWN AEROBIC AND ANAEROBIC Blood Culture adequate volume   Culture   Final    NO GROWTH < 12 HOURS Performed at [facility], [address] [place], [place] [address]    Report Status PENDING  Incomplete  Culture, blood (routine x 2)     Status: None (Preliminary result)   Collection Time: 03/27/19 12:07 AM   Specimen: BLOOD  Result Value Ref Range Status   Specimen Description BLOOD LEFT HAND  Final   Special Requests   Final    BOTTLES DRAWN AEROBIC AND ANAEROBIC Blood Culture adequate volume   Culture   Final    NO GROWTH < 12 HOURS Performed at [facility], [address] [place], [place] [address]    Report Status PENDING  Incomplete       Inpatient Medications:   Scheduled Meds: . acetaminophen  650 mg Oral Q6H  . clindamycin   Topical BID  . loratadine  10 mg Oral Daily  . sertraline  100 mg Oral Q2000   Continuous Infusions: . sodium chloride    . cefTRIAXone (ROCEPHIN)  IV 1 g (03/27/19 1025)  . lactated ringers 150 mL/hr at 03/27/19 0508  . sodium chloride    . vancomycin 1,500 mg (03/27/19 1134)   Followed by  . vancomycin       Radiological Exams on Admission: No results found.  Impression/Recommendations Active Problems:   Hidradenitis suppurativa of right axilla   Supervision of normal pregnancy   Sepsis (HCC)   Hypokalemia   Asthma   Sepsis due to hidradenitis suppurativa of right axilla and groin areas: Patient meets criteria for sepsis with leukocytosis with WBC 16.3, fever, tachycardia.  Currently  hemodynamically stable.  Patient failed outpatient oral and topical antibiotic treatment.   -will switch keflex to IV vancomycin and Rocephin -Blood culture -As needed oxycodone for pain -As needed Tylenol for fever  -will get Procalcitonin and trend lactic acid levels per sepsis protocol. -IVF: pt received 1L of LR early, will give 1L of NS bolus, then 100 cc/h of NS -surgeon will by consulted by  Ob/Gyn, Drinda Butts (I clarified with her that I do not need to co-surgeon). -I discussed with Edilia Bo about all newly added medications, which is appropriate in terms of pregnancy  Hypokalemia: K= 3.4 on admission. - Repleted - Check Mg level  Asthma: stable -continue home prn albuerol  Supervision of normal pregnancy:  -Management per primary Ob/Gny team    Thank you for this consultation.  Our [facility] hospitalist team will follow the patient with you.   Time Spent:  30 min  Ivor Costa M.D. [facility] 03/27/2019, 12:27 PM

## 2019-03-27 NOTE — Consult Note (Signed)
CODE SEPSIS - PHARMACY COMMUNICATION  **Broad Spectrum Antibiotics should be administered within 1 hour of Sepsis diagnosis**  Time Code Sepsis Called/Page Received: 0926  Antibiotics Ordered: Ceftriaxone/Vancomycin  Time of 1st antibiotic administration: 1025  Additional action taken by pharmacy: called floor  If necessary, Name of Provider/Nurse Contacted: /n/a  Albina Billet, PharmD, BCPS Clinical Pharmacist 03/27/2019 10:42 AM

## 2019-03-27 NOTE — Progress Notes (Addendum)
Touched base with bedside RN that is starting the sepsis protocol to give her the run down of how elink can assist. Elink will continue to monitor for the whole 6 hrs of the protocol and offer assistance when needed.   Per labs and results review pt had blood cultures drawn at 0007 on 03/27/19

## 2019-03-28 LAB — CBC
HCT: 34.9 % — ABNORMAL LOW (ref 36.0–46.0)
Hemoglobin: 10.9 g/dL — ABNORMAL LOW (ref 12.0–15.0)
MCH: 22.8 pg — ABNORMAL LOW (ref 26.0–34.0)
MCHC: 31.2 g/dL (ref 30.0–36.0)
MCV: 73 fL — ABNORMAL LOW (ref 80.0–100.0)
Platelets: 271 10*3/uL (ref 150–400)
RBC: 4.78 MIL/uL (ref 3.87–5.11)
RDW: 16.7 % — ABNORMAL HIGH (ref 11.5–15.5)
WBC: 9.6 10*3/uL (ref 4.0–10.5)
nRBC: 0 % (ref 0.0–0.2)

## 2019-03-28 LAB — BASIC METABOLIC PANEL
Anion gap: 10 (ref 5–15)
BUN: <5 mg/dL — ABNORMAL LOW (ref 6–20)
CO2: 23 mmol/L (ref 22–32)
Calcium: 8.7 mg/dL — ABNORMAL LOW (ref 8.9–10.3)
Chloride: 103 mmol/L (ref 98–111)
Creatinine, Ser: 0.48 mg/dL (ref 0.44–1.00)
GFR calc Af Amer: >60 mL/min (ref >60)
GFR calc non Af Amer: >60 mL/min (ref >60)
Glucose, Bld: 84 mg/dL (ref 70–99)
Potassium: 3.9 mmol/L (ref 3.5–5.1)
Sodium: 136 mmol/L (ref 135–145)

## 2019-03-28 LAB — URINE CULTURE

## 2019-03-28 LAB — TYPE AND SCREEN
ABO/RH(D): A NEG
Antibody Screen: NEGATIVE

## 2019-03-28 MED ORDER — CHLORHEXIDINE GLUCONATE 4 % EX LIQD: Freq: Every day | CUTANEOUS | 2 refills | Status: DC | PRN

## 2019-03-28 MED ORDER — ACETAMINOPHEN 325 MG PO TABS: 650.0000 mg | ORAL_TABLET | ORAL | Status: DC | PRN

## 2019-03-28 NOTE — Discharge Instructions (Signed)
Wash affected areas with solution several times a day  Call provider for any increase in pain or fever  Take antibiotic until completely gone, even if you are feeling much better

## 2019-03-28 NOTE — Progress Notes (Signed)
PROGRESS NOTE    Morgan Collier  YTK:[id number] DOB: 03-21-2000 DOA: 03/26/2019 PCP: Patient, No Pcp Per  Brief Narrative: History of Present Illness: Morgan Collier is an 19 y.o. female asthma, pregnancy (G2P0 female at 61w1ddated by LMP of 10/08/2019, consistent with [place] 623w4d who is admitted by Ob/Gyn due to fever and hidradenitis. We are asked to consult.  Pt has been having fever, chill and painful induration in bilateral axillary and groin areas in the past two days. She had fever of 102 at home and multiple areas of red, hot, and tender indurations in her axilla and groin. She states that the right axillary pain is worst, which is constant, sharp, 10 out of 10 severity, nonradiating.  No active drainage. Pt was seen in ED yesterday and diagnosed as hidradenitis.  She was a started on started on Keflex and topical clindamycin, but no improvement. Plan is made to follow up with general surgery outpatient for possible I&D. She continues to have a fever, generalized malaise, chills.  She continues to have severe pain in bilateral axillary and groin areas. She also report that she feel decreased fetal movement, but per Ob/Gyn, Morgan Buttsthe fetal condition is fine. She denies contractions, cramping, LOF, or vaginal bleeding.  Patient does not have chest pain, cough, shortness breath.  No nausea vomiting, diarrhea, abdominal pain, symptoms of UTI or unilateral weakness.  3/1: Patient seen and examined.  No fevers noted over interval.  Improved pain in the right axilla.  Urine culture contaminated.  Blood culture no growth to date.  Patient feels well overall.  Surgery consult follow-up noted.  Not recommending any surgical management at this time.  Remains on broad-spectrum antibiotics.   Assessment & Plan:   Active Problems:   Hidradenitis suppurativa of right axilla   Supervision of normal pregnancy   Sepsis ([place]  Hypokalemia   Asthma  Sepsis due to hidradenitis suppurativa of  right axilla and groin areas:  Patient met criteria for sepsis with leukocytosis with WBC 16.3, fever, tachycardia.   Currently hemodynamically stable.   Plan/recommendations: Continue IV antibiotics while in-house -Per OB/GYN Morgan Buttsatient was previously prescribed Keflex and topical clindamycin in emergency department -This is an appropriate coverage to restart on discharge -Recommended the patient see a outside provider within 1 week of discharge for further evaluation and examination of the axilla.  If at that time there is evidence of worsening suppuration, can consider switching to oral clindamycin for improved MRSA coverage -Patient plans to discharge today.  No concerns from a medicine standpoint. -Recommendations communicated to Morgan Buttsprimary attending   Hypokalemia:  K= 3.4 on admission. - Repleted - Check Mg level  Asthma: stable -continue home prn albuerol  Supervision of normal pregnancy:  -Management per primary Ob/Gny team      Subjective: Patient seen and examined Significant other at bedside Patient feels well, without complaints   Objective: Vitals:   03/28/19 0400 03/28/19 0430 03/28/19 0500 03/28/19 0815  BP:    120/78  Pulse: 80 (!) 103  80  Resp:    20  Temp:   97.8 F (36.6 C) 97.6 F (36.4 C)  TempSrc:   Oral Oral  SpO2: 96% 93%  99%  Weight:      Height:        Intake/Output Summary (Last 24 hours) at 03/28/2019 1059 Last data filed at 03/28/2019 1014 Gross per 24 hour  Intake 2549.25 ml  Output 5400 ml  Net -2850.75 ml  Filed Weights   03/26/19 2240  Weight: 72.6 kg    Examination:  General exam: Appears calm and comfortable  Respiratory system: Clear to auscultation. Respiratory effort normal. Cardiovascular system: S1 & S2 heard, RRR. No JVD, murmurs, rubs, gallops or clicks. No pedal edema. Gastrointestinal system: Gravid Central nervous system: Alert and oriented. No focal neurological deficits. Extremities:  Mild tenderness to palpation right axilla.  No Purulent discharge noted Skin: No rashes, lesions or ulcers Psychiatry: Judgement and insight appear normal. Mood & affect appropriate.     Data Reviewed: I have personally reviewed following labs and imaging studies  CBC: Recent Labs  Lab 03/25/19 1609 03/26/19 2223 03/28/19 0511  WBC 16.7* 16.3* 9.6  NEUTROABS 13.7*  --   --   HGB 11.5* 10.2* 10.9*  HCT 36.0 31.9* 34.9*  MCV 71.4* 71.0* 73.0*  PLT 265 245 188   Basic Metabolic Panel: Recent Labs  Lab 03/25/19 1609 03/26/19 2223 03/28/19 0511  NA 132* 132* 136  K 3.6 3.4* 3.9  CL 102 104 103  CO2 23 18* 23  GLUCOSE 78 97 84  BUN <5* 5* <5*  CREATININE 0.55 0.59 0.48  CALCIUM 8.6* 8.4* 8.7*   GFR: Estimated Creatinine Clearance: 103.8 mL/min (by C-G formula based on SCr of 0.48 mg/dL). Liver Function Tests: Recent Labs  Lab 03/25/19 1609 03/26/19 2223  AST 14* 15  ALT 13 12  ALKPHOS 73 66  BILITOT 0.6 0.4  PROT 7.7 6.7  ALBUMIN 3.3* 2.8*   No results for input(s): LIPASE, AMYLASE in the last 168 hours. No results for input(s): AMMONIA in the last 168 hours. Coagulation Profile: Recent Labs  Lab 03/27/19 0935  INR 1.2   Cardiac Enzymes: No results for input(s): CKTOTAL, CKMB, CKMBINDEX, TROPONINI in the last 168 hours. BNP (last 3 results) No results for input(s): PROBNP in the last 8760 hours. HbA1C: No results for input(s): HGBA1C in the last 72 hours. CBG: No results for input(s): GLUCAP in the last 168 hours. Lipid Profile: No results for input(s): CHOL, HDL, LDLCALC, TRIG, CHOLHDL, LDLDIRECT in the last 72 hours. Thyroid Function Tests: No results for input(s): TSH, T4TOTAL, FREET4, T3FREE, THYROIDAB in the last 72 hours. Anemia Panel: No results for input(s): VITAMINB12, FOLATE, FERRITIN, TIBC, IRON, RETICCTPCT in the last 72 hours. Sepsis Labs: Recent Labs  Lab 03/27/19 0935 03/27/19 1126  PROCALCITON <0.10  --   LATICACIDVEN 0.7 1.0     Recent Results (from the past 240 hour(s))  SARS CORONAVIRUS 2 (TAT 6-24 HRS) Nasopharyngeal Nasopharyngeal Swab     Status: None   Collection Time: 03/25/19 10:28 PM   Specimen: Nasopharyngeal Swab  Result Value Ref Range Status   SARS Coronavirus 2 NEGATIVE NEGATIVE Final    Comment: (NOTE) SARS-CoV-2 target nucleic acids are NOT DETECTED. The SARS-CoV-2 RNA is generally detectable in upper and lower respiratory specimens during the acute phase of infection. Negative results do not preclude SARS-CoV-2 infection, do not rule out co-infections with other pathogens, and should not be used as the sole basis for treatment or other patient management decisions. Negative results must be combined with clinical observations, patient history, and epidemiological information. The expected result is Negative. Fact Sheet for Patients: SugarRoll.be Fact Sheet for Healthcare Providers: https://www.woods-mathews.com/ This test is not yet approved or cleared by the [place] FDA and  has been authorized for detection and/or diagnosis of SARS-CoV-2 by FDA under an Emergency Use Authorization (EUA). This EUA will remain  in effect (meaning this test  can be used) for the duration of the COVID-19 declaration under Section 56 4(b)(1) of the Act, 21 U.S.C. section 360bbb-3(b)(1), unless the authorization is terminated or revoked sooner. Performed at [facility], [place] [address], [place], [place] [address]   Urine culture     Status: Abnormal   Collection Time: 03/26/19 11:49 PM   Specimen: Urine, Random  Result Value Ref Range Status   Specimen Description   Final    URINE, RANDOM Performed at [facility], [address], [place], [place] [address]    Special Requests   Final    NONE Performed at [facility], [address] [place], [place] [address]    Culture (A)  Final    MULTIPLE SPECIES PRESENT, SUGGEST  RECOLLECTION NO GROUP B STREP (S.AGALACTIAE) ISOLATED Performed at [place] [address] [place], [place] [address]    Report Status 03/28/2019 FINAL  Final  Culture, blood (routine x 2)     Status: None (Preliminary result)   Collection Time: 03/27/19 12:07 AM   Specimen: BLOOD  Result Value Ref Range Status   Specimen Description BLOOD LEFT ANTECUBITAL  Final   Special Requests   Final    BOTTLES DRAWN AEROBIC AND ANAEROBIC Blood Culture adequate volume   Culture   Final    NO GROWTH 1 DAY Performed at [facility], [address] [place], [place] [address]    Report Status PENDING  Incomplete  Culture, blood (routine x 2)     Status: None (Preliminary result)   Collection Time: 03/27/19 12:07 AM   Specimen: BLOOD  Result Value Ref Range Status   Specimen Description BLOOD LEFT HAND  Final   Special Requests   Final    BOTTLES DRAWN AEROBIC AND ANAEROBIC Blood Culture adequate volume   Culture   Final    NO GROWTH 1 DAY Performed at [facility], [address] [place], [place] [address]    Report Status PENDING  Incomplete         Radiology Studies: No results found.      Scheduled Meds: . acetaminophen  650 mg Oral Q6H  . loratadine  10 mg Oral Daily  . sertraline  100 mg Oral Q2000   Continuous Infusions: . sodium chloride 100 mL/hr at 03/28/19 0259  . cefTRIAXone (ROCEPHIN)  IV 1 g (03/28/19 1014)  . lactated ringers 150 mL/hr at 03/27/19 0508  . vancomycin 750 mg (03/27/19 2135)     LOS: 1 day    Time spent: 20 minutes    Sidney Ace, MD [facility] Pager [phone]  If 7PM-7AM, please contact night-coverage 03/28/2019, 10:59 AM

## 2019-03-28 NOTE — Discharge Summary (Signed)
Antepartum Discharge Summary  Patient Name: Morgan Collier DOB: [birth year] MRN: [id number]  Date of Admission: 03/26/2019 Date of Discharge: 03/28/2019  Patient's last menstrual period was 10/08/2018 (exact date). Estimated Date of Delivery: 07/15/19  Prenatal care provided by [facility]  Pregnancy complicated by: 1. Rh Negative   Will need Rhogam at 28 weeks  2. Varicella Non Immune  Advise vaccination postpartum 3. Hx Gestational Diabetes  12/28/18 A1c [5.7 ] and early GTT [ 67 ]  Early 3hr GTT: NOT DONE 4. Closely space/d pregnancies  last delivery 07/2018 5. Hx Chlamydia 08/2018  Retest negative 10/2018 6. 12 week spotting  Rhogam given 01/04/2019 7. Asthma  Controlled with Flovent, prn albuterol inhaler 8. Bacterial Vaginosis in pregnancy   Hospital course:  Morgan Collier is a 19 y.o. G2P0 female at [redacted]w[redacted]d dated by LMP of 10/08/2019, consistent with [place] at [redacted]w[redacted]d.  She presented to L&D for fever of 102 at home and decreased fetal movement on 03/26/19. Right axillary pain 10/10  - She was seen in the office on 03/25/19 with complaints of lymphadenopathy.  She had a fever of 100.6 and multiple areas of red, hot, and tender indurations in her axilla and groin.  She was sent to ED for evaluation of fever, general malaise, and evaluation of nodules.  Probable diagnosis of hidradenitis given, started on Keflex and topical clindamycin and DC home on 2/26 with plan made to follow up with general surgery outpatient for possible I&D.   - Meeting criteria for sepsis on admission on 2/27, internal med and gen surgery were consulted.  - Pt was started on IV rocephin and vancomycin, IV fluid resuscitation and pain mgmt.  - Eval by Gen surgery, no need for I&D at this time due to marked improvement since admission.  - stable for DC home on PO Keflex, topical clindamycin; resume all previous pregnancy mgmt.    Discharge Physical Exam:  BP 117/74   Pulse 87   Temp 97.7 F (36.5 C) (Oral)    Resp 20   Ht 5\' 2"  (1.575 m)   Wt 72.6 kg   LMP 10/08/2018 (Exact Date)   SpO2 100% Comment: RA  BMI 29.26 kg/m   General: NAD CV: RRR Pulm: CTABL, nl effort ABD: s/nd/nt, gravid fundus NT Skin: healing nodules and induration in groin and left axilla, no erythema. Right axilla: mild erythema and induration noted, no drainage. Pt reports still tender but significant improvement.  DVT Evaluation: LE non-ttp, no evidence of DVT on exam. FHR 145bpm   Hemoglobin  Date Value Ref Range Status  03/28/2019 10.9 (L) 12.0 - 15.0 g/dL Final   HGB  Date Value Ref Range Status  11/30/2013 12.8 12.0 - 16.0 g/dL Final   HCT  Date Value Ref Range Status  03/28/2019 34.9 (L) 36.0 - 46.0 % Final  11/30/2013 41.2 35.0 - 47.0 % Final      Plan:  Morgan Collier was discharged to home in good condition. Follow-up appointment at [facility] in 1 weeks   Discharge Medications: Allergies as of 03/28/2019   No Known Allergies     Medication List    TAKE these medications   acetaminophen 325 MG tablet Commonly known as: TYLENOL Take 2 tablets (650 mg total) by mouth every 4 (four) hours as needed.   cephALEXin 500 MG capsule Commonly known as: KEFLEX Take 1 capsule (500 mg total) by mouth 4 (four) times daily for 10 days.   cetirizine 10 MG tablet Commonly known as:  ZYRTEC Take 10 mg by mouth daily as needed.   chlorhexidine 4 % external liquid Commonly known as: Hibiclens Apply topically daily as needed.   clindamycin 1 % gel Commonly known as: CLINDAGEL Apply to affected area 2 times daily   fluticasone 50 MCG/ACT nasal spray Commonly known as: FLONASE Place 1 spray into both nostrils daily as needed.   ProAir HFA 108 (90 Base) MCG/ACT inhaler Generic drug: albuterol Inhale 2 puffs into the lungs every 6 (six) hours as needed.   sertraline 100 MG tablet Commonly known as: ZOLOFT Take 100 mg by mouth daily.   valACYclovir 1000 MG tablet Commonly known as:  VALTREX Take 1,000 mg by mouth 2 (two) times daily. PRN for onset symptoms       Follow-up [facility] Follow up in 1 day(s).   Why: keep scheduled appt for 03/29/19,  and also make 1 week f/u  Contact information: [place] [phone]          Signed: Francetta Found, CNM 2:05 PM

## 2019-03-28 NOTE — Progress Notes (Signed)
DC to home.  Instructions reviewed for home use.  Pt verb u/o of completing her oral antibiotic and using the Chlorhexidine wash for affected areas.

## 2019-03-28 NOTE — Progress Notes (Signed)
[facility] SURGICAL PROGRESS NOTE (cpt [phone])  Hospital Day(s): 1.   Interval History: Patient seen and examined, no acute events or new complaints overnight. Patient reports that her pain in her right axilla is significantly better. No fever, chills, nausea, or emesis. She has improved ROM in her right upper extremity. Leukocytosis has now resolved. No new issus.   Review of Systems:  Constitutional: denies fever, chills  HEENT: denies cough or congestion  Respiratory: denies any shortness of breath  Cardiovascular: denies chest pain or palpitations  Gastrointestinal: denies abdominal pain, N/V, or diarrhea/and bowel function as per interval history Genitourinary: denies burning with urination or urinary frequency Musculoskeletal:+ Right axillary Pain (Signficiant improvement)   Vital signs in last 24 hours: [min-max] current  Temp:  [97.6 F (36.4 C)-97.9 F (36.6 C)] 97.8 F (36.6 C) (03/01 0500) Pulse Rate:  [71-103] 103 (03/01 0430) Resp:  [16-18] 18 (02/28 1700) BP: (100-144)/(55-104) 117/80 (03/01 0300) SpO2:  [74 %-100 %] 93 % (03/01 0430)     Height: 5\' 2"  (157.5 cm) Weight: 72.6 kg BMI (Calculated): 29.26   Intake/Output last 2 shifts:  02/28 0701 - 03/01 0700 In: 3343.1 [I.V.:3093.1; IV Piggyback:250] Out: 4700 [Urine:4700]   Physical Exam:  Constitutional: alert, cooperative and no distress  HENT: normocephalic without obvious abnormality  Eyes: PERRL, EOM's grossly intact and symmetric  Respiratory: breathing non-labored at rest  Cardiovascular: regular rate and sinus rhythm  Musculoskeletal: There are multiple areas of induration to the right axilla, no appreciate fluctuance or crepitus, tenderness significantly improved   Labs:  CBC Latest Ref Rng & Units 03/26/2019 03/25/2019 01/03/2019  WBC 4.0 - 10.5 K/uL 16.3(H) 16.7(H) 13.1(H)  Hemoglobin 12.0 - 15.0 g/dL 10.2(L) 11.5(L) 12.5  Hematocrit 36.0 - 46.0 % 31.9(L) 36.0 38.7  Platelets 150 -  400 K/uL 245 265 340   CMP Latest Ref Rng & Units 03/28/2019 03/26/2019 03/25/2019  Glucose 70 - 99 mg/dL 84 97 78  BUN 6 - 20 mg/dL 03/27/2019) 5(L) <1(B)  Creatinine 0.44 - 1.00 mg/dL <1(Y 7.82 9.56  Sodium 135 - 145 mmol/L 136 132(L) 132(L)  Potassium 3.5 - 5.1 mmol/L 3.9 3.4(L) 3.6  Chloride 98 - 111 mmol/L 103 104 102  CO2 22 - 32 mmol/L 23 18(L) 23  Calcium 8.9 - 10.3 mg/dL 2.13) 0.8(M) 5.7(Q)  Total Protein 6.5 - 8.1 g/dL - 6.7 7.7  Total Bilirubin 0.3 - 1.2 mg/dL - 0.4 0.6  Alkaline Phos 38 - 126 U/L - 66 73  AST 15 - 41 U/L - 15 14(L)  ALT 0 - 44 U/L - 12 13     Imaging studies: No new pertinent imaging studies   Assessment/Plan: (ICD-10's: L73.2) 19 y.o. female with resolved leukocytosis and significant improvement in pain attributable to right axillary hidradenitis , complicated by pertinent comorbidities including being [redacted] weeks pregnant.   - No indication for surgical intervention currently   - Continue IV Abx in house; transition to PO output   - Pain control prn    - Further management per primary service; no further general surgery issues  All of the above findings and recommendations were discussed with the patient, and the medical team, and all of patient's questions were answered to her expressed satisfaction.   -- 36, PA-C [facility] 03/28/2019, 7:12 AM [phone] M-F: 7am - 4pm

## 2019-04-01 LAB — CULTURE, BLOOD (ROUTINE X 2)
Culture: NO GROWTH
Culture: NO GROWTH
Special Requests: ADEQUATE
Special Requests: ADEQUATE

## 2019-05-04 ENCOUNTER — Ambulatory Visit: Payer: Medicaid Other | Admitting: Dietician

## 2019-05-10 ENCOUNTER — Observation Stay
Admission: EM | Admit: 2019-05-10 | Discharge: 2019-05-10 | Disposition: A | Discharge status: Home / Self Care | Payer: Medicaid Other | Attending: Obstetrics and Gynecology | Admitting: Obstetrics and Gynecology

## 2019-05-10 ENCOUNTER — Encounter: Payer: Self-pay | Admitting: *Deleted

## 2019-05-10 ENCOUNTER — Other Ambulatory Visit: Payer: Self-pay

## 2019-05-10 DIAGNOSIS — Z3A30 30 weeks gestation of pregnancy: Secondary | ICD-10-CM | POA: Diagnosis not present

## 2019-05-10 DIAGNOSIS — O26893 Other specified pregnancy related conditions, third trimester: Secondary | ICD-10-CM | POA: Diagnosis not present

## 2019-05-10 DIAGNOSIS — O24419 Gestational diabetes mellitus in pregnancy, unspecified control: Secondary | ICD-10-CM | POA: Insufficient documentation

## 2019-05-10 DIAGNOSIS — Z79899 Other long term (current) drug therapy: Secondary | ICD-10-CM | POA: Diagnosis not present

## 2019-05-10 DIAGNOSIS — O0993 Supervision of high risk pregnancy, unspecified, third trimester: Secondary | ICD-10-CM | POA: Diagnosis not present

## 2019-05-10 DIAGNOSIS — O4703 False labor before 37 completed weeks of gestation, third trimester: Secondary | ICD-10-CM | POA: Diagnosis present

## 2019-05-10 DIAGNOSIS — J45909 Unspecified asthma, uncomplicated: Secondary | ICD-10-CM | POA: Diagnosis not present

## 2019-05-10 LAB — WET PREP, GENITAL
Clue Cells Wet Prep HPF POC: NONE SEEN
Sperm: NONE SEEN
Trich, Wet Prep: NONE SEEN
Yeast Wet Prep HPF POC: NONE SEEN

## 2019-05-10 LAB — URINALYSIS, COMPLETE (UACMP) WITH MICROSCOPIC
Bilirubin Urine: NEGATIVE
Glucose, UA: >=500 mg/dL — AB
Hgb urine dipstick: NEGATIVE
Ketones, ur: 20 mg/dL — AB
Nitrite: NEGATIVE
Protein, ur: 30 mg/dL — AB
Specific Gravity, Urine: 1.023 (ref 1.005–1.030)
pH: 5 (ref 5.0–8.0)

## 2019-05-10 LAB — CHLAMYDIA/NGC RT PCR (ARMC ONLY)
Chlamydia Tr: NOT DETECTED
N gonorrhoeae: NOT DETECTED

## 2019-05-10 LAB — GLUCOSE, CAPILLARY: Glucose-Capillary: 142 mg/dL — ABNORMAL HIGH (ref 70–99)

## 2019-05-10 MED ORDER — ACETAMINOPHEN 325 MG PO TABS: 650.0000 mg | ORAL_TABLET | ORAL | Status: DC | PRN

## 2019-05-10 NOTE — OB Triage Note (Signed)
Recvd pt from ED. Pt states she started cramping every 10 - 20 min that started around 1500. She also states she has some pressure in her round ligament area. Pt has not had any water today and just drinks juice. No LOF or vaginal bleeding. No intercourse in the past 24 hours. Rates cramping a 7 out of 10. Pt has gestational diabetes and was just diagnosed. First appointment for that follow up is tomorrow.

## 2019-05-10 NOTE — Progress Notes (Signed)
Discharge instructions given to pt. Pt has no questions and verbalized understanding. Pt d/c home with S/O.

## 2019-05-10 NOTE — Discharge Summary (Signed)
Morgan Collier is a 19 y.o. female. She is at [redacted]w[redacted]d gestation. Patient's last menstrual period was 10/08/2018 (exact date). Estimated Date of Delivery: 07/15/19  Prenatal care site: [facility]   Current pregnancy complicated by:  1. Rh Negative   Will need Rhogam at 28 weeks  2. Varicella Non Immune  Advise vaccination postpartum 3. Hx Gestational Diabetes-->Gestational diabetes  Testing  12/28/18 A1c [5.7 ] and early GTT [ 63 ]  Early 3hr GTT: NOT DONE  04/27/2019 [redacted]w[redacted]d 1h OGTT 215  Teaching  [ ]  Lifestyles referal  Growth ultrasounds  [ ]  30w 4. Closely spaced pregnancies  last delivery 07/2018 5. Hx Chlamydia 08/2018  Retest negative 10/2018 6. 1st tri VB  Rhogam given 01/04/2019 7. Asthma  Controlled with Flovent, prn albuterol inhaler 8. Hidradenitis: admitted with presumed sepsis 03/26/19 9. 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 Bacterial Vaginosis in pregnancy   02/23/19 - episode of cramping, treated w/ flagyl  Chief complaint: cramping  Location: lower abdominal cramping and ligament pain Onset/timing: 3pm today Duration: comes and goes, occurs every 10-42min Quality: cramping.  Severity: 7/10 pain Aggravating or alleviating conditions: reports only drinking juice, no water at all today.  Associated signs/symptoms:  Increased vaginal discharge Context: recent dx GDM- has not been seen for diabetic teaching yet (scheduled tomorrow)  S: Resting comfortably. no CTX, no VB.no LOF,  Active fetal movement.  Denies: HA, visual changes, SOB, or RUQ/epigastric pain  Maternal Medical History:   Past Medical History:  Diagnosis Date  . Asthma    Seasonal  . First trimester screening   . Gestational diabetes   . Hidradenitis suppurativa of right axilla   . Labor and delivery indication for care or intervention 08/02/2018  . Labor and delivery, indication for care 08/02/2018  . Normal labor and delivery 08/02/2018    Past Surgical History:   Procedure Laterality Date  . INCISION AND DRAINAGE ABSCESS Right 10/10/2015   Procedure: Incision and drainage of complex axillary abscess Excisions of complex hidradenitis on right axilla                            3. Excisional debridement of skin subcutaneous tissue;  Surgeon: 10/03/2018, MD;  Location: [facility];  Service: General;  Laterality: Right;  . WISDOM TOOTH EXTRACTION  2016    No Known Allergies  Prior to Admission medications   Medication Sig Start Date End Date Taking? Authorizing Provider  Prenatal Vit-Fe Fumarate-FA (PRENATAL MULTIVITAMIN) TABS tablet Take 1 tablet by mouth daily at 12 noon.   Yes [provider]  sertraline (ZOLOFT) 100 MG tablet Take 100 mg by mouth daily.   Yes [provider]  acetaminophen (TYLENOL) 325 MG tablet Take 2 tablets (650 mg total) by mouth every 4 (four) hours as needed. 03/28/19   Claudie Brickhouse, 2017, CNM  cetirizine (ZYRTEC) 10 MG tablet Take 10 mg by mouth daily as needed.  04/07/18 04/07/19  [provider]  chlorhexidine (HIBICLENS) 4 % external liquid Apply topically daily as needed. 03/28/19   Shalicia Craghead, 06/07/19, CNM  clindamycin (CLINDAGEL) 1 % gel Apply to affected area 2 times daily 03/25/19 03/24/20  03/27/19., MD  fluticasone [facility]) 50 MCG/ACT nasal spray Place 1 spray into both nostrils daily as needed.  03/25/18   [provider]  PROAIR HFA 108 (90 Base) MCG/ACT inhaler Inhale 2 puffs into the lungs every 6 (six)  hours as needed. 10/26/15   [provider]  valACYclovir (VALTREX) 1000 MG tablet Take 1,000 mg by mouth 2 (two) times daily. PRN for onset symptoms    [provider]      Social History: She  reports that she has never smoked. She has never used smokeless tobacco. She reports previous alcohol use. She reports that she does not use drugs.  Family History: family history includes ADD / ADHD in her father; Diabetes in her paternal grandmother; Heart  disease in her maternal grandmother; Hypertension in her maternal grandmother and paternal grandmother; Throat cancer in her maternal uncle.   Review of Systems: A full review of systems was performed and negative except as noted in the HPI.     O:  BP (!) 133/50 (BP Location: Right Arm)   Pulse 100   Temp 97.9 F (36.6 C)   Resp 18   LMP 10/08/2018 (Exact Date)  Results for orders placed or performed during the hospital encounter of 05/10/19 (from the past 48 hour(s))  Glucose, capillary   Collection Time: 05/10/19  8:37 PM  Result Value Ref Range   Glucose-Capillary 142 (H) 70 - 99 mg/dL  Urinalysis, Complete w Microscopic   Collection Time: 05/10/19  8:45 PM  Result Value Ref Range   Color, Urine YELLOW (A) YELLOW   APPearance HAZY (A) CLEAR   Specific Gravity, Urine 1.023 1.005 - 1.030   pH 5.0 5.0 - 8.0   Glucose, UA >=500 (A) NEGATIVE mg/dL   Hgb urine dipstick NEGATIVE NEGATIVE   Bilirubin Urine NEGATIVE NEGATIVE   Ketones, ur 20 (A) NEGATIVE mg/dL   Protein, ur 30 (A) NEGATIVE mg/dL   Nitrite NEGATIVE NEGATIVE   Leukocytes,Ua SMALL (A) NEGATIVE   RBC / HPF 0-5 0 - 5 RBC/hpf   WBC, UA 0-5 0 - 5 WBC/hpf   Bacteria, UA RARE (A) NONE SEEN   Squamous Epithelial / LPF 0-5 0 - 5   Mucus PRESENT   Wet prep, genital   Collection Time: 05/10/19  9:22 PM  Result Value Ref Range   Yeast Wet Prep HPF POC NONE SEEN NONE SEEN   Trich, Wet Prep NONE SEEN NONE SEEN   Clue Cells Wet Prep HPF POC NONE SEEN NONE SEEN   WBC, Wet Prep HPF POC MANY (A) NONE SEEN   Sperm NONE SEEN      Constitutional: NAD, AAOx3  HE/ENT: extraocular movements grossly intact, moist mucous membranes CV: RRR PULM: nl respiratory effort, CTABL     Abd: gravid, non-tender, non-distended, soft, active fetal movement noted.      Ext: Non-tender, Nonedematous   Psych: mood appropriate, speech normal Pelvic: SSE: mod amount yellow thick discharge,  SVE long/closed.   Fetal  monitoring: Cat I Appropriate  for GA Baseline:  Variability: moderate Accelerations: present x >2 Decelerations absent Time  TOCO: Uterine irritability   A/P: 19 y.o. [redacted]w[redacted]d here for antenatal surveillance for abdominal pain- ligament pain in pregnancy.   Principle Diagnosis:  High risk pregnancy in third trimester- GDM, ligmanet pain.    Labor: not present.   Fetal Wellbeing: Reassuring Cat 1 tracing with Reactive NST   Poor hydration- improved pt discomfort after PO hydration.   GDM- poorly controlled, pt not following diet or checking glucose at home. Lifestyles appt scheduled on 05/11/19. Reiterated importance of well controlled glucose and poor obstetric outcomes with poor diabetic control.   A1C: pending  D/c home stable, precautions reviewed, follow-up as scheduled.  Francetta Found, CNM 05/10/2019  9:53 PM

## 2019-05-10 NOTE — Discharge Instructions (Signed)
Gestational Diabetes Mellitus, Diagnosis Gestational diabetes (gestational diabetes mellitus) is a short-term (temporary) form of diabetes that can happen during pregnancy. It goes away after you give birth. It may be caused by one or both of these problems:  Your pancreas does not make enough of a hormone called insulin.  Your body does not respond in a normal way to insulin that it makes. Insulin lets sugars (glucose) go into cells in the body. This gives you energy. If you have diabetes, sugars cannot get into cells. This causes high blood sugar (hyperglycemia). If you get gestational diabetes, you are:  More likely to get it if you get pregnant again.  More likely to develop type 2 diabetes in the future. If gestational diabetes is treated, it may not hurt you or your baby. Your doctor will set treatment goals for you. In general, you should have these blood sugar levels:  After not eating for a long time (fasting): 95 mg/dL (5.3 mmol/L).  After meals (postprandial): ? One hour after a meal: at or below 140 mg/dL (7.8 mmol/L). ? Two hours after a meal: at or below 120 mg/dL (6.7 mmol/L).  A1c (hemoglobin A1c) level: 6-6.5%. Follow these instructions at home: Questions to ask your doctor   You may want to ask these questions: ? Do I need to meet with a diabetes educator? ? What equipment will I need to care for myself at home? ? What medicines do I need? When should I take them? ? How often do I need to check my blood sugar? ? What number can I call if I have questions? ? When is my next doctor's visit? General instructions  Take over-the-counter and prescription medicines only as told by your doctor.  Stay at a healthy weight during pregnancy.  Keep all follow-up visits as told by your doctor. This is important. Contact a doctor if:  Your blood sugar is at or above 240 mg/dL (62.9 mmol/L).  Your blood sugar is at or above 200 mg/dL (52.8 mmol/L) and you have ketones in  your pee (urine).  You have been sick or have had a fever for 2 days or more and you are not getting better.  You have any of these problems for more than 6 hours: ? You cannot eat or drink. ? You feel sick to your stomach (nauseous). ? You throw up (vomit). ? You have watery poop (diarrhea). Get help right away if:  Your blood sugar is lower than 54 mg/dL (3 mmol/L).  You get confused.  You have trouble: ? Thinking clearly. ? Breathing.  Your baby moves less than normal.  You have any of these: ? Moderate or large ketone levels in your pee. ? Blood coming from your vagina. ? Unusual fluid coming from your vagina. ? Early contractions. These may feel like tightness in your belly. Summary  Gestational diabetes is a short-term form of diabetes. It can happen while you are pregnant. It goes away after you give birth.  If gestational diabetes is treated, it may not hurt you or your baby. Your doctor will set treatment goals for you.  Keep all follow-up visits as told by your doctor. This is important. This information is not intended to replace advice given to you by your health care provider. Make sure you discuss any questions you have with your health care provider. Document Revised: 02/19/2017 Document Reviewed: 02/16/2015 Elsevier Patient Education  2020 Elsevier Inc. Diabetes Mellitus and Nutrition, Adult When you have diabetes (diabetes mellitus),  it is very important to have healthy eating habits because your blood sugar (glucose) levels are greatly affected by what you eat and drink. Eating healthy foods in the appropriate amounts, at about the same times every day, can help you:  Control your blood glucose.  Lower your risk of heart disease.  Improve your blood pressure.  Reach or maintain a healthy weight. Every person with diabetes is different, and each person has different needs for a meal plan. Your health care provider may recommend that you work with a diet  and nutrition specialist (dietitian) to make a meal plan that is best for you. Your meal plan may vary depending on factors such as:  The calories you need.  The medicines you take.  Your weight.  Your blood glucose, blood pressure, and cholesterol levels.  Your activity level.  Other health conditions you have, such as heart or kidney disease. How do carbohydrates affect me? Carbohydrates, also called carbs, affect your blood glucose level more than any other type of food. Eating carbs naturally raises the amount of glucose in your blood. Carb counting is a method for keeping track of how many carbs you eat. Counting carbs is important to keep your blood glucose at a healthy level, especially if you use insulin or take certain oral diabetes medicines. It is important to know how many carbs you can safely have in each meal. This is different for every person. Your dietitian can help you calculate how many carbs you should have at each meal and for each snack. Foods that contain carbs include:  Bread, cereal, rice, pasta, and crackers.  Potatoes and corn.  Peas, beans, and lentils.  Milk and yogurt.  Fruit and juice.  Desserts, such as cakes, cookies, ice cream, and candy. How does alcohol affect me? Alcohol can cause a sudden decrease in blood glucose (hypoglycemia), especially if you use insulin or take certain oral diabetes medicines. Hypoglycemia can be a life-threatening condition. Symptoms of hypoglycemia (sleepiness, dizziness, and confusion) are similar to symptoms of having too much alcohol. If your health care provider says that alcohol is safe for you, follow these guidelines:  Limit alcohol intake to no more than 1 drink per day for nonpregnant women and 2 drinks per day for men. One drink equals 12 oz of beer, 5 oz of wine, or 1 oz of hard liquor.  Do not drink on an empty stomach.  Keep yourself hydrated with water, diet soda, or unsweetened iced tea.  Keep in  mind that regular soda, juice, and other mixers may contain a lot of sugar and must be counted as carbs. What are tips for following this plan?  Reading food labels  Start by checking the serving size on the "Nutrition Facts" label of packaged foods and drinks. The amount of calories, carbs, fats, and other nutrients listed on the label is based on one serving of the item. Many items contain more than one serving per package.  Check the total grams (g) of carbs in one serving. You can calculate the number of servings of carbs in one serving by dividing the total carbs by 15. For example, if a food has 30 g of total carbs, it would be equal to 2 servings of carbs.  Check the number of grams (g) of saturated and trans fats in one serving. Choose foods that have low or no amount of these fats.  Check the number of milligrams (mg) of salt (sodium) in one serving. Most people  should limit total sodium intake to less than 2,300 mg per day.  Always check the nutrition information of foods labeled as "low-fat" or "nonfat". These foods may be higher in added sugar or refined carbs and should be avoided.  Talk to your dietitian to identify your daily goals for nutrients listed on the label. Shopping  Avoid buying canned, premade, or processed foods. These foods tend to be high in fat, sodium, and added sugar.  Shop around the outside edge of the grocery store. This includes fresh fruits and vegetables, bulk grains, fresh meats, and fresh dairy. Cooking  Use low-heat cooking methods, such as baking, instead of high-heat cooking methods like deep frying.  Cook using healthy oils, such as olive, canola, or sunflower oil.  Avoid cooking with butter, cream, or high-fat meats. Meal planning  Eat meals and snacks regularly, preferably at the same times every day. Avoid going long periods of time without eating.  Eat foods high in fiber, such as fresh fruits, vegetables, beans, and whole grains. Talk  to your dietitian about how many servings of carbs you can eat at each meal.  Eat 4-6 ounces (oz) of lean protein each day, such as lean meat, chicken, fish, eggs, or tofu. One oz of lean protein is equal to: ? 1 oz of meat, chicken, or fish. ? 1 egg. ?  cup of tofu.  Eat some foods each day that contain healthy fats, such as avocado, nuts, seeds, and fish. Lifestyle  Check your blood glucose regularly.  Exercise regularly as told by your health care provider. This may include: ? 150 minutes of moderate-intensity or vigorous-intensity exercise each week. This could be brisk walking, biking, or water aerobics. ? Stretching and doing strength exercises, such as yoga or weightlifting, at least 2 times a week.  Take medicines as told by your health care provider.  Do not use any products that contain nicotine or tobacco, such as cigarettes and e-cigarettes. If you need help quitting, ask your health care provider.  Work with a Veterinary surgeon or diabetes educator to identify strategies to manage stress and any emotional and social challenges. Questions to ask a health care provider  Do I need to meet with a diabetes educator?  Do I need to meet with a dietitian?  What number can I call if I have questions?  When are the best times to check my blood glucose? Where to find more information:  American Diabetes Association: diabetes.org  Academy of Nutrition and Dietetics: www.eatright.[employer] of Diabetes and Digestive and Kidney Diseases (NIH): CarFlippers.tn Summary  A healthy meal plan will help you control your blood glucose and maintain a healthy lifestyle.  Working with a diet and nutrition specialist (dietitian) can help you make a meal plan that is best for you.  Keep in mind that carbohydrates (carbs) and alcohol have immediate effects on your blood glucose levels. It is important to count carbs and to use alcohol carefully. This information is not intended  to replace advice given to you by your health care provider. Make sure you discuss any questions you have with your health care provider. Document Revised: 12/26/2016 Document Reviewed: 02/18/2016 Elsevier Patient Education  2020 [employer].

## 2019-05-11 ENCOUNTER — Encounter: Payer: Self-pay | Admitting: Skilled Nursing Facility1

## 2019-05-11 ENCOUNTER — Encounter: Payer: Medicaid Other | Attending: Obstetrics and Gynecology | Admitting: Skilled Nursing Facility1

## 2019-05-11 DIAGNOSIS — Z3A00 Weeks of gestation of pregnancy not specified: Secondary | ICD-10-CM | POA: Diagnosis not present

## 2019-05-11 DIAGNOSIS — O24410 Gestational diabetes mellitus in pregnancy, diet controlled: Secondary | ICD-10-CM | POA: Insufficient documentation

## 2019-05-11 DIAGNOSIS — O24419 Gestational diabetes mellitus in pregnancy, unspecified control: Secondary | ICD-10-CM

## 2019-05-11 NOTE — Progress Notes (Signed)
Pt states her zoloft did have to be increased due to anxiety. Pt states she does have depression and had post partum depression after her first pregnancy: Dietitian discussed signs for her boyfriend to look out for. Pt states she is [redacted] weeks along.  Pt states she sometimes does not eat and feels shaky.  Pt states she eats out about 85% of her meals.  Pt states she really has not been paying attention to her diet and just eating what she wants.  Pt checked her blood sugar (fasting): 100  Education topics: Hypoglycemia: symptoms and how to correct it Balanced meals and how to create them The importance of eating throughout the day Self monitoring and gave Accu check guide Me exp: 2020-02-16   [id number]  Handouts: glucose log Detailed myplate  Goals: Look for a talk therapist that takes your insurance or does a sliding scale payment Eat from home more often than eating out Eat within 1-1.5 hours of waking; do not skip breakfast  Create balanced meals  Creat a fancy fruit salad to keep in the fridge add greek yogurt to it for extra balance  Try lemon and fruit in your water to give it some flavor Check 4 times a day: fasting: under 95 (before you eating anything in the morning-if you ate in the night write that down) and 2 hours after each meal (under 120) Under 60 is too low, fix it

## 2019-05-12 LAB — HEMOGLOBIN A1C
Hgb A1c MFr Bld: 5.8 % — ABNORMAL HIGH (ref 4.8–5.6)
Mean Plasma Glucose: 120 mg/dL

## 2019-05-17 IMAGING — US US MFM OB COMPLETE +14 WKS
1 series · 13 of 28 positions shown · non-contrast
Comparison: none

PATIENT INFO:

PERFORMED BY:
                   Sonographer                              TOMO
SERVICE(S) PROVIDED:
  US MFM OB COMP LESS THAN 14 WEEKS                    76801.4
 ----------------------------------------------------------------------
INDICATIONS:
  13 weeks gestation of pregnancy
FETAL EVALUATION:
 Num Of Fetuses:         1
 Gest. Sac:              Normal
 Fetal Pole:             Visualized
 Fetal Heart Rate(bpm):  165
 Cardiac Activity:       Present
 Presentation:           Variable
 Placenta:               Fundal
BIOMETRY:
 CRL:      69.7  mm     G. Age:  13w 1d                  EDD:   08/15/18
OB HISTORY:
 Gravidity:    1
GESTATIONAL AGE:
 LMP:           13w 0d        Date:  11/09/17                 EDD:   08/16/18
 Best:          13w 0d     Det. By:  Early Ultrasound         EDD:   08/16/18
                                     (12/29/17)
1ST TRIMESTER GENETIC SONOGRAM SCREENING:
 CRL:            69.7  mm    G. Age:   13w 1d                 EDD:   08/15/18
ANATOMY:
 Cranium:               Limited Views          Spine:                  Too early to
                                                                       evaluate
 Stomach:               Seen                   Upper Extremities:      Visualized
 Bladder:               Seen                   Lower Extremities:      Visualized
CERVIX UTERUS ADNEXA:
 Adnexa
 WNL

[Series 1: us mfm ob complete +14 wks · 13 of 45 slices shown]
[im 2/45]
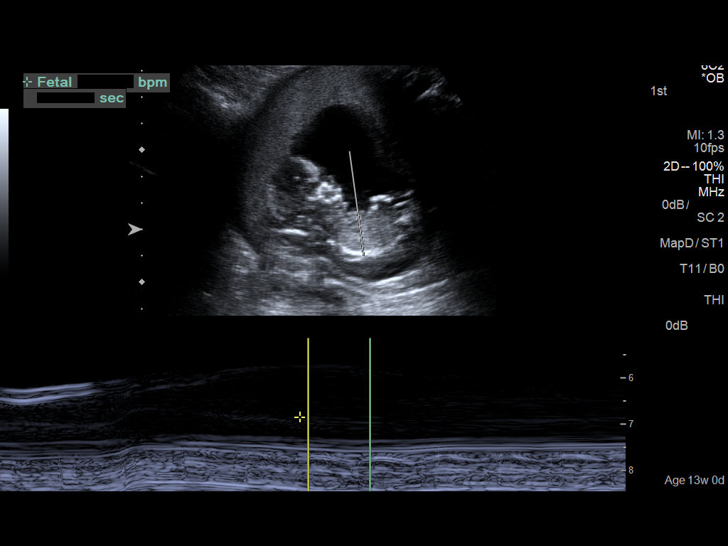
[im 5/45]
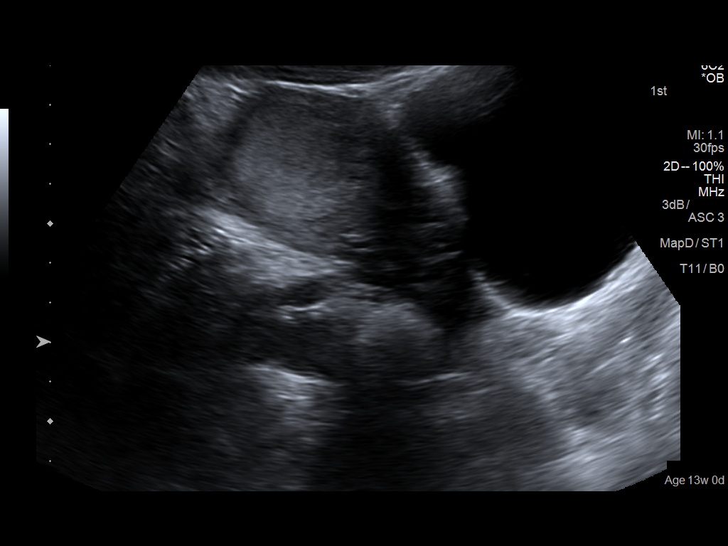
[im 9/45]
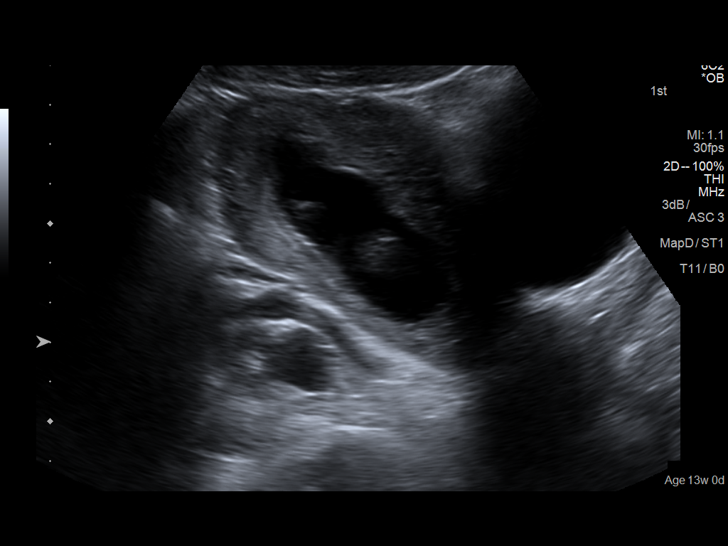
[im 12/45]
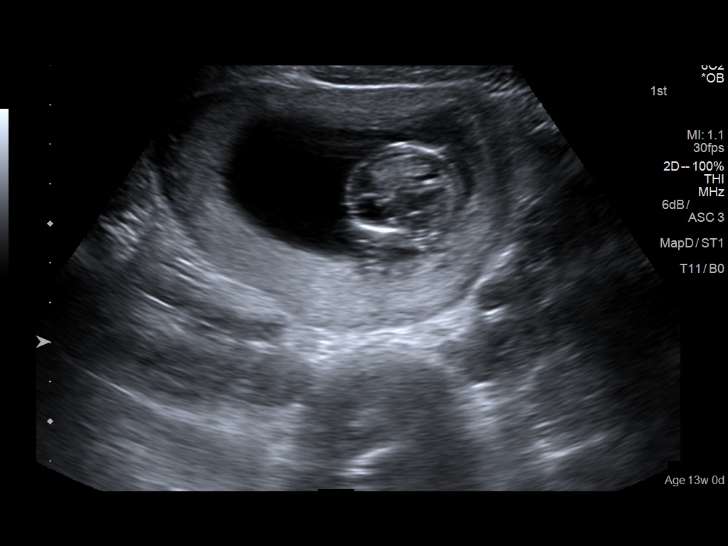
[im 15/45]
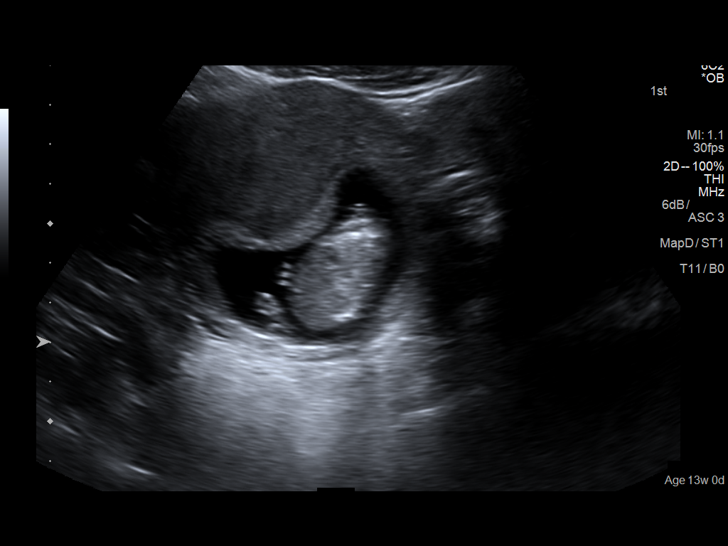
[im 18/45]
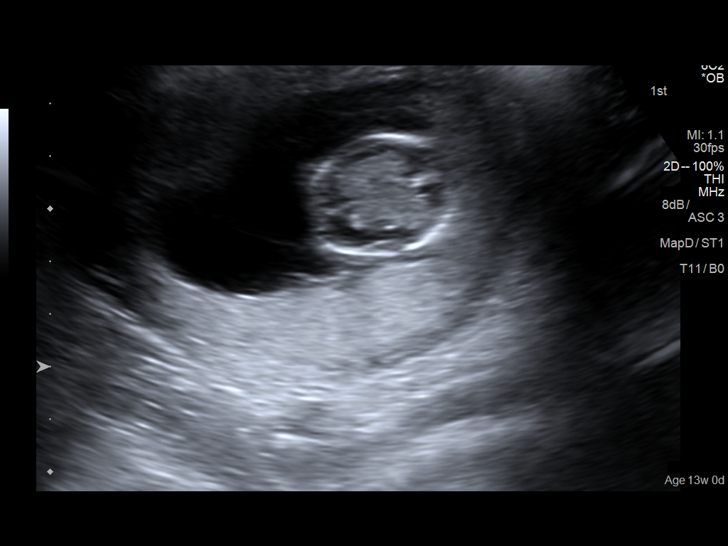
[im 23/45]
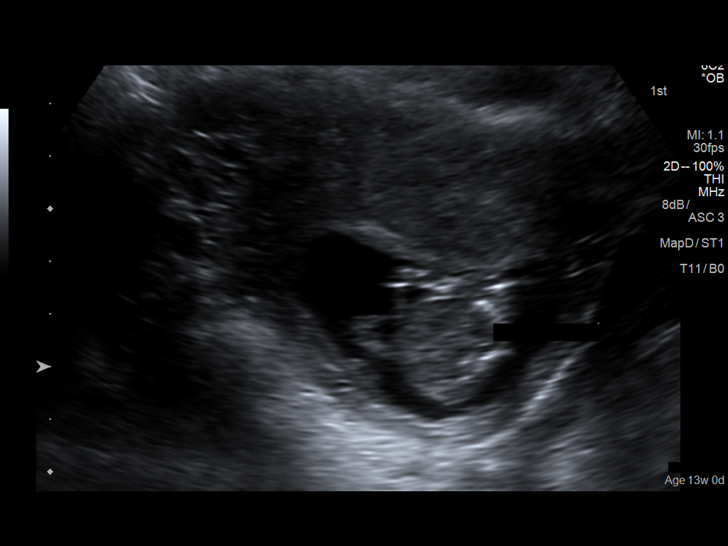
[im 27/45]
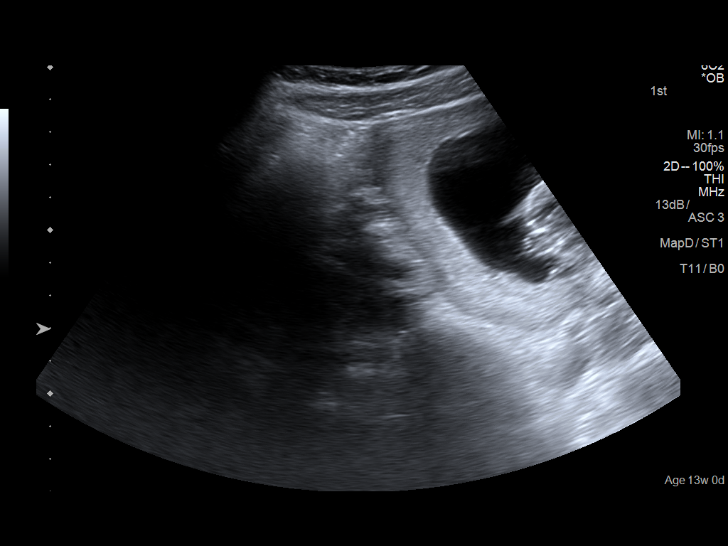
[im 30/45]
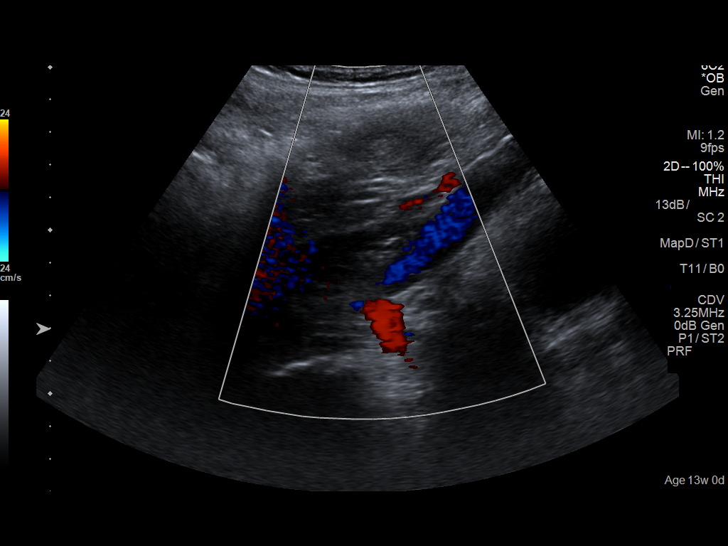
[im 33/45]
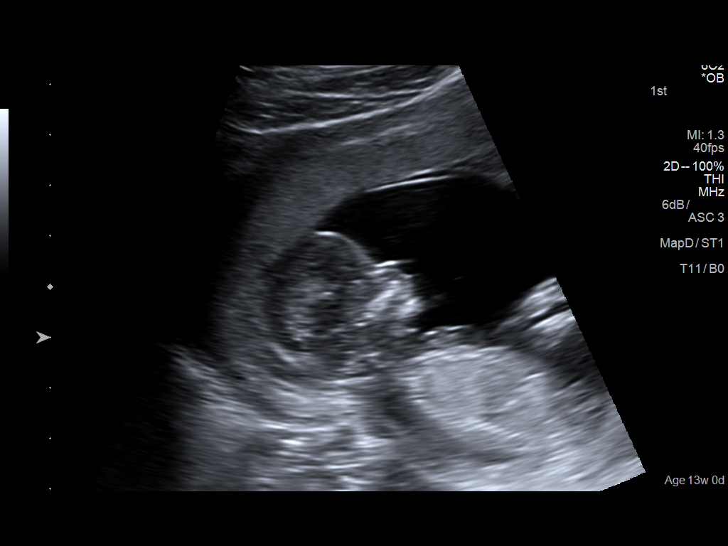
[im 36/45]
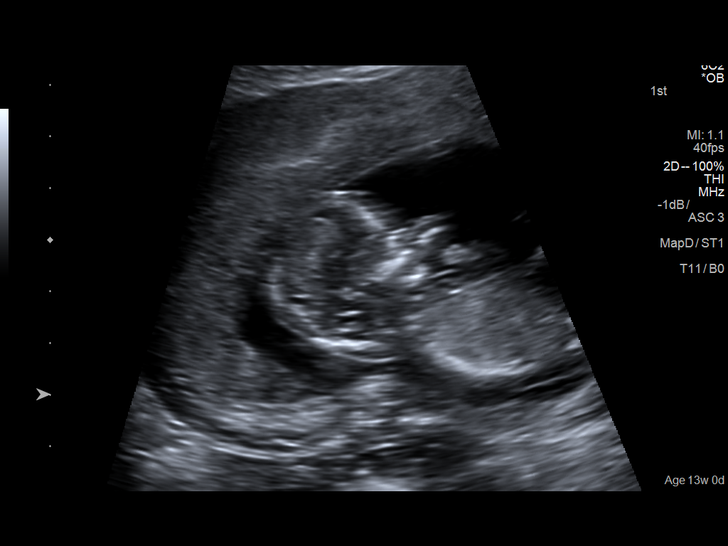
[im 40/45]
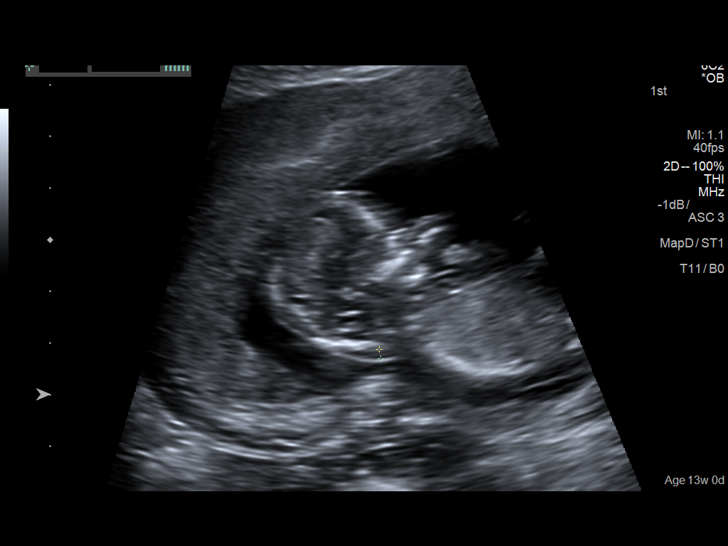
[im 43/45]
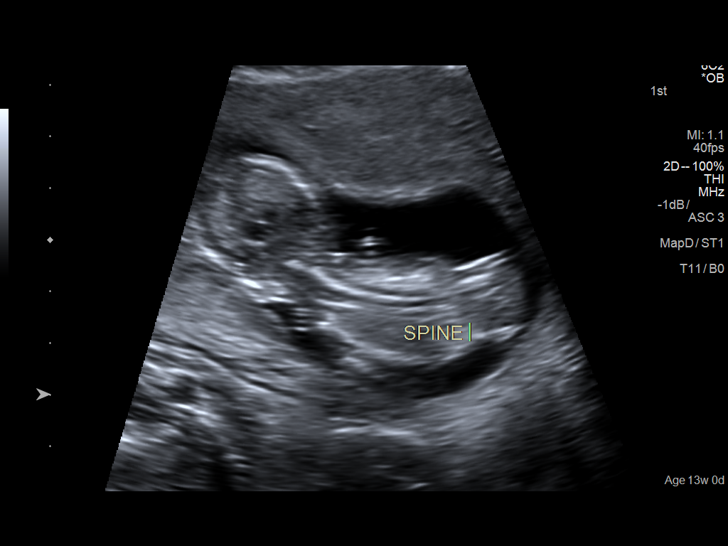

[13 of 28 positions shown; findings below may reference images not displayed]

IMPRESSION: Dear Ms. TOMO,

 Thank you for referring your patient  for first trimester nuchal
 translucency screening .

 A singleton gestation is noted.
 Pt is dated by early scan on 12/29/17at 7w 1d at [REDACTED] giving a best edc of 08/16/2018
 The fetal biometry correlates with established dating.

 The nuchal translucency measured   1.8 mm.

 The ovaries were WNL.

 The patient was scheduled to have blood drawn for first
 trimester screening.

 A composite risk incorporating her age, nuchal translucency
 measurement and blood results should be returned to your
 office shortly ([facility]).

 Thank you for allowing us to participate in your patient's care.

 assistance.

 Consider anatomy scan at 17-19 weeks

## 2019-05-24 ENCOUNTER — Encounter: Payer: Self-pay | Admitting: Obstetrics & Gynecology

## 2019-05-24 ENCOUNTER — Observation Stay
Admission: EM | Admit: 2019-05-24 | Discharge: 2019-05-24 | Disposition: A | Discharge status: Home / Self Care | Payer: Medicaid Other | Attending: Obstetrics & Gynecology | Admitting: Obstetrics & Gynecology

## 2019-05-24 ENCOUNTER — Other Ambulatory Visit: Payer: Self-pay

## 2019-05-24 DIAGNOSIS — Z3A32 32 weeks gestation of pregnancy: Secondary | ICD-10-CM | POA: Diagnosis not present

## 2019-05-24 DIAGNOSIS — J45909 Unspecified asthma, uncomplicated: Secondary | ICD-10-CM | POA: Insufficient documentation

## 2019-05-24 DIAGNOSIS — Z79899 Other long term (current) drug therapy: Secondary | ICD-10-CM | POA: Insufficient documentation

## 2019-05-24 DIAGNOSIS — O368190 Decreased fetal movements, unspecified trimester, not applicable or unspecified: Secondary | ICD-10-CM

## 2019-05-24 DIAGNOSIS — O479 False labor, unspecified: Secondary | ICD-10-CM | POA: Diagnosis present

## 2019-05-24 DIAGNOSIS — O24414 Gestational diabetes mellitus in pregnancy, insulin controlled: Secondary | ICD-10-CM | POA: Insufficient documentation

## 2019-05-24 DIAGNOSIS — Z20822 Contact with and (suspected) exposure to covid-19: Secondary | ICD-10-CM | POA: Diagnosis not present

## 2019-05-24 DIAGNOSIS — Z7951 Long term (current) use of inhaled steroids: Secondary | ICD-10-CM | POA: Insufficient documentation

## 2019-05-24 DIAGNOSIS — O0993 Supervision of high risk pregnancy, unspecified, third trimester: Secondary | ICD-10-CM | POA: Diagnosis not present

## 2019-05-24 DIAGNOSIS — O99513 Diseases of the respiratory system complicating pregnancy, third trimester: Secondary | ICD-10-CM | POA: Insufficient documentation

## 2019-05-24 LAB — WET PREP, GENITAL
Clue Cells Wet Prep HPF POC: NONE SEEN
Sperm: NONE SEEN
Trich, Wet Prep: NONE SEEN
Yeast Wet Prep HPF POC: NONE SEEN

## 2019-05-24 LAB — URINALYSIS, COMPLETE (UACMP) WITH MICROSCOPIC
Bacteria, UA: NONE SEEN
Bilirubin Urine: NEGATIVE
Glucose, UA: NEGATIVE mg/dL
Hgb urine dipstick: NEGATIVE
Ketones, ur: NEGATIVE mg/dL
Leukocytes,Ua: NEGATIVE
Nitrite: NEGATIVE
Protein, ur: NEGATIVE mg/dL
Specific Gravity, Urine: 1.008 (ref 1.005–1.030)
pH: 6 (ref 5.0–8.0)

## 2019-05-24 LAB — TYPE AND SCREEN
ABO/RH(D): A NEG
Antibody Screen: POSITIVE
Unit division: 0
Unit division: 0

## 2019-05-24 LAB — CBC WITH DIFFERENTIAL/PLATELET
Abs Immature Granulocytes: 0.06 10*3/uL (ref 0.00–0.07)
Basophils Absolute: 0 10*3/uL (ref 0.0–0.1)
Basophils Relative: 0 %
Eosinophils Absolute: 0.1 10*3/uL (ref 0.0–0.5)
Eosinophils Relative: 1 %
HCT: 34.9 % — ABNORMAL LOW (ref 36.0–46.0)
Hemoglobin: 10.8 g/dL — ABNORMAL LOW (ref 12.0–15.0)
Immature Granulocytes: 1 %
Lymphocytes Relative: 22 %
Lymphs Abs: 2.1 10*3/uL (ref 0.7–4.0)
MCH: 22.1 pg — ABNORMAL LOW (ref 26.0–34.0)
MCHC: 30.9 g/dL (ref 30.0–36.0)
MCV: 71.4 fL — ABNORMAL LOW (ref 80.0–100.0)
Monocytes Absolute: 0.7 10*3/uL (ref 0.1–1.0)
Monocytes Relative: 7 %
Neutro Abs: 6.8 10*3/uL (ref 1.7–7.7)
Neutrophils Relative %: 69 %
Platelets: 262 10*3/uL (ref 150–400)
RBC: 4.89 MIL/uL (ref 3.87–5.11)
RDW: 15.3 % (ref 11.5–15.5)
WBC: 9.7 10*3/uL (ref 4.0–10.5)
nRBC: 0 % (ref 0.0–0.2)

## 2019-05-24 LAB — GLUCOSE, CAPILLARY
Glucose-Capillary: 106 mg/dL — ABNORMAL HIGH (ref 70–99)
Glucose-Capillary: 174 mg/dL — ABNORMAL HIGH (ref 70–99)

## 2019-05-24 LAB — RESPIRATORY PANEL BY RT PCR (FLU A&B, COVID)
Influenza A by PCR: NEGATIVE
Influenza B by PCR: NEGATIVE
SARS Coronavirus 2 by RT PCR: NEGATIVE

## 2019-05-24 LAB — CHLAMYDIA/NGC RT PCR (ARMC ONLY)
Chlamydia Tr: NOT DETECTED
N gonorrhoeae: NOT DETECTED

## 2019-05-24 LAB — BPAM RBC
Blood Product Expiration Date: 202105252359
Blood Product Expiration Date: 202105252359
Unit Type and Rh: 600
Unit Type and Rh: 600

## 2019-05-24 LAB — GROUP B STREP BY PCR: Group B strep by PCR: NEGATIVE

## 2019-05-24 LAB — FETAL FIBRONECTIN: Fetal Fibronectin: NEGATIVE

## 2019-05-24 MED ORDER — TERBUTALINE SULFATE 1 MG/ML IJ SOLN: 0.2500 mg | Freq: Once | INTRAMUSCULAR | Status: AC

## 2019-05-24 MED ORDER — LACTATED RINGERS IV SOLN: INTRAVENOUS | Status: DC

## 2019-05-24 MED ORDER — BETAMETHASONE SOD PHOS & ACET 6 (3-3) MG/ML IJ SUSP
12.0000 mg | INTRAMUSCULAR | Status: DC
  Administered 2019-05-24: 10:00:00 12 mg via INTRAMUSCULAR
  Filled 2019-05-24: qty 5

## 2019-05-24 MED ORDER — ACETAMINOPHEN 325 MG PO TABS
650.0000 mg | ORAL_TABLET | ORAL | Status: DC | PRN
  Administered 2019-05-24: 650 mg via ORAL
  Filled 2019-05-24: qty 2

## 2019-05-24 MED ORDER — TERBUTALINE SULFATE 1 MG/ML IJ SOLN
INTRAMUSCULAR | Status: AC
  Administered 2019-05-24: 0.25 mg via SUBCUTANEOUS
  Filled 2019-05-24: qty 1

## 2019-05-24 MED ORDER — CALCIUM CARBONATE ANTACID 500 MG PO CHEW
2.0000 | CHEWABLE_TABLET | ORAL | Status: DC | PRN
  Administered 2019-05-24: 08:00:00 400 mg via ORAL
  Filled 2019-05-24: qty 2

## 2019-05-24 MED ORDER — PRENATAL MULTIVITAMIN CH: 1.0000 | ORAL_TABLET | Freq: Every day | ORAL | Status: DC

## 2019-05-24 NOTE — Progress Notes (Signed)
Inpatient Diabetes Program Recommendations  Diabetes Treatment Program Recommendations  ADA Standards of Care 2020 Diabetes in Pregnancy Target Glucose Ranges:  Fasting: 60 - 90 mg/dL Preprandial: 60 - 267 mg/dL 1 hr postprandial: Less than 140mg /dL (from first bite of meal) 2 hr postprandial: Less than 120 mg/dL (from first bite of meal)   Results for DELORIS, MOGER (MRN Orma Render) as of 05/24/2019 13:18  Ref. Range 05/24/2019 08:44 05/24/2019 11:50  Glucose-Capillary Latest Ref Range: 70 - 99 mg/dL 05/26/2019 (H) 338 (H)  Results for MARYUM, BATTERSON (MRN Orma Render) as of 05/24/2019 13:18  Ref. Range 05/10/2019 21:09  Hemoglobin A1C Latest Ref Range: 4.8 - 5.6 % 5.8 (H)   Review of Glycemic Control  Diabetes history: GDM Outpatient Diabetes medications: None Current orders for Inpatient glycemic control: None  NOTE: Received page from [place], [place] regarding talking with observation patient about using insulin. Patient is [name] gestation and received Betamethason 12 mg today at 9:55 am.  Per [place], CNM patient will be discharged home on Regular and NPH insulin.  Patient reports that she had GDM during her prior pregnancy and she states she has never taken insulin. Spoke with patient about gestational diabetes and insulin. Provided patient with booklet on Managing Gestational Diabetes.  Discussed basic pathophysiology of GDM and reviewed glucose goals (fasting and 2 hour post prandial).  Discussed potential complications for patient and her baby if DM is not well controlled during pregnancy. Reviewed signs and symptoms of hyperglycemia and hypoglycemia along with treatment for both.  Discussed NPH and Regular insulin in detail (how they work, onset, and duration).  Explained and demonstrated proper technique on how to draw up and mix insulins with vial and syringe. Showed patient how to draw up air, inject air into vial, draw up insulin, and inject. Patient was able to successfully demonstrate how  to use vial and syringe for insulin injections and how to mix insulin into same syringe.Also reviewed and demonstrated how to use insulin pen. Patient was able to successfully demonstrate how to use an insulin pen. Patient states that she would prefer to use insulin pens for insulin injections at home.  Asked patient to check her glucose as MD directs and to keep a log book of glucose readings and insulin taken. Explained how the doctor she follows up with can use the log book to continue to make insulin adjustments if needed. Explained that insulin adjustments may need to be made weekly as the pregnancy progresses. Patient states that she would already has a glucometer and testing supplies. Encouraged patient to stay in close contact with providers and to reach out to her providers if glucose is consistently above targets (fasting <90 mg/dl and 2 hour post prandial <120 mg/dl). Also encouraged patient to reach out to provider if she experiences any issues with hypoglycemia so insulin can be adjusted if needed. Reminded patient that she received Betamethasone which is a steroid and is likely to cause glucose to be more elevated over the next day or two.  Informed patient that discharge paperwork will have exact dosage of NPH and Regular insulin to take and when to take it.  Patient verbalized understanding of information and she appears to be eager to learn and keep glucose well controlled.   Thanks, Lurena Joiner, RN, MSN, CDE Diabetes Coordinator Inpatient Diabetes Program [phone] (Team Pager from 8am to 5pm)

## 2019-05-24 NOTE — Progress Notes (Signed)
RN at bedside for last 25 minutes adjusting EFM and no ctx palpated. Pt asked to change position to semi-fowlers in order to assess ctx pattern more effectively. [place] and [place] adjusted.

## 2019-05-24 NOTE — Discharge Summary (Signed)
Morgan Collier is a 19 y.o. female. She is at [redacted]w[redacted]d gestation. Patient's last menstrual period was 10/08/2018 (exact date). Estimated Date of Delivery: 07/15/19  Prenatal care site: [facility]  Current pregnancy complicated by:  1. Rh Negative- received rhogam 04/27/19  2. Varicella Non Immune 3. Gestational diabetes- seen by [facility] on 05/11/19  04/27/2019 [redacted]w[redacted]d 1h OGTT 215 4. Closely spaced pregnancies  last delivery 07/2018 5. Hx Chlamydia 08/2018  Retest negative 10/2018 6. 1st tri VB  Rhogam given 01/04/2019 7. Asthma  Controlled with Flovent, prn albuterol inhaler 8. Hidradenitis: admitted with presumed sepsis 03/26/19 9. Hx HSV 10. Bacterial Vaginosis in pregnancy   02/23/19 - episode of cramping, treated w/ flagyl   Chief complaint: painful UCs since last night at 2000  Location: low abdominal/pelvic pain Onset/timing: 4/26 at 8pm Duration: comes and goes, about 5-29min apart Quality: painful, tight Severity: 7-8/10 initially on admission.  Aggravating or alleviating conditions: denies vaginal discharge, LOF or Bleeding.  Associated signs/symptoms: decreased fetal movement noted last evening, better when she came in today.  Context: GDM dx 1 month ago, reports all fasting values are in low 100s; and at least half of mealtime values are over 120.   S: Resting comfortably. no CTX since terb given, no VB.no LOF,  Active fetal movement. Denies: HA, visual changes, SOB, or RUQ/epigastric pain  Maternal Medical History:   Past Medical History:  Diagnosis Date  . Asthma    Seasonal  . First trimester screening   . Gestational diabetes   . Hidradenitis suppurativa of right axilla   . Labor and delivery indication for care or intervention 08/02/2018  . Labor and delivery, indication for care 08/02/2018  . Normal labor and delivery 08/02/2018    Past Surgical History:  Procedure Laterality Date  . INCISION AND DRAINAGE ABSCESS Right 10/10/2015   Procedure:  Incision and drainage of complex axillary abscess Excisions of complex hidradenitis on right axilla                            3. Excisional debridement of skin subcutaneous tissue;  Surgeon: Jules Husbands, MD;  Location: [place];  Service: General;  Laterality: Right;  . WISDOM TOOTH EXTRACTION  2016    No Known Allergies   Prior to Admission medications   Medication Sig Start Date End Date Taking? Authorizing Provider  chlorhexidine (HIBICLENS) 4 % external liquid Apply topically daily as needed. 03/28/19  Yes Delilah Mulgrew, Murray Hodgkins, CNM  fluticasone (FLONASE) 50 MCG/ACT nasal spray Place 1 spray into both nostrils daily as needed.  03/25/18  Yes [provider]  Prenatal Vit-Fe Fumarate-FA (PRENATAL MULTIVITAMIN) TABS tablet Take 1 tablet by mouth daily at 12 noon.   Yes [provider]  PROAIR HFA 108 (90 Base) MCG/ACT inhaler Inhale 2 puffs into the lungs every 6 (six) hours as needed. 10/26/15  Yes [provider]  sertraline (ZOLOFT) 100 MG tablet Take 100 mg by mouth daily.   Yes [provider]  cetirizine (ZYRTEC) 10 MG tablet Take 10 mg by mouth daily as needed.  04/07/18 04/07/19  [provider]  FLOVENT HFA 44 MCG/ACT inhaler SMARTSIG:2 Puff(s) By Mouth Twice Daily 04/30/19   [provider]  Prenatal Vit-Fe Fumarate-FA (MULTIVITAMIN-PRENATAL) 27-0.8 MG TABS tablet Take by mouth.    [provider]  valACYclovir (VALTREX) 1000 MG tablet Take 1,000 mg by mouth 2 (two) times daily. PRN for onset symptoms  [provider]      Social History: She  reports that she has never smoked. She has never used smokeless tobacco. She reports previous alcohol use. She reports that she does not use drugs.  Family History: family history includes ADD / ADHD in her father; Diabetes in her paternal grandmother; Heart disease in her maternal grandmother; Hypertension in her maternal grandmother and paternal grandmother; Throat  cancer in her maternal uncle.   Review of Systems: A full review of systems was performed and negative except as noted in the HPI.     O:  BP 136/71   Pulse (!) 101   LMP 10/08/2018 (Exact Date)  Results for orders placed or performed during the hospital encounter of 05/24/19 (from the past 48 hour(s))  Wet prep, genital   Collection Time: 05/24/19  8:30 AM   Specimen: Cervix  Result Value Ref Range   Yeast Wet Prep HPF POC NONE SEEN NONE SEEN   Trich, Wet Prep NONE SEEN NONE SEEN   Clue Cells Wet Prep HPF POC NONE SEEN NONE SEEN   WBC, Wet Prep HPF POC MANY (A) NONE SEEN   Sperm NONE SEEN   Chlamydia/NGC rt PCR ([facility] only)   Collection Time: 05/24/19  8:30 AM   Specimen: Cervix  Result Value Ref Range   Specimen source GC/Chlam ENDOCERVICAL    Chlamydia Tr NOT DETECTED NOT DETECTED   N gonorrhoeae NOT DETECTED NOT DETECTED  Group B strep by PCR   Collection Time: 05/24/19  8:30 AM   Specimen: Cervix; Genital  Result Value Ref Range   Group B strep by PCR NEGATIVE NEGATIVE  Fetal fibronectin   Collection Time: 05/24/19  8:30 AM  Result Value Ref Range   Fetal Fibronectin NEGATIVE NEGATIVE  Urinalysis, Complete w Microscopic   Collection Time: 05/24/19  8:30 AM  Result Value Ref Range   Color, Urine STRAW (A) YELLOW   APPearance CLEAR (A) CLEAR   Specific Gravity, Urine 1.008 1.005 - 1.030   pH 6.0 5.0 - 8.0   Glucose, UA NEGATIVE NEGATIVE mg/dL   Hgb urine dipstick NEGATIVE NEGATIVE   Bilirubin Urine NEGATIVE NEGATIVE   Ketones, ur NEGATIVE NEGATIVE mg/dL   Protein, ur NEGATIVE NEGATIVE mg/dL   Nitrite NEGATIVE NEGATIVE   Leukocytes,Ua NEGATIVE NEGATIVE   WBC, UA 0-5 0 - 5 WBC/hpf   Bacteria, UA NONE SEEN NONE SEEN   Squamous Epithelial / LPF 0-5 0 - 5   Mucus PRESENT   Glucose, capillary   Collection Time: 05/24/19  8:44 AM  Result Value Ref Range   Glucose-Capillary 106 (H) 70 - 99 mg/dL  Type and screen   Collection Time: 05/24/19  8:59 AM  Result Value  Ref Range   ABO/RH(D) A NEG    Antibody Screen POS    Sample Expiration 05/27/2019,2359    Antibody Identification      PASSIVELY ACQUIRED ANTI-D NON SPECIFIC ANTIBODY REACTIVITY   Unit Number [id number]    Blood Component Type RED CELLS,LR    Unit division 00    Status of Unit ALLOCATED    Transfusion Status OK TO TRANSFUSE    Crossmatch Result COMPATIBLE    Unit Number [id number]    Blood Component Type RBC LR PHER1    Unit division 00    Status of Unit ALLOCATED    Transfusion Status OK TO TRANSFUSE    Crossmatch Result COMPATIBLE   BPAM RBC   Collection Time: 05/24/19  8:59 AM  Result Value  Ref Range   Blood Product Unit Number [id number]    PRODUCT CODE E0336V00    Unit Type and Rh 0600    Blood Product Expiration Date 779390300923    Blood Product Unit Number [id number]    PRODUCT CODE T6256L89    Unit Type and Rh 0600    Blood Product Expiration Date 373428768115   Respiratory Panel by RT PCR (Flu A&B, Covid) - Nasopharyngeal Swab   Collection Time: 05/24/19  9:11 AM   Specimen: Nasopharyngeal Swab  Result Value Ref Range   SARS Coronavirus 2 by RT PCR NEGATIVE NEGATIVE   Influenza A by PCR NEGATIVE NEGATIVE   Influenza B by PCR NEGATIVE NEGATIVE  CBC with Differential/Platelet   Collection Time: 05/24/19 10:39 AM  Result Value Ref Range   WBC 9.7 4.0 - 10.5 K/uL   RBC 4.89 3.87 - 5.11 MIL/uL   Hemoglobin 10.8 (L) 12.0 - 15.0 g/dL   HCT 72.6 (L) 20.3 - 55.9 %   MCV 71.4 (L) 80.0 - 100.0 fL   MCH 22.1 (L) 26.0 - 34.0 pg   MCHC 30.9 30.0 - 36.0 g/dL   RDW 74.1 63.8 - 45.3 %   Platelets 262 150 - 400 K/uL   nRBC 0.0 0.0 - 0.2 %   Neutrophils Relative % 69 %   Neutro Abs 6.8 1.7 - 7.7 K/uL   Lymphocytes Relative 22 %   Lymphs Abs 2.1 0.7 - 4.0 K/uL   Monocytes Relative 7 %   Monocytes Absolute 0.7 0.1 - 1.0 K/uL   Eosinophils Relative 1 %   Eosinophils Absolute 0.1 0.0 - 0.5 K/uL   Basophils Relative 0 %   Basophils Absolute 0.0 0.0 - 0.1 K/uL    Immature Granulocytes 1 %   Abs Immature Granulocytes 0.06 0.00 - 0.07 K/uL  Glucose, capillary   Collection Time: 05/24/19 11:50 AM  Result Value Ref Range   Glucose-Capillary 174 (H) 70 - 99 mg/dL     Constitutional: NAD, AAOx3  HE/ENT: extraocular movements grossly intact, moist mucous membranes CV: RRR PULM: nl respiratory effort, CTABL     Abd: gravid, non-tender, non-distended, soft      Ext: Non-tender, Nonedematous   Psych: mood appropriate, speech normal Pelvic: repeat SVE: 1/long/OOP, soft/midposition. No cervical change.   Fetal  monitoring: Cat I Appropriate for GA Baseline: 135bpm Variability: moderate Accelerations: present x >2 Decelerations absent  TOCO: uterine irritability, no further UCs since Terb given.       A/P: 19 y.o. [redacted]w[redacted]d here for antenatal surveillance for preterm contractions, gestational diabetes requiring insulin.   Principle Diagnosis:  High risk pregnancy in third trimester   Preterm cervical dilation- UCs stopped and no cervical change after Terb, IV fluids. BMZ #1 given, will need to return BMZ #2 tomorrow.   GDM: reviewed fasting glucose 106 and 2hr PP value 174 today, per pt recall- all fasting are elevated, needs insulin started per Dr Elesa Massed.   Insulin teaching per Inpt diabetes coordinator, Rx called in by [facility] staff to pt pharmacy.    AM insulin Reg 21units, NPH 42 units  PM insulin Reg 15 units, NPH 15 units  Fetal Wellbeing: Reassuring Cat 1 tracing with Reactive NST   D/c home stable, precautions reviewed, follow-up as scheduled.    Randa Ngo, CNM 05/24/2019  1:30 PM

## 2019-05-24 NOTE — Progress Notes (Addendum)
CNM notified of pt arrival to unit and orders requested. RN gave Marylin Crosby of water and encouraged pt to increase oral hydration while in triage.

## 2019-05-24 NOTE — H&P (Addendum)
OB History & Physical   History of Present Illness:  Chief Complaint: DFM and painful UCs overnight.  HPI:  This is a 19 y.o. G2P1001 with IUP at [redacted]w[redacted]d admitted for uterine contractions that started about 2000 last night.  She also reports DFM last night though reports good FM on admission.  She reports:  -active fetal movement -no leakage of fluid -no vaginal bleeding, vaginal discharge or recent HSV outbreaks. Has not been taking suppressive therapy yet.  -onset of contractions at 2000 currently every 3-10+ minutes- reports that these "feel like real labor"  Denies recent IC   Pregnancy Issues: 1. Rh Negative  2. Varicella Non Immune 3. Gestational diabetes  04/27/2019 [redacted]w[redacted]d 1h OGTT 215 4. Closely spaced pregnancies  last delivery 07/2018 5. Hx Chlamydia 08/2018  Retest negative 10/2018 6. 1st tri VB  Rhogam given 01/04/2019 7. Asthma  Controlled with Flovent, prn albuterol inhaler 8. Hidradenitis: admitted with presumed sepsis 03/26/19 9. Hx HSV 10. Bacterial Vaginosis in pregnancy   02/23/19 - episode of cramping, treated w/ flagyl   Last U/S:  03/01/2019: Normal anatomy seen, Single, viable IUP, S=[redacted]w[redacted]d, FHR=150bpm, Cervical length=5.69cm, Rt corpus luteum cyst=1.5cm, Placenta=anterior, Position=transverse, head rt   Maternal Medical History:   Past Medical History:  Diagnosis Date  . Asthma    Seasonal  . First trimester screening   . Gestational diabetes   . Hidradenitis suppurativa of right axilla   . Labor and delivery indication for care or intervention 08/02/2018  . Labor and delivery, indication for care 08/02/2018  . Normal labor and delivery 08/02/2018    Past Surgical History:  Procedure Laterality Date  . INCISION AND DRAINAGE ABSCESS Right 10/10/2015   Procedure: Incision and drainage of complex axillary abscess Excisions of complex hidradenitis on right axilla                            3. Excisional debridement of skin subcutaneous tissue;  Surgeon:  Jules Husbands, MD;  Location: [place];  Service: General;  Laterality: Right;  . WISDOM TOOTH EXTRACTION  2016    No Known Allergies  Prior to Admission medications   Medication Sig Start Date End Date Taking? Authorizing Provider  chlorhexidine (HIBICLENS) 4 % external liquid Apply topically daily as needed. 03/28/19  Yes Theon Sobotka, Murray Hodgkins, CNM  fluticasone (FLONASE) 50 MCG/ACT nasal spray Place 1 spray into both nostrils daily as needed.  03/25/18  Yes [provider]  Prenatal Vit-Fe Fumarate-FA (PRENATAL MULTIVITAMIN) TABS tablet Take 1 tablet by mouth daily at 12 noon.   Yes [provider]  PROAIR HFA 108 (90 Base) MCG/ACT inhaler Inhale 2 puffs into the lungs every 6 (six) hours as needed. 10/26/15  Yes [provider]  sertraline (ZOLOFT) 100 MG tablet Take 100 mg by mouth daily.   Yes [provider]  acetaminophen (TYLENOL) 325 MG tablet Take 2 tablets (650 mg total) by mouth every 4 (four) hours as needed. Patient not taking: Reported on 05/24/2019 03/28/19   Alyssa Mancera, Murray Hodgkins, CNM  cetirizine (ZYRTEC) 10 MG tablet Take 10 mg by mouth daily as needed.  04/07/18 04/07/19  [provider]  clindamycin (CLINDAGEL) 1 % gel Apply to affected area 2 times daily Patient not taking: Reported on 05/24/2019 03/25/19 03/24/20  Lilia Pro., MD  valACYclovir (VALTREX) 1000 MG tablet Take 1,000 mg by mouth 2 (two) times daily. PRN for onset symptoms    [provider]     Prenatal care site: [facility]   Social History: She  reports that she has never smoked. She has never used smokeless tobacco. She reports previous alcohol use. She reports that she does not use drugs.  Family History: family history includes ADD / ADHD in her father; Diabetes in her paternal grandmother; Heart disease in her maternal grandmother; Hypertension in her maternal grandmother and paternal grandmother; Throat cancer in her maternal uncle.   Review  of Systems: A full review of systems was performed and negative except as noted in the HPI.    Physical Exam:  Vital Signs: LMP 10/08/2018 (Exact Date)   General:   alert, cooperative and no distress  Skin:  normal, no acute hidradenitis noted in groin or axilla. No HSV lesions.   Neurologic:    Alert & oriented x 3  Lungs:    Normal respiratory rate and effort.   Heart:   regular rate and rhythm  Abdomen:  soft, NT Gravid. rectus diastasis noted, 5+ cm  Pelvis:  SSE done: mod amt creamy white DC, wet prep and GC/CT sent. FFN obtained and sent. cervix visually dilated. SVE performed: 1cm/long/OOP, unable to determine presenting part. soft/midposition cervix  Presentations:  Bedside US performed, cephalic with subjectively normal fluid.   Extremities: : non-tender, symmetric, no edema bilaterally.  DTRs: 2+      Pertinent Results:  Prenatal Labs: Blood type/Rh A neg  Antibody screen neg  Rubella Immune  Varicella Non-Immune  RPR NR  HBsAg Neg  HIV NR  GC neg  Chlamydia neg  Genetic screening MaterniT21 negative  1 hour GTT 215  3 hour GTT   GBS n/a   FHT: FHR: 135 bpm, variability: moderate,  accelerations:  Present,  decelerations:  Absent Category/reactivity: Reactive NST  Category I TOCO: palpated moderate x 1 during CNM exam, Uterine irritability tracing prior to exam, now noted q54min after readjusting toco.     Assessment:  Tess Potts is a 19 y.o. G29P1001 female at [redacted]w[redacted]d admitted for uterine contractions and recent DFM.  Plan:  1. Obs on Labor & Delivery with preterm UCs.  - Rh neg, s/p rhogam at 28wks - Terb now - IVF- LR at 124ml/hr - consider BMZ - COVID swab- asymptomatic  2. Fetal Well being  - Fetal Tracing: Reactive NST - Presentation: cephalic confirmed by [place]   3. Monitoring of Labor: -  Contractions: external toco in place -  Plan for continuous fetal monitoring  - Labs: Wet prep, GC/CT, GBS, fFN  4. GDM - has not brought glucose log to  recent appt, will check fasting and 2hr PP today while obs status.  - antenatal gestational carb diet ordered.   Randa Ngo, CNM 05/24/2019 8:50 AM

## 2019-05-24 NOTE — OB Triage Note (Signed)
Pt is a 19yo G2P1 at [redacted]w[redacted]d that presents from ED with c/o ctx since last night around 2000 and she is rating ctx pain 7.5/10 scale. Pt states positive FM, denies VB, LOF and states she is a GDM and her sugar was normal this morning. Vitals WDL. EFM applied and Initial FHT 160. Will notify provider of pt arrival to unit.

## 2019-05-24 NOTE — OB Triage Note (Signed)
Orders given for discharge. Discharge instructions reviewed with patient. Pt instructed to return to hospital for evaluation if contractions return and do not improve with hydration or position changes, bright red vaginal bleeding or LOF. Pt verbalized understanding of instructions and denies questions at this time. Pt discharged in stable condition with significant other.

## 2019-05-24 NOTE — Progress Notes (Signed)
FHT tracing maternal while pt was positioned in Right tilt for BMZ injection.

## 2019-05-25 ENCOUNTER — Other Ambulatory Visit: Payer: Self-pay

## 2019-05-25 ENCOUNTER — Observation Stay
Admission: EM | Admit: 2019-05-25 | Discharge: 2019-05-25 | Disposition: A | Discharge status: Home / Self Care | Payer: Medicaid Other | Attending: Certified Nurse Midwife | Admitting: Certified Nurse Midwife

## 2019-05-25 ENCOUNTER — Encounter: Payer: Self-pay | Admitting: Obstetrics and Gynecology

## 2019-05-25 DIAGNOSIS — O479 False labor, unspecified: Secondary | ICD-10-CM | POA: Diagnosis present

## 2019-05-25 DIAGNOSIS — Z7951 Long term (current) use of inhaled steroids: Secondary | ICD-10-CM | POA: Insufficient documentation

## 2019-05-25 DIAGNOSIS — Z79899 Other long term (current) drug therapy: Secondary | ICD-10-CM | POA: Insufficient documentation

## 2019-05-25 DIAGNOSIS — J45909 Unspecified asthma, uncomplicated: Secondary | ICD-10-CM | POA: Insufficient documentation

## 2019-05-25 DIAGNOSIS — Z3A32 32 weeks gestation of pregnancy: Secondary | ICD-10-CM | POA: Insufficient documentation

## 2019-05-25 DIAGNOSIS — O99513 Diseases of the respiratory system complicating pregnancy, third trimester: Secondary | ICD-10-CM | POA: Insufficient documentation

## 2019-05-25 LAB — URINE CULTURE

## 2019-05-25 LAB — RPR: RPR Ser Ql: NONREACTIVE

## 2019-05-25 MED ORDER — TERBUTALINE SULFATE 1 MG/ML IJ SOLN
0.2500 mg | Freq: Once | INTRAMUSCULAR | Status: AC | PRN
  Administered 2019-05-25: 11:00:00 0.25 mg via SUBCUTANEOUS

## 2019-05-25 MED ORDER — BETAMETHASONE SOD PHOS & ACET 6 (3-3) MG/ML IJ SUSP
12.0000 mg | Freq: Once | INTRAMUSCULAR | Status: AC
  Administered 2019-05-25: 11:00:00 12 mg via INTRAMUSCULAR

## 2019-05-25 MED ORDER — TERBUTALINE SULFATE 1 MG/ML IJ SOLN
INTRAMUSCULAR | Status: AC
  Filled 2019-05-25: qty 1

## 2019-05-25 MED ORDER — ACETAMINOPHEN 500 MG PO TABS
1000.0000 mg | ORAL_TABLET | Freq: Four times a day (QID) | ORAL | Status: DC | PRN
  Administered 2019-05-25: 1000 mg via ORAL
  Filled 2019-05-25: qty 2

## 2019-05-25 NOTE — Discharge Summary (Signed)
Morgan Collier is a 19 y.o. female. She is at [redacted]w[redacted]d gestation. Patient's last menstrual period was 10/08/2018 (exact date). Estimated Date of Delivery: 07/15/19  Prenatal care site: [facility]   Chief complaint: contractions Location: abdomen Onset/timing: initially started 05/23/2019 around 2000  Duration: intermittent Quality: cramping Severity: 8/10 pain Aggravating or alleviating conditions: Associated signs/symptoms: Context: Morgan Collier was seen in triage yesterday for contractions that started the previous night. Yesterday she was reporting contractions every 3-10 minutes that felt like labor. She was treated with IV fluids and one dose of terbutaline. Her contractions stopped after this and she had no cervical change. She is here today with reports that her contractions continued after discharge and got worse aroudn midnight. She reports that they are not 8/10 pain and feel like labor.   S: Resting comfortably.   She reports:  -active fetal movement -no leakage of fluid -no vaginal bleeding, no vaginal discharge, and no recent HSV outbreaks  Maternal Medical History:   Past Medical History:  Diagnosis Date  . Asthma    Seasonal  . First trimester screening   . Gestational diabetes   . Hidradenitis suppurativa of right axilla   . Labor and delivery indication for care or intervention 08/02/2018  . Labor and delivery, indication for care 08/02/2018  . Normal labor and delivery 08/02/2018    Past Surgical History:  Procedure Laterality Date  . INCISION AND DRAINAGE ABSCESS Right 10/10/2015   Procedure: Incision and drainage of complex axillary abscess Excisions of complex hidradenitis on right axilla                            3. Excisional debridement of skin subcutaneous tissue;  Surgeon: Leafy Ro, MD;  Location: [facility];  Service: General;  Laterality: Right;  . WISDOM TOOTH EXTRACTION  2016    No Known Allergies  Prior to Admission medications    Medication Sig Start Date End Date Taking? Authorizing Provider  FLOVENT HFA 44 MCG/ACT inhaler SMARTSIG:2 Puff(s) By Mouth Twice Daily 04/30/19  Yes [provider]  fluticasone (FLONASE) 50 MCG/ACT nasal spray Place 1 spray into both nostrils daily as needed.  03/25/18  Yes [provider]  Prenatal Vit-Fe Fumarate-FA (PRENATAL MULTIVITAMIN) TABS tablet Take 1 tablet by mouth daily at 12 noon.   Yes [provider]  PROAIR HFA 108 (90 Base) MCG/ACT inhaler Inhale 2 puffs into the lungs every 6 (six) hours as needed. 10/26/15  Yes [provider]  sertraline (ZOLOFT) 100 MG tablet Take 100 mg by mouth daily.   Yes [provider]  valACYclovir (VALTREX) 1000 MG tablet Take 1,000 mg by mouth 2 (two) times daily. PRN for onset symptoms   Yes [provider]  cetirizine (ZYRTEC) 10 MG tablet Take 10 mg by mouth daily as needed.  04/07/18 05/24/19  [provider]  chlorhexidine (HIBICLENS) 4 % external liquid Apply topically daily as needed. 03/28/19   McVey, Prudencio Pair, CNM     Social History: She  reports that she has never smoked. She has never used smokeless tobacco. She reports previous alcohol use. She reports that she does not use drugs.  Family History: family history includes ADD / ADHD in her father; Diabetes in her paternal grandmother; Heart disease in her maternal grandmother; Hypertension in her maternal grandmother and paternal grandmother; Throat cancer in her maternal uncle.   Review of Systems: A full review of systems was  performed and negative except as noted in the HPI.     O:  BP 124/65 (BP Location: Right Arm)   Pulse (!) 109   Temp 98.3 F (36.8 C) (Oral)   Resp 18   Ht 5\' 2"  (1.575 m)   Wt 77.1 kg   LMP 10/08/2018 (Exact Date)   BMI 31.09 kg/m  Results for orders placed or performed during the hospital encounter of 05/24/19 (from the past 48 hour(s))  Wet prep, genital   Collection Time: 05/24/19  8:30 AM    Specimen: Cervix  Result Value Ref Range   Yeast Wet Prep HPF POC NONE SEEN NONE SEEN   Trich, Wet Prep NONE SEEN NONE SEEN   Clue Cells Wet Prep HPF POC NONE SEEN NONE SEEN   WBC, Wet Prep HPF POC MANY (A) NONE SEEN   Sperm NONE SEEN   Chlamydia/NGC rt PCR ([facility] only)   Collection Time: 05/24/19  8:30 AM   Specimen: Cervix  Result Value Ref Range   Specimen source GC/Chlam ENDOCERVICAL    Chlamydia Tr NOT DETECTED NOT DETECTED   N gonorrhoeae NOT DETECTED NOT DETECTED  Group B strep by PCR   Collection Time: 05/24/19  8:30 AM   Specimen: Cervix; Genital  Result Value Ref Range   Group B strep by PCR NEGATIVE NEGATIVE  Urine Culture   Collection Time: 05/24/19  8:30 AM   Specimen: Urine, Random  Result Value Ref Range   Specimen Description      URINE, RANDOM Performed at [facility], [address] [place], [place]    Special Requests      NONE Performed at [facility], [address] [place], [place]    Culture MULTIPLE SPECIES PRESENT, SUGGEST RECOLLECTION (A)    Report Status 05/25/2019 FINAL   Fetal fibronectin   Collection Time: 05/24/19  8:30 AM  Result Value Ref Range   Fetal Fibronectin NEGATIVE NEGATIVE  Urinalysis, Complete w Microscopic   Collection Time: 05/24/19  8:30 AM  Result Value Ref Range   Color, Urine STRAW (A) YELLOW   APPearance CLEAR (A) CLEAR   Specific Gravity, Urine 1.008 1.005 - 1.030   pH 6.0 5.0 - 8.0   Glucose, UA NEGATIVE NEGATIVE mg/dL   Hgb urine dipstick NEGATIVE NEGATIVE   Bilirubin Urine NEGATIVE NEGATIVE   Ketones, ur NEGATIVE NEGATIVE mg/dL   Protein, ur NEGATIVE NEGATIVE mg/dL   Nitrite NEGATIVE NEGATIVE   Leukocytes,Ua NEGATIVE NEGATIVE   WBC, UA 0-5 0 - 5 WBC/hpf   Bacteria, UA NONE SEEN NONE SEEN   Squamous Epithelial / LPF 0-5 0 - 5   Mucus PRESENT   Glucose, capillary   Collection Time: 05/24/19  8:44 AM  Result Value Ref Range   Glucose-Capillary 106 (H) 70 - 99  mg/dL  Type and screen   Collection Time: 05/24/19  8:59 AM  Result Value Ref Range   ABO/RH(D) A NEG    Antibody Screen POS    Sample Expiration 05/27/2019,2359    Antibody Identification      PASSIVELY ACQUIRED ANTI-D NON SPECIFIC ANTIBODY REACTIVITY   Unit Number 05/29/2019    Blood Component Type RED CELLS,LR    Unit division 00    Status of Unit REL FROM [facility]    Transfusion Status OK TO TRANSFUSE    Crossmatch Result COMPATIBLE    Unit Number [facility]    Blood Component Type RBC LR PHER1    Unit division 00  Status of Unit REL FROM [facility]    Transfusion Status OK TO TRANSFUSE    Crossmatch Result COMPATIBLE   BPAM RBC   Collection Time: 05/24/19  8:59 AM  Result Value Ref Range   Blood Product Unit Number [id number]    PRODUCT CODE E0336V00    Unit Type and Rh 0600    Blood Product Expiration Date 387564332951    Blood Product Unit Number [id number]    PRODUCT CODE F0932T55    Unit Type and Rh 0600    Blood Product Expiration Date 732202542706   Respiratory Panel by RT PCR (Flu A&B, Covid) - Nasopharyngeal Swab   Collection Time: 05/24/19  9:11 AM   Specimen: Nasopharyngeal Swab  Result Value Ref Range   SARS Coronavirus 2 by RT PCR NEGATIVE NEGATIVE   Influenza A by PCR NEGATIVE NEGATIVE   Influenza B by PCR NEGATIVE NEGATIVE  RPR   Collection Time: 05/24/19 10:39 AM  Result Value Ref Range   RPR Ser Ql NON REACTIVE NON REACTIVE  CBC with Differential/Platelet   Collection Time: 05/24/19 10:39 AM  Result Value Ref Range   WBC 9.7 4.0 - 10.5 K/uL   RBC 4.89 3.87 - 5.11 MIL/uL   Hemoglobin 10.8 (L) 12.0 - 15.0 g/dL   HCT 34.9 (L) 36.0 - 46.0 %   MCV 71.4 (L) 80.0 - 100.0 fL   MCH 22.1 (L) 26.0 - 34.0 pg   MCHC 30.9 30.0 - 36.0 g/dL   RDW 15.3 11.5 - 15.5 %   Platelets 262 150 - 400 K/uL   nRBC 0.0 0.0 - 0.2 %   Neutrophils Relative % 69 %   Neutro Abs 6.8 1.7 - 7.7 K/uL   Lymphocytes Relative 22 %   Lymphs Abs 2.1 0.7 - 4.0 K/uL    Monocytes Relative 7 %   Monocytes Absolute 0.7 0.1 - 1.0 K/uL   Eosinophils Relative 1 %   Eosinophils Absolute 0.1 0.0 - 0.5 K/uL   Basophils Relative 0 %   Basophils Absolute 0.0 0.0 - 0.1 K/uL   Immature Granulocytes 1 %   Abs Immature Granulocytes 0.06 0.00 - 0.07 K/uL  Glucose, capillary   Collection Time: 05/24/19 11:50 AM  Result Value Ref Range   Glucose-Capillary 174 (H) 70 - 99 mg/dL     Constitutional: NAD, AAOx3  HE/ENT: extraocular movements grossly intact, moist mucous membranes CV: RRR PULM: normal respiratory effort, CTABL Back: symmetric, no CVAT     Abd: gravid, non-tender, non-distended, soft      Ext: Non-tender, Nonedmeatous   Psych: mood appropriate, speech normal Pelvic: cervix 1cm/thick/ballotable, no change over 3.5 hours per RN exam  NST/monitoring:  Baseline: 130-135bpm Variability: moderate Accelerations: 15x15 preset frequently Decelerations: absent Time: 2 hours Toco: after terbutaline, some uterine irritability with no contractions   A/P: 19 y.o. [redacted]w[redacted]d here for antenatal surveillance during pregnancy.  Principle diagnosis: Contractions without labor  Preterm contractions ? S/p terbutaline x1 ? Uterine irritability with no contractions after terbutaline. Toco moved to more than one position for monitoring.  ? To push PO fluids ? Cervix unchanged from yesterday per RN exam over 3.5 hours.  ? Second betamethasone dose given  Fetal Wellbeing ? Reactive NST, reassuring for GA  D/c home stable, precautions reviewed, follow-up as scheduled.    Lisette Grinder 05/25/2019 2:26 PM  ----- Lisette Grinder, CNM Certified Nurse Midwife [facility], Department of [facility]

## 2019-05-25 NOTE — OB Triage Note (Signed)
Pt presents to L&D c/o ctx that she has been having since yesterday. Pt states ctx have got worse since midnight. Pt rates ctx 8/10 on pain scale. Pt denies bleeding or LOF. Reports positive fetal movement. Vitals WNL. Will continue to monitor.

## 2019-05-25 NOTE — Progress Notes (Signed)
Morgan Collier is a 19 y.o. female. She is at [redacted]w[redacted]d gestation. Patient's last menstrual period was 10/08/2018 (exact date). Estimated Date of Delivery: 07/15/19  Prenatal care site: [facility]   Chief complaint: contractions Location: abdomen Onset/timing: initially started 05/23/2019 around 2000  Duration: intermittent Quality: cramping Severity: 8/10 pain Aggravating or alleviating conditions: Associated signs/symptoms: Context: [name] was seen in triage yesterday for contractions that started the previous night. Yesterday she was reporting contractions every 3-10 minutes that felt like labor. She was treated with IV fluids and one dose of terbutaline. Her contractions stopped after this and she had no cervical change. She is here today with reports that her contractions continued after discharge and got worse aroudn midnight. She reports that they are not 8/10 pain and feel like labor.   S: Resting comfortably.   She reports:  -active fetal movement -no leakage of fluid -no vaginal bleeding, no vaginal discharge, and no recent HSV outbreaks  Maternal Medical History:   Past Medical History:  Diagnosis Date  . Asthma    Seasonal  . First trimester screening   . Gestational diabetes   . Hidradenitis suppurativa of right axilla   . Labor and delivery indication for care or intervention 08/02/2018  . Labor and delivery, indication for care 08/02/2018  . Normal labor and delivery 08/02/2018    Past Surgical History:  Procedure Laterality Date  . INCISION AND DRAINAGE ABSCESS Right 10/10/2015   Procedure: Incision and drainage of complex axillary abscess Excisions of complex hidradenitis on right axilla                            3. Excisional debridement of skin subcutaneous tissue;  Surgeon: Leafy Ro, MD;  Location: [facility];  Service: General;  Laterality: Right;  . WISDOM TOOTH EXTRACTION  2016    No Known Allergies  Prior to Admission medications    Medication Sig Start Date End Date Taking? Authorizing Provider  FLOVENT HFA 44 MCG/ACT inhaler SMARTSIG:2 Puff(s) By Mouth Twice Daily 04/30/19  Yes [provider]  fluticasone (FLONASE) 50 MCG/ACT nasal spray Place 1 spray into both nostrils daily as needed.  03/25/18  Yes [provider]  Prenatal Vit-Fe Fumarate-FA (PRENATAL MULTIVITAMIN) TABS tablet Take 1 tablet by mouth daily at 12 noon.   Yes [provider]  PROAIR HFA 108 (90 Base) MCG/ACT inhaler Inhale 2 puffs into the lungs every 6 (six) hours as needed. 10/26/15  Yes [provider]  sertraline (ZOLOFT) 100 MG tablet Take 100 mg by mouth daily.   Yes [provider]  valACYclovir (VALTREX) 1000 MG tablet Take 1,000 mg by mouth 2 (two) times daily. PRN for onset symptoms   Yes [provider]  cetirizine (ZYRTEC) 10 MG tablet Take 10 mg by mouth daily as needed.  04/07/18 05/24/19  [provider]  chlorhexidine (HIBICLENS) 4 % external liquid Apply topically daily as needed. 03/28/19   McVey, Prudencio Pair, CNM     Social History: She  reports that she has never smoked. She has never used smokeless tobacco. She reports previous alcohol use. She reports that she does not use drugs.  Family History: family history includes ADD / ADHD in her father; Diabetes in her paternal grandmother; Heart disease in her maternal grandmother; Hypertension in her maternal grandmother and paternal grandmother; Throat cancer in her maternal uncle.   Review of Systems: A full review of systems was  performed and negative except as noted in the HPI.     O:  BP 124/65 (BP Location: Right Arm)   Pulse (!) 109   Temp 98.3 F (36.8 C) (Oral)   Resp 18   Ht 5\' 2"  (1.575 m)   Wt 77.1 kg   LMP 10/08/2018 (Exact Date)   BMI 31.09 kg/m  Results for orders placed or performed during the hospital encounter of 05/24/19 (from the past 48 hour(s))  Wet prep, genital   Collection Time: 05/24/19  8:30 AM    Specimen: Cervix  Result Value Ref Range   Yeast Wet Prep HPF POC NONE SEEN NONE SEEN   Trich, Wet Prep NONE SEEN NONE SEEN   Clue Cells Wet Prep HPF POC NONE SEEN NONE SEEN   WBC, Wet Prep HPF POC MANY (A) NONE SEEN   Sperm NONE SEEN   Chlamydia/NGC rt PCR ([facility] only)   Collection Time: 05/24/19  8:30 AM   Specimen: Cervix  Result Value Ref Range   Specimen source GC/Chlam ENDOCERVICAL    Chlamydia Tr NOT DETECTED NOT DETECTED   N gonorrhoeae NOT DETECTED NOT DETECTED  Group B strep by PCR   Collection Time: 05/24/19  8:30 AM   Specimen: Cervix; Genital  Result Value Ref Range   Group B strep by PCR NEGATIVE NEGATIVE  Urine Culture   Collection Time: 05/24/19  8:30 AM   Specimen: Urine, Random  Result Value Ref Range   Specimen Description      URINE, RANDOM Performed at [facility], [address] [place], [place]    Special Requests      NONE Performed at [facility], [address] [place], [place]    Culture MULTIPLE SPECIES PRESENT, SUGGEST RECOLLECTION (A)    Report Status 05/25/2019 FINAL   Fetal fibronectin   Collection Time: 05/24/19  8:30 AM  Result Value Ref Range   Fetal Fibronectin NEGATIVE NEGATIVE  Urinalysis, Complete w Microscopic   Collection Time: 05/24/19  8:30 AM  Result Value Ref Range   Color, Urine STRAW (A) YELLOW   APPearance CLEAR (A) CLEAR   Specific Gravity, Urine 1.008 1.005 - 1.030   pH 6.0 5.0 - 8.0   Glucose, UA NEGATIVE NEGATIVE mg/dL   Hgb urine dipstick NEGATIVE NEGATIVE   Bilirubin Urine NEGATIVE NEGATIVE   Ketones, ur NEGATIVE NEGATIVE mg/dL   Protein, ur NEGATIVE NEGATIVE mg/dL   Nitrite NEGATIVE NEGATIVE   Leukocytes,Ua NEGATIVE NEGATIVE   WBC, UA 0-5 0 - 5 WBC/hpf   Bacteria, UA NONE SEEN NONE SEEN   Squamous Epithelial / LPF 0-5 0 - 5   Mucus PRESENT   Glucose, capillary   Collection Time: 05/24/19  8:44 AM  Result Value Ref Range   Glucose-Capillary 106 (H) 70 - 99  mg/dL  Type and screen   Collection Time: 05/24/19  8:59 AM  Result Value Ref Range   ABO/RH(D) A NEG    Antibody Screen POS    Sample Expiration 05/27/2019,2359    Antibody Identification      PASSIVELY ACQUIRED ANTI-D NON SPECIFIC ANTIBODY REACTIVITY   Unit Number 05/29/2019    Blood Component Type RED CELLS,LR    Unit division 00    Status of Unit REL FROM [facility]    Transfusion Status OK TO TRANSFUSE    Crossmatch Result COMPATIBLE    Unit Number [facility]    Blood Component Type RBC LR PHER1    Unit division 00  Status of Unit REL FROM [facility]    Transfusion Status OK TO TRANSFUSE    Crossmatch Result COMPATIBLE   BPAM RBC   Collection Time: 05/24/19  8:59 AM  Result Value Ref Range   Blood Product Unit Number [id number]    PRODUCT CODE E0336V00    Unit Type and Rh 0600    Blood Product Expiration Date 937902409735    Blood Product Unit Number [id number]    PRODUCT CODE Q2229N98    Unit Type and Rh 0600    Blood Product Expiration Date 921194174081   Respiratory Panel by RT PCR (Flu A&B, Covid) - Nasopharyngeal Swab   Collection Time: 05/24/19  9:11 AM   Specimen: Nasopharyngeal Swab  Result Value Ref Range   SARS Coronavirus 2 by RT PCR NEGATIVE NEGATIVE   Influenza A by PCR NEGATIVE NEGATIVE   Influenza B by PCR NEGATIVE NEGATIVE  RPR   Collection Time: 05/24/19 10:39 AM  Result Value Ref Range   RPR Ser Ql NON REACTIVE NON REACTIVE  CBC with Differential/Platelet   Collection Time: 05/24/19 10:39 AM  Result Value Ref Range   WBC 9.7 4.0 - 10.5 K/uL   RBC 4.89 3.87 - 5.11 MIL/uL   Hemoglobin 10.8 (L) 12.0 - 15.0 g/dL   HCT 34.9 (L) 36.0 - 46.0 %   MCV 71.4 (L) 80.0 - 100.0 fL   MCH 22.1 (L) 26.0 - 34.0 pg   MCHC 30.9 30.0 - 36.0 g/dL   RDW 15.3 11.5 - 15.5 %   Platelets 262 150 - 400 K/uL   nRBC 0.0 0.0 - 0.2 %   Neutrophils Relative % 69 %   Neutro Abs 6.8 1.7 - 7.7 K/uL   Lymphocytes Relative 22 %   Lymphs Abs 2.1 0.7 - 4.0 K/uL    Monocytes Relative 7 %   Monocytes Absolute 0.7 0.1 - 1.0 K/uL   Eosinophils Relative 1 %   Eosinophils Absolute 0.1 0.0 - 0.5 K/uL   Basophils Relative 0 %   Basophils Absolute 0.0 0.0 - 0.1 K/uL   Immature Granulocytes 1 %   Abs Immature Granulocytes 0.06 0.00 - 0.07 K/uL  Glucose, capillary   Collection Time: 05/24/19 11:50 AM  Result Value Ref Range   Glucose-Capillary 174 (H) 70 - 99 mg/dL      Pelvic: 1cm/thick/ballotable per RN exam at 1100  NST:  Baseline: 135bpm  Variability: moderate Accelerations: 15x15 present x >2 Decelerations: absent Time: 44mins Toco: irritability with occasional contraction   A/P: 19 y.o. [redacted]w[redacted]d here for antenatal surveillance during pregnancy.  Principle diagnosis: Preterm cervical contractions  Preterm contractions  Occasional contractions with uterine irritability  S/p terbutaline x1  To push PO fluids  Cervix unchanged from yesterday per RN exam. Plan to recheck in 1-2 hours.   Second betamethasone dose given  Fetal Wellbeing  Reactive NST, reassuring for Jonna Munro 05/25/2019 11:15 AM  ----- Lisette Grinder, CNM Certified Nurse Midwife [facility], Department of [facility]

## 2019-06-20 ENCOUNTER — Encounter: Payer: Self-pay | Admitting: Obstetrics and Gynecology

## 2019-06-20 ENCOUNTER — Other Ambulatory Visit: Payer: Self-pay

## 2019-06-20 ENCOUNTER — Observation Stay
Admission: EM | Admit: 2019-06-20 | Discharge: 2019-06-20 | Disposition: A | Discharge status: Home / Self Care | Payer: Medicaid Other | Attending: Obstetrics and Gynecology | Admitting: Obstetrics and Gynecology

## 2019-06-20 DIAGNOSIS — O368130 Decreased fetal movements, third trimester, not applicable or unspecified: Secondary | ICD-10-CM | POA: Diagnosis not present

## 2019-06-20 DIAGNOSIS — Z3490 Encounter for supervision of normal pregnancy, unspecified, unspecified trimester: Secondary | ICD-10-CM | POA: Diagnosis present

## 2019-06-20 DIAGNOSIS — Z3A36 36 weeks gestation of pregnancy: Secondary | ICD-10-CM | POA: Diagnosis not present

## 2019-06-20 DIAGNOSIS — O99513 Diseases of the respiratory system complicating pregnancy, third trimester: Secondary | ICD-10-CM | POA: Insufficient documentation

## 2019-06-20 DIAGNOSIS — Z833 Family history of diabetes mellitus: Secondary | ICD-10-CM | POA: Insufficient documentation

## 2019-06-20 DIAGNOSIS — Z7951 Long term (current) use of inhaled steroids: Secondary | ICD-10-CM | POA: Diagnosis not present

## 2019-06-20 DIAGNOSIS — Z79899 Other long term (current) drug therapy: Secondary | ICD-10-CM | POA: Insufficient documentation

## 2019-06-20 DIAGNOSIS — Z800 Family history of malignant neoplasm of digestive organs: Secondary | ICD-10-CM | POA: Insufficient documentation

## 2019-06-20 DIAGNOSIS — J45909 Unspecified asthma, uncomplicated: Secondary | ICD-10-CM | POA: Insufficient documentation

## 2019-06-20 DIAGNOSIS — Z818 Family history of other mental and behavioral disorders: Secondary | ICD-10-CM | POA: Insufficient documentation

## 2019-06-20 DIAGNOSIS — Z8249 Family history of ischemic heart disease and other diseases of the circulatory system: Secondary | ICD-10-CM | POA: Insufficient documentation

## 2019-06-20 LAB — WET PREP, GENITAL
Clue Cells Wet Prep HPF POC: NONE SEEN
Sperm: NONE SEEN
Trich, Wet Prep: NONE SEEN
Yeast Wet Prep HPF POC: NONE SEEN

## 2019-06-20 LAB — URINALYSIS, ROUTINE W REFLEX MICROSCOPIC
Bilirubin Urine: NEGATIVE
Glucose, UA: 150 mg/dL — AB
Hgb urine dipstick: NEGATIVE
Ketones, ur: 5 mg/dL — AB
Nitrite: NEGATIVE
Protein, ur: NEGATIVE mg/dL
Specific Gravity, Urine: 1.013 (ref 1.005–1.030)
pH: 5 (ref 5.0–8.0)

## 2019-06-20 NOTE — Discharge Summary (Signed)
Morgan Collier is a 19 y.o. female. She is at [redacted]w[redacted]d gestation. Patient's last menstrual period was 10/08/2018 (exact date). Estimated Date of Delivery: 07/15/19  [facility]renatal care site: [facility]   Current pregnancy complicated by:  1. Rh Negative  2. Varicella Non Immune 3. Gestational diabetes  04/27/2019 [redacted]w[redacted]d 1h OGTT 215 4. Closely spaced pregnancies  last delivery 07/2018 5. Hx Chlamydia 08/2018  Retest negative 10/2018 6. 1st tri VB  Rhogam given 01/04/2019 7. Asthma  Controlled with Flovent, prn albuterol inhaler 8. Hidradenitis: admitted with presumed sepsis 03/26/19 9. Hx HSV 10. Bacterial Vaginosis in pregnancy   02/23/19 - episode of cramping, treated w/ flagyl   Chief complaint:Complains of decreased fetal movement since "6 hours ago" and of lots of pressure. Some spotting reported to RN.     Maternal Medical History:   Past Medical History:  Diagnosis Date  . Asthma    Seasonal  . First trimester screening   . Gestational diabetes   . Hidradenitis suppurativa of right axilla   . Labor and delivery indication for care or intervention 08/02/2018  . Labor and delivery, indication for care 08/02/2018  . Normal labor and delivery 08/02/2018    Past Surgical History:  Procedure Laterality Date  . INCISION AND DRAINAGE ABSCESS Right 10/10/2015   Procedure: Incision and drainage of complex axillary abscess Excisions of complex hidradenitis on right axilla                            3. Excisional debridement of skin subcutaneous tissue;  Surgeon: Leafy Ro, MD;  Location: [facility];  Service: General;  Laterality: Right;  . WISDOM TOOTH EXTRACTION  2016    No Known Allergies  Prior to Admission medications   Medication Sig Start Date End Date Taking? Authorizing Provider  FLOVENT HFA 44 MCG/ACT inhaler SMARTSIG:2 Puff(s) By Mouth Twice Daily 04/30/19  Yes [provider]  Prenatal Vit-Fe Fumarate-FA (PRENATAL MULTIVITAMIN) TABS tablet  Take 1 tablet by mouth daily at 12 noon.   Yes [provider]  sertraline (ZOLOFT) 100 MG tablet Take 100 mg by mouth daily.   Yes [provider]  valACYclovir (VALTREX) 1000 MG tablet Take 1,000 mg by mouth 2 (two) times daily. PRN for onset symptoms   Yes [provider]  cetirizine (ZYRTEC) 10 MG tablet Take 10 mg by mouth daily as needed.  04/07/18 05/24/19  [provider]  chlorhexidine (HIBICLENS) 4 % external liquid Apply topically daily as needed. 03/28/19   Weslee Prestage, Prudencio Pair, CNM  fluticasone (FLONASE) 50 MCG/ACT nasal spray Place 1 spray into both nostrils daily as needed.  03/25/18   [provider]  PROAIR HFA 108 (90 Base) MCG/ACT inhaler Inhale 2 puffs into the lungs every 6 (six) hours as needed. 10/26/15   [provider]      Social History: She  reports that she has never smoked. She has never used smokeless tobacco. She reports previous alcohol use. She reports that she does not use drugs.  Family History: family history includes ADD / ADHD in her father; Diabetes in her paternal grandmother; Heart disease in her maternal grandmother; Hypertension in her maternal grandmother and paternal grandmother; Throat cancer in her maternal uncle.   Review of Systems: A full review of systems was performed and negative except as noted in the HPI.     O:  BP 122/75 (BP Location: Left Arm)   Pulse Marland Kitchen)  108   Temp 98.1 F (36.7 C) (Oral)   Resp 18   Ht 5\' 2"  (1.575 m)   Wt 77.1 kg   LMP 10/08/2018 (Exact Date)   BMI 31.09 kg/m  Results for orders placed or performed during the hospital encounter of 06/20/19 (from the past 48 hour(s))  Wet prep, genital   Collection Time: 06/20/19  4:15 AM   Specimen: Vaginal  Result Value Ref Range   Yeast Wet Prep HPF POC NONE SEEN NONE SEEN   Trich, Wet Prep NONE SEEN NONE SEEN   Clue Cells Wet Prep HPF POC NONE SEEN NONE SEEN   WBC, Wet Prep HPF POC MODERATE (A) NONE SEEN   Sperm NONE SEEN    Urinalysis, Routine w reflex microscopic   Collection Time: 06/20/19  4:15 AM  Result Value Ref Range   Color, Urine YELLOW (A) YELLOW   APPearance CLOUDY (A) CLEAR   Specific Gravity, Urine 1.013 1.005 - 1.030   pH 5.0 5.0 - 8.0   Glucose, UA 150 (A) NEGATIVE mg/dL   Hgb urine dipstick NEGATIVE NEGATIVE   Bilirubin Urine NEGATIVE NEGATIVE   Ketones, ur 5 (A) NEGATIVE mg/dL   Protein, ur NEGATIVE NEGATIVE mg/dL   Nitrite NEGATIVE NEGATIVE   Leukocytes,Ua LARGE (A) NEGATIVE   RBC / HPF 0-5 0 - 5 RBC/hpf   WBC, UA 21-50 0 - 5 WBC/hpf   Bacteria, UA FEW (A) NONE SEEN   Squamous Epithelial / LPF 11-20 0 - 5   Mucus PRESENT       Pelvic: SVE per nursing: Dilation: Fingertip Effacement (%): Thick Cervical Position: Posterior Presentation: Undeterminable Exam by:: Almon Hercules RN  Fetal  monitoring: Cat I Appropriate for GA Baseline: 140 Variability: moderate Accelerations: present x >2 Decelerations absent  TOCO: irreg UCs   A/P: 19 y.o. [redacted]w[redacted]d here for antenatal surveillance for contractions, pelvic pressure and decreased fetal movement  Principle Diagnosis:  Decreased fetal movement, preterm contractions and pelvic pressure.    Labor: not present.   Fetal Wellbeing: Reassuring Cat 1 tracing and reactive NST   encoruaged f/u in office this week.   D/c home stable, precautions reviewed, follow-up as scheduled.    Francetta Found, CNM 06/20/2019  7:50 AM

## 2019-06-20 NOTE — OB Triage Note (Signed)
Pt G2P1 36wks 3days.  Complains of decreased fetal movement since "6 hours ago" and of lots of pressure. States some bright red vaginal spotting. Reports no LOF. VSS Monitors on and assessing FHT 145 at 03:35.

## 2019-06-22 LAB — OB RESULTS CONSOLE GBS: GBS: POSITIVE

## 2019-06-22 LAB — OB RESULTS CONSOLE RPR: RPR: NONREACTIVE

## 2019-06-22 LAB — OB RESULTS CONSOLE HIV ANTIBODY (ROUTINE TESTING): HIV: NONREACTIVE

## 2019-06-27 ENCOUNTER — Inpatient Hospital Stay: Payer: Medicaid Other | Admitting: Anesthesiology

## 2019-06-27 ENCOUNTER — Inpatient Hospital Stay
Admission: EM | Admit: 2019-06-27 | Discharge: 2019-06-30 | DRG: 787 | Disposition: A | Discharge status: Home / Self Care | Payer: Medicaid Other | Attending: Certified Nurse Midwife | Admitting: Certified Nurse Midwife

## 2019-06-27 ENCOUNTER — Encounter: Payer: Self-pay | Admitting: Obstetrics and Gynecology

## 2019-06-27 ENCOUNTER — Other Ambulatory Visit: Payer: Self-pay

## 2019-06-27 DIAGNOSIS — D62 Acute posthemorrhagic anemia: Secondary | ICD-10-CM | POA: Diagnosis not present

## 2019-06-27 DIAGNOSIS — O9832 Other infections with a predominantly sexual mode of transmission complicating childbirth: Secondary | ICD-10-CM | POA: Diagnosis present

## 2019-06-27 DIAGNOSIS — Z3A37 37 weeks gestation of pregnancy: Secondary | ICD-10-CM | POA: Diagnosis not present

## 2019-06-27 DIAGNOSIS — O9081 Anemia of the puerperium: Secondary | ICD-10-CM | POA: Diagnosis not present

## 2019-06-27 DIAGNOSIS — Z20822 Contact with and (suspected) exposure to covid-19: Secondary | ICD-10-CM | POA: Diagnosis present

## 2019-06-27 DIAGNOSIS — O9952 Diseases of the respiratory system complicating childbirth: Secondary | ICD-10-CM | POA: Diagnosis present

## 2019-06-27 DIAGNOSIS — Z6791 Unspecified blood type, Rh negative: Secondary | ICD-10-CM | POA: Diagnosis not present

## 2019-06-27 DIAGNOSIS — O26893 Other specified pregnancy related conditions, third trimester: Secondary | ICD-10-CM | POA: Diagnosis present

## 2019-06-27 DIAGNOSIS — J45909 Unspecified asthma, uncomplicated: Secondary | ICD-10-CM | POA: Diagnosis present

## 2019-06-27 DIAGNOSIS — O24424 Gestational diabetes mellitus in childbirth, insulin controlled: Principal | ICD-10-CM | POA: Diagnosis present

## 2019-06-27 DIAGNOSIS — O99824 Streptococcus B carrier state complicating childbirth: Secondary | ICD-10-CM | POA: Diagnosis present

## 2019-06-27 DIAGNOSIS — A6000 Herpesviral infection of urogenital system, unspecified: Secondary | ICD-10-CM | POA: Diagnosis present

## 2019-06-27 LAB — BASIC METABOLIC PANEL
Anion gap: 9 (ref 5–15)
BUN: 5 mg/dL — ABNORMAL LOW (ref 6–20)
CO2: 20 mmol/L — ABNORMAL LOW (ref 22–32)
Calcium: 9 mg/dL (ref 8.9–10.3)
Chloride: 105 mmol/L (ref 98–111)
Creatinine, Ser: 0.57 mg/dL (ref 0.44–1.00)
GFR calc Af Amer: >60 mL/min (ref >60)
GFR calc non Af Amer: >60 mL/min (ref >60)
Glucose, Bld: 96 mg/dL (ref 70–99)
Potassium: 4 mmol/L (ref 3.5–5.1)
Sodium: 134 mmol/L — ABNORMAL LOW (ref 135–145)

## 2019-06-27 LAB — CBC
HCT: 34.6 % — ABNORMAL LOW (ref 36.0–46.0)
Hemoglobin: 11.1 g/dL — ABNORMAL LOW (ref 12.0–15.0)
MCH: 21.6 pg — ABNORMAL LOW (ref 26.0–34.0)
MCHC: 32.1 g/dL (ref 30.0–36.0)
MCV: 67.2 fL — ABNORMAL LOW (ref 80.0–100.0)
Platelets: 254 10*3/uL (ref 150–400)
RBC: 5.15 MIL/uL — ABNORMAL HIGH (ref 3.87–5.11)
RDW: 17.5 % — ABNORMAL HIGH (ref 11.5–15.5)
WBC: 10.4 10*3/uL (ref 4.0–10.5)
nRBC: 0 % (ref 0.0–0.2)

## 2019-06-27 LAB — GLUCOSE, CAPILLARY
Glucose-Capillary: 95 mg/dL (ref 70–99)
Glucose-Capillary: 126 mg/dL — ABNORMAL HIGH (ref 70–99)
Glucose-Capillary: 75 mg/dL (ref 70–99)
Glucose-Capillary: 77 mg/dL (ref 70–99)
Glucose-Capillary: 80 mg/dL (ref 70–99)
Glucose-Capillary: 77 mg/dL (ref 70–99)
Glucose-Capillary: 68 mg/dL — ABNORMAL LOW (ref 70–99)
Glucose-Capillary: 78 mg/dL (ref 70–99)
Glucose-Capillary: 81 mg/dL (ref 70–99)
Glucose-Capillary: 70 mg/dL (ref 70–99)
Glucose-Capillary: 76 mg/dL (ref 70–99)

## 2019-06-27 LAB — SARS CORONAVIRUS 2 BY RT PCR (HOSPITAL ORDER, PERFORMED IN ~~LOC~~ HOSPITAL LAB): SARS Coronavirus 2: NEGATIVE

## 2019-06-27 LAB — TYPE AND SCREEN
ABO/RH(D): A NEG
Antibody Screen: NEGATIVE

## 2019-06-27 MED ORDER — FENTANYL 2.5 MCG/ML W/ROPIVACAINE 0.15% IN NS 100 ML EPIDURAL (ARMC)
EPIDURAL | Status: AC
  Filled 2019-06-27: qty 100

## 2019-06-27 MED ORDER — DEXTROSE-NACL 5-0.45 % IV SOLN
INTRAVENOUS | Status: DC
  Administered 2019-06-27: 1000 mL via INTRAVENOUS

## 2019-06-27 MED ORDER — DEXTROSE 50 % IV SOLN: 0.0000 mL | INTRAVENOUS | Status: DC | PRN

## 2019-06-27 MED ORDER — SODIUM CHLORIDE 0.9 % IV SOLN: INTRAVENOUS | Status: DC

## 2019-06-27 MED ORDER — OXYTOCIN 40 UNITS IN NORMAL SALINE INFUSION - SIMPLE MED: 2.5000 [IU]/h | INTRAVENOUS | Status: DC

## 2019-06-27 MED ORDER — OXYTOCIN BOLUS FROM INFUSION: 500.0000 mL | Freq: Once | INTRAVENOUS | Status: DC

## 2019-06-27 MED ORDER — OXYCODONE-ACETAMINOPHEN 5-325 MG PO TABS: 2.0000 | ORAL_TABLET | ORAL | Status: DC | PRN

## 2019-06-27 MED ORDER — ONDANSETRON HCL 4 MG/2ML IJ SOLN: 4.0000 mg | Freq: Four times a day (QID) | INTRAMUSCULAR | Status: DC | PRN

## 2019-06-27 MED ORDER — EPHEDRINE 5 MG/ML INJ: 10.0000 mg | INTRAVENOUS | Status: DC | PRN

## 2019-06-27 MED ORDER — LIDOCAINE HCL (PF) 1 % IJ SOLN: 30.0000 mL | INTRAMUSCULAR | Status: DC | PRN

## 2019-06-27 MED ORDER — PHENYLEPHRINE 40 MCG/ML (10ML) SYRINGE FOR IV PUSH (FOR BLOOD PRESSURE SUPPORT): 80.0000 ug | PREFILLED_SYRINGE | INTRAVENOUS | Status: DC | PRN

## 2019-06-27 MED ORDER — LACTATED RINGERS IV SOLN
500.0000 mL | INTRAVENOUS | Status: DC | PRN
  Administered 2019-06-28: 500 mL via INTRAVENOUS

## 2019-06-27 MED ORDER — INSULIN REGULAR(HUMAN) IN NACL 100-0.9 UT/100ML-% IV SOLN
INTRAVENOUS | Status: DC
  Administered 2019-06-27: 1.2 [IU]/h via INTRAVENOUS
  Administered 2019-06-28: 0.1 [IU]/h via INTRAVENOUS
  Filled 2019-06-27 (×2): qty 100

## 2019-06-27 MED ORDER — LACTATED RINGERS IV SOLN
500.0000 mL | Freq: Once | INTRAVENOUS | Status: AC
  Administered 2019-06-27: 500 mL via INTRAVENOUS

## 2019-06-27 MED ORDER — OXYCODONE-ACETAMINOPHEN 5-325 MG PO TABS: 1.0000 | ORAL_TABLET | ORAL | Status: DC | PRN

## 2019-06-27 MED ORDER — LIDOCAINE HCL (PF) 1 % IJ SOLN
INTRAMUSCULAR | Status: DC | PRN
  Administered 2019-06-27 (×3): 1 mL

## 2019-06-27 MED ORDER — FENTANYL 2.5 MCG/ML W/ROPIVACAINE 0.15% IN NS 100 ML EPIDURAL (ARMC)
12.0000 mL/h | EPIDURAL | Status: DC
  Administered 2019-06-28 (×2): 12 mL/h via EPIDURAL
  Filled 2019-06-27 (×2): qty 100

## 2019-06-27 MED ORDER — SOD CITRATE-CITRIC ACID 500-334 MG/5ML PO SOLN
30.0000 mL | ORAL | Status: DC | PRN
  Administered 2019-06-28: 30 mL via ORAL
  Filled 2019-06-27: qty 30

## 2019-06-27 MED ORDER — TERBUTALINE SULFATE 1 MG/ML IJ SOLN: 0.2500 mg | Freq: Once | INTRAMUSCULAR | Status: DC | PRN

## 2019-06-27 MED ORDER — MISOPROSTOL 200 MCG PO TABS
ORAL_TABLET | ORAL | Status: AC
  Filled 2019-06-27: qty 4

## 2019-06-27 MED ORDER — PENICILLIN G 3 MILLION UNITS IVPB - SIMPLE MED
3.0000 10*6.[IU] | INTRAVENOUS | Status: DC
  Administered 2019-06-27 – 2019-06-28 (×4): 3 10*6.[IU] via INTRAVENOUS
  Filled 2019-06-27 (×5): qty 100

## 2019-06-27 MED ORDER — FENTANYL 2.5 MCG/ML W/ROPIVACAINE 0.15% IN NS 100 ML EPIDURAL (ARMC)
EPIDURAL | Status: DC | PRN
  Administered 2019-06-27: 12 mL/h via EPIDURAL

## 2019-06-27 MED ORDER — SODIUM CHLORIDE 0.9 % IV SOLN
5.0000 10*6.[IU] | Freq: Once | INTRAVENOUS | Status: AC
  Administered 2019-06-27: 5 10*6.[IU] via INTRAVENOUS
  Filled 2019-06-27: qty 5

## 2019-06-27 MED ORDER — ACETAMINOPHEN 325 MG PO TABS
650.0000 mg | ORAL_TABLET | ORAL | Status: DC | PRN
  Administered 2019-06-28: 650 mg via ORAL
  Filled 2019-06-27: qty 2

## 2019-06-27 MED ORDER — OXYTOCIN 40 UNITS IN NORMAL SALINE INFUSION - SIMPLE MED
1.0000 m[IU]/min | INTRAVENOUS | Status: DC
  Administered 2019-06-27: 2 m[IU]/min via INTRAVENOUS
  Filled 2019-06-27: qty 1000

## 2019-06-27 MED ORDER — LACTATED RINGERS IV SOLN: INTRAVENOUS | Status: DC

## 2019-06-27 MED ORDER — SODIUM CHLORIDE 0.9 % IV SOLN
INTRAVENOUS | Status: DC | PRN
  Administered 2019-06-27 (×3): 5 mL via EPIDURAL

## 2019-06-27 MED ORDER — DIPHENHYDRAMINE HCL 50 MG/ML IJ SOLN: 12.5000 mg | INTRAMUSCULAR | Status: DC | PRN

## 2019-06-27 NOTE — Anesthesia Procedure Notes (Signed)
Epidural Patient location during procedure: OB Start time: 06/27/2019 10:49 PM End time: 06/27/2019 11:11 PM  Staffing Anesthesiologist: Karleen Hampshire, MD Performed: anesthesiologist   Preanesthetic Checklist Completed: patient identified, IV checked, site marked, risks and benefits discussed, surgical consent, monitors and equipment checked, pre-op evaluation and timeout performed  Epidural Patient position: sitting Prep: ChloraPrep Patient monitoring: heart rate, continuous pulse ox and blood pressure Approach: midline Location: L3-L4 Injection technique: LOR saline  Needle:  Needle type: Tuohy  Needle gauge: 18 G Needle length: 9 cm and 9 Needle insertion depth: 7 cm Catheter type: closed end flexible Catheter size: 20 Guage Catheter at skin depth: 11 cm Test dose: negative and Other  Assessment Events: blood not aspirated, injection not painful, no injection resistance, no paresthesia and negative IV test  Additional Notes Risks and benefits of procedure discussed with patient.  Risks including but not limited to infection, spinal/epidural hematoma, nerve injury, post dural puncture headache, and inadequate/failed block.  Patient expressed understanding and consented to epidural placement. Negative dural puncture.  Negative aspiration.  Negative paresthesia on injection.  Dose given in divided aliquots.  Patient tolerated the procedure well with no immediate complications.   Reason for block:procedure for pain

## 2019-06-27 NOTE — Anesthesia Preprocedure Evaluation (Signed)
Anesthesia Evaluation  Patient identified by MRN, date of birth, ID band Patient awake    Reviewed: Allergy & Precautions, H&P , NPO status , Patient's Chart, lab work & pertinent test results  Airway Mallampati: III  TM Distance: >3 FB Neck ROM: full    Dental  (+) Teeth Intact   Pulmonary asthma ,           Cardiovascular Exercise Tolerance: Good negative cardio ROS       Neuro/Psych    GI/Hepatic negative GI ROS,   Endo/Other  diabetes, Gestational  Renal/GU   negative genitourinary   Musculoskeletal   Abdominal   Peds  Hematology negative hematology ROS (+)   Anesthesia Other Findings Past Medical History: No date: Asthma     Comment:  Seasonal No date: First trimester screening No date: Gestational diabetes No date: Hidradenitis suppurativa of right axilla 08/02/2018: Labor and delivery indication for care or intervention 08/02/2018: Labor and delivery, indication for care 08/02/2018: Normal labor and delivery  Past Surgical History: 10/10/2015: INCISION AND DRAINAGE ABSCESS; Right     Comment:  Procedure: Incision and drainage of complex axillary               abscess Excisions of complex hidradenitis on right axilla              3. Excisional debridement of skin subcutaneous tissue;                Surgeon: Leafy Ro, MD;  Location: [facility]               [facility];  Service: General;  Laterality: Right; 2016: WISDOM TOOTH EXTRACTION  BMI    Body Mass Index: 33.07 kg/m      Reproductive/Obstetrics (+) Pregnancy                             Anesthesia Physical Anesthesia Plan  ASA: III  Anesthesia Plan: Epidural   Post-op Pain Management:    Induction:   PONV Risk Score and Plan:   Airway Management Planned:   Additional Equipment:   Intra-op Plan:   Post-operative Plan:   Informed Consent: I have reviewed the patients History and Physical, chart, labs  and discussed the procedure including the risks, benefits and alternatives for the proposed anesthesia with the patient or authorized representative who has indicated his/her understanding and acceptance.       Plan Discussed with: Anesthesiologist  Anesthesia Plan Comments:         Anesthesia Quick Evaluation

## 2019-06-27 NOTE — OB Triage Note (Signed)
Pt presents to unit c/o contractions that began "a few hours ago and I can not tell how often I am having them". Pt rates pain of ctx 10/10. Pt reports positive FM, denies vaginal bleeding or LOF. Vital signs WDL. External monitors applied and assessing. Will continue to monitor.

## 2019-06-27 NOTE — Progress Notes (Signed)
Labor Progress Note  Morgan Collier is a 19 y.o. G2P1001 at [redacted]w[redacted]d by LMP admitted for active labor  Subjective: Pt reports UCs are better now with epidural  Objective: BP 134/88   Pulse 93   Temp 98.3 F (36.8 C) (Oral)   Resp 18   Ht 5\' 1"  (1.549 m)   Wt 79.4 kg   LMP 10/08/2018 (Exact Date)   SpO2 99%   BMI 33.07 kg/m   Fetal Assessment: FHT:  FHR: 125 bpm, variability: moderate,  accelerations:  Present,  decelerations:  Absent Category/reactivity:  Category I UC:   regular, every 4 minutes SVE:    Dilation: 6cm  Effacement: 50%  Station:  -3  Consistency: soft  Position: middle  Membrane status: SROM at 1330 Amniotic color: clear   Assessment / Plan: Spontaneous labor, UCs stopped - Pitocin started 1657  Labor: Labor augmented with Pitocin at 18mU Preeclampsia:  114/72 Fetal Wellbeing:  Category I Pain Control:  Labor support without medications I/D:  GBS pos, 2nd dose of Abx at 1912 Anticipated MOD:  NSVD  12m, CNM 06/27/2019, 11:32 PM

## 2019-06-27 NOTE — H&P (Signed)
OB History & Physical   History of Present Illness:  Chief Complaint:   HPI:  Morgan Collier is a 19 y.o. G85P1001 female at [redacted]w[redacted]d dated by LMP.  She presents to L&D with contractions.  She reports:  -active fetal movement -no leakage of fluid -no vaginal bleeding -onset of contractions at 0200 currently every 4+ minutes  Pregnancy Issues: 1. Rh Negative   Rhogam 04/27/19 2. Varicella Non Immune  Advise vaccination postpartum 3. Hx Gestational Diabetes-->Gestational diabetes  AM insulin Reg 21units, NPH 42 units PM insulin Reg 15 units, NPH 15 units 4. Closely spaced pregnancies  last delivery 07/2018 5. Hx Chlamydia 08/2018  Retest negative 10/2018 6. 1st tri VB  Rhogam given 01/04/2019 7. Asthma  Controlled with Flovent, prn albuterol inhaler 8. Hidradenitis: admitted with presumed sepsis 03/26/19 9. 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 Bacterial Vaginosis in pregnancy   02/23/19 - episode of cramping, treated w/ flagyl     Maternal Medical History:   Past Medical History:  Diagnosis Date  . Asthma    Seasonal  . First trimester screening   . Gestational diabetes   . Hidradenitis suppurativa of right axilla   . Labor and delivery indication for care or intervention 08/02/2018  . Labor and delivery, indication for care 08/02/2018  . Normal labor and delivery 08/02/2018    Past Surgical History:  Procedure Laterality Date  . INCISION AND DRAINAGE ABSCESS Right 10/10/2015   Procedure: Incision and drainage of complex axillary abscess Excisions of complex hidradenitis on right axilla                            3. Excisional debridement of skin subcutaneous tissue;  Surgeon: Jules Husbands, MD;  Location: [facility];  Service: General;  Laterality: Right;  . WISDOM TOOTH EXTRACTION  2016    No Known Allergies  Prior to Admission medications   Medication Sig Start Date End Date Taking? Authorizing Provider  FLOVENT HFA 44 MCG/ACT inhaler SMARTSIG:2  Puff(s) By Mouth Twice Daily 04/30/19  Yes [provider]  fluticasone (FLONASE) 50 MCG/ACT nasal spray Place 1 spray into both nostrils daily as needed.  03/25/18  Yes [provider]  Prenatal Vit-Fe Fumarate-FA (PRENATAL MULTIVITAMIN) TABS tablet Take 1 tablet by mouth daily at 12 noon.   Yes [provider]  cetirizine (ZYRTEC) 10 MG tablet Take 10 mg by mouth daily as needed.  04/07/18 05/24/19  [provider]  chlorhexidine (HIBICLENS) 4 % external liquid Apply topically daily as needed. 03/28/19   McVey, Murray Hodgkins, CNM  PROAIR HFA 108 (90 Base) MCG/ACT inhaler Inhale 2 puffs into the lungs every 6 (six) hours as needed. 10/26/15   [provider]  sertraline (ZOLOFT) 100 MG tablet Take 100 mg by mouth daily.    [provider]  valACYclovir (VALTREX) 1000 MG tablet Take 1,000 mg by mouth 2 (two) times daily. PRN for onset symptoms    [provider]     Prenatal care site: [place] History: She  reports that she has never smoked. She has never used smokeless tobacco. She reports previous alcohol use. She reports that she does not use drugs.  Family History: family history includes ADD / ADHD in her father; Diabetes in her paternal grandmother; Heart disease in her maternal grandmother; Hypertension in her maternal grandmother and paternal grandmother; Throat cancer in her maternal uncle.   Review  of Systems: A full review of systems was performed and negative except as noted in the HPI.    Physical Exam:  Vital Signs: BP (!) 127/91 (BP Location: Right Arm)   Pulse (!) 104   Temp 98.3 F (36.8 C) (Oral)   Resp 18   Ht 5\' 1"  (1.549 m)   Wt 79.4 kg   LMP 10/08/2018 (Exact Date)   BMI 33.07 kg/m   General:   alert and cooperative  Skin:  normal  Neurologic:    Alert & oriented x 3  Lungs:   nl effort  Heart:   regular rate and rhythm  Abdomen:  soft between UCs, gravid  Extremities: : non-tender,  symmetric    Pelvic exam: No lesions   EFW: 06/01/19 EFW: 2308g 5lb1oz 49%  No results found for this or any previous visit (from the past 24 hour(s)).  Pertinent Results:  Prenatal Labs: Blood type/Rh A neg  Antibody screen neg  Rubella Immune  Varicella Non-Immune  RPR NR  HBsAg Neg  HIV NR  GC neg  Chlamydia neg  Genetic screening negative  1 hour GTT 215  3 hour GTT n/a  GBS pos   FHT: FHR: 140 bpm, variability: moderate,  accelerations:  Present,  decelerations:  Absent Category/reactivity:  Category I TOCO: regular, every 4 minutes SVE: Dilation: 5 / Effacement (%): 50 / Station: -3    Cephalic by SVE     Assessment:  Morgan Collier is a 19 y.o. G44P1001 female at [redacted]w[redacted]d with Labor.   Plan:  1. Admit to Labor & Delivery; consents reviewed and obtained  2. Fetal Well being  - Fetal Tracing: Cat I - GBS pos - Presentation: vtx confirmed by SVE   3. Routine OB: - Prenatal labs reviewed, as above - Rh neg - CBC & T&S on admit - Clear fluids, IVF  4. Monitoring of Labor -  Contractions external toco in place -  Plan for continuous fetal monitoring  -  Maternal pain control as desired: IVPM, regional anesthesia - Anticipate vaginal delivery  5. Post Partum Planning: - Infant feeding: Both, mostly formula - Contraception: not interested in Flu in season - did not get this season  Tdap at 27-36 weeks - given 04/27/19   04/29/19, CNM 06/27/2019 12:45 PM

## 2019-06-27 NOTE — Progress Notes (Signed)
Labor Progress Note  Morgan Collier is a 19 y.o. G2P1001 at [redacted]w[redacted]d by LMP admitted for active labor  Subjective: Pt is comfortable, not really feeling any UCs  Objective: BP 114/72 (BP Location: Left Arm)   Pulse 89   Temp 98.3 F (36.8 C) (Oral)   Resp 18   Ht 5\' 1"  (1.549 m)   Wt 79.4 kg   LMP 10/08/2018 (Exact Date)   BMI 33.07 kg/m   Fetal Assessment: FHT:  FHR: 120 bpm, variability: moderate,  accelerations:  Present,  decelerations:  Absent Category/reactivity:  Category I UC:   regular, every 4-5 minutes SVE:    Dilation: 6cm  Effacement: 50%  Station:  -3  Consistency: soft  Position: middle  Membrane status: SROM at 1330 Amniotic color: clear  Labs: Lab Results  Component Value Date   WBC 10.4 06/27/2019   HGB 11.1 (L) 06/27/2019   HCT 34.6 (L) 06/27/2019   MCV 67.2 (L) 06/27/2019   PLT 254 06/27/2019    Assessment / Plan: Spontaneous labor, UCs stopped - Pitocin started 1657  Labor: Labor augmented with Pitocin Preeclampsia:  114/72 Fetal Wellbeing:  Category I Pain Control:  Labor support without medications I/D:  GBS pos, 2nd dose of Abx at 1912 Anticipated MOD:  NSVD  06/29/2019, CNM 06/27/2019, 9:03 PM

## 2019-06-28 ENCOUNTER — Encounter: Payer: Self-pay | Admitting: Obstetrics and Gynecology

## 2019-06-28 ENCOUNTER — Encounter
Admission: EM | Disposition: A | Discharge status: Home / Self Care | Payer: Self-pay | Source: Home / Self Care | Attending: Certified Nurse Midwife

## 2019-06-28 LAB — GLUCOSE, CAPILLARY
Glucose-Capillary: 102 mg/dL — ABNORMAL HIGH (ref 70–99)
Glucose-Capillary: 103 mg/dL — ABNORMAL HIGH (ref 70–99)
Glucose-Capillary: 72 mg/dL (ref 70–99)
Glucose-Capillary: 77 mg/dL (ref 70–99)
Glucose-Capillary: 79 mg/dL (ref 70–99)
Glucose-Capillary: 79 mg/dL (ref 70–99)
Glucose-Capillary: 79 mg/dL (ref 70–99)
Glucose-Capillary: 80 mg/dL (ref 70–99)
Glucose-Capillary: 81 mg/dL (ref 70–99)
Glucose-Capillary: 87 mg/dL (ref 70–99)
Glucose-Capillary: 88 mg/dL (ref 70–99)
Glucose-Capillary: 90 mg/dL (ref 70–99)
Glucose-Capillary: 95 mg/dL (ref 70–99)
Glucose-Capillary: 98 mg/dL (ref 70–99)

## 2019-06-28 LAB — RPR: RPR Ser Ql: NONREACTIVE

## 2019-06-28 SURGERY — Surgical Case
Anesthesia: Epidural

## 2019-06-28 MED ORDER — OXYTOCIN 10 UNIT/ML IJ SOLN
INTRAMUSCULAR | Status: DC | PRN
  Administered 2019-06-28: 3 [IU] via INTRAMUSCULAR

## 2019-06-28 MED ORDER — FLUTICASONE PROPIONATE 50 MCG/ACT NA SUSP
1.0000 | Freq: Every day | NASAL | Status: DC | PRN
  Filled 2019-06-28: qty 16

## 2019-06-28 MED ORDER — ONDANSETRON HCL 4 MG/2ML IJ SOLN
INTRAMUSCULAR | Status: DC | PRN
  Administered 2019-06-28: 4 mg via INTRAVENOUS

## 2019-06-28 MED ORDER — SODIUM CHLORIDE (PF) 0.9 % IJ SOLN
INTRAMUSCULAR | Status: AC
  Filled 2019-06-28: qty 50

## 2019-06-28 MED ORDER — HEMOSTATIC AGENTS (NO CHARGE) OPTIME
TOPICAL | Status: DC | PRN
Start: 2019-06-28 — End: 2019-06-28
  Administered 2019-06-28: 1 via TOPICAL

## 2019-06-28 MED ORDER — OXYTOCIN-SODIUM CHLORIDE 30-0.9 UT/500ML-% IV SOLN
INTRAVENOUS | Status: AC
  Filled 2019-06-28: qty 500

## 2019-06-28 MED ORDER — PENICILLIN G POT IN DEXTROSE 60000 UNIT/ML IV SOLN
3.0000 10*6.[IU] | INTRAVENOUS | Status: DC
  Filled 2019-06-28 (×4): qty 50

## 2019-06-28 MED ORDER — FENTANYL CITRATE (PF) 100 MCG/2ML IJ SOLN
INTRAMUSCULAR | Status: AC
  Filled 2019-06-28: qty 2

## 2019-06-28 MED ORDER — BUDESONIDE 0.25 MG/2ML IN SUSP
0.2500 mg | Freq: Two times a day (BID) | RESPIRATORY_TRACT | Status: DC | PRN
  Filled 2019-06-28: qty 2

## 2019-06-28 MED ORDER — DIBUCAINE (PERIANAL) 1 % EX OINT: 1.0000 "application " | TOPICAL_OINTMENT | CUTANEOUS | Status: DC | PRN

## 2019-06-28 MED ORDER — SODIUM CHLORIDE 0.9 % IV SOLN
INTRAVENOUS | Status: DC | PRN
  Administered 2019-06-28: 20 ug/min via INTRAVENOUS

## 2019-06-28 MED ORDER — OXYCODONE HCL 5 MG PO TABS
10.0000 mg | ORAL_TABLET | ORAL | Status: DC | PRN
  Administered 2019-06-29 – 2019-06-30 (×5): 10 mg via ORAL
  Filled 2019-06-28 (×5): qty 2

## 2019-06-28 MED ORDER — KETOROLAC TROMETHAMINE 30 MG/ML IJ SOLN
INTRAMUSCULAR | Status: AC
  Filled 2019-06-28: qty 1

## 2019-06-28 MED ORDER — NALBUPHINE HCL 10 MG/ML IJ SOLN: 5.0000 mg | Freq: Once | INTRAMUSCULAR | Status: DC | PRN

## 2019-06-28 MED ORDER — SODIUM CHLORIDE 0.9% FLUSH: 3.0000 mL | INTRAVENOUS | Status: DC | PRN

## 2019-06-28 MED ORDER — MEASLES, MUMPS & RUBELLA VAC IJ SOLR: 0.5000 mL | Freq: Once | INTRAMUSCULAR | Status: DC

## 2019-06-28 MED ORDER — DIPHENHYDRAMINE HCL 50 MG/ML IJ SOLN: 12.5000 mg | INTRAMUSCULAR | Status: DC | PRN

## 2019-06-28 MED ORDER — TETANUS-DIPHTH-ACELL PERTUSSIS 5-2.5-18.5 LF-MCG/0.5 IM SUSP: 0.5000 mL | Freq: Once | INTRAMUSCULAR | Status: DC

## 2019-06-28 MED ORDER — NALBUPHINE HCL 10 MG/ML IJ SOLN: 5.0000 mg | INTRAMUSCULAR | Status: DC | PRN

## 2019-06-28 MED ORDER — LIDOCAINE HCL 2 % IJ SOLN
INTRAMUSCULAR | Status: AC
  Filled 2019-06-28: qty 10

## 2019-06-28 MED ORDER — OXYCODONE HCL 5 MG PO TABS
5.0000 mg | ORAL_TABLET | ORAL | Status: DC | PRN
  Administered 2019-06-29 (×2): 5 mg via ORAL
  Filled 2019-06-28 (×2): qty 1

## 2019-06-28 MED ORDER — ALBUTEROL SULFATE (2.5 MG/3ML) 0.083% IN NEBU: 3.0000 mL | INHALATION_SOLUTION | Freq: Four times a day (QID) | RESPIRATORY_TRACT | Status: DC | PRN

## 2019-06-28 MED ORDER — KETOROLAC TROMETHAMINE 30 MG/ML IJ SOLN
INTRAMUSCULAR | Status: DC | PRN
  Administered 2019-06-28: 30 mg via INTRAVENOUS

## 2019-06-28 MED ORDER — FENTANYL CITRATE (PF) 100 MCG/2ML IJ SOLN: INTRAMUSCULAR | Status: DC | PRN

## 2019-06-28 MED ORDER — VARICELLA VIRUS VACCINE LIVE 1350 PFU/0.5ML IJ SUSR
0.5000 mL | Freq: Once | INTRAMUSCULAR | Status: DC
  Filled 2019-06-28: qty 0.5

## 2019-06-28 MED ORDER — SENNOSIDES-DOCUSATE SODIUM 8.6-50 MG PO TABS
2.0000 | ORAL_TABLET | ORAL | Status: DC
  Administered 2019-06-29 – 2019-06-30 (×2): 2 via ORAL
  Filled 2019-06-28 (×2): qty 2

## 2019-06-28 MED ORDER — FENTANYL CITRATE (PF) 100 MCG/2ML IJ SOLN
INTRAMUSCULAR | Status: DC | PRN
  Administered 2019-06-28: 100 ug via EPIDURAL

## 2019-06-28 MED ORDER — COCONUT OIL OIL
1.0000 "application " | TOPICAL_OIL | Status: DC | PRN
  Administered 2019-06-30: 1 via TOPICAL
  Filled 2019-06-28: qty 120

## 2019-06-28 MED ORDER — BUPIVACAINE HCL (PF) 0.5 % IJ SOLN
INTRAMUSCULAR | Status: AC
  Filled 2019-06-28: qty 30

## 2019-06-28 MED ORDER — FLEET ENEMA 7-19 GM/118ML RE ENEM: 1.0000 | ENEMA | Freq: Every day | RECTAL | Status: DC | PRN

## 2019-06-28 MED ORDER — ROCURONIUM BROMIDE 10 MG/ML (PF) SYRINGE
PREFILLED_SYRINGE | INTRAVENOUS | Status: AC
  Filled 2019-06-28: qty 10

## 2019-06-28 MED ORDER — SOD CITRATE-CITRIC ACID 500-334 MG/5ML PO SOLN: 30.0000 mL | ORAL | Status: DC

## 2019-06-28 MED ORDER — KETOROLAC TROMETHAMINE 30 MG/ML IJ SOLN: 30.0000 mg | Freq: Four times a day (QID) | INTRAMUSCULAR | Status: DC

## 2019-06-28 MED ORDER — MORPHINE SULFATE (PF) 0.5 MG/ML IJ SOLN
INTRAMUSCULAR | Status: DC | PRN
  Administered 2019-06-28: 3 mg via EPIDURAL

## 2019-06-28 MED ORDER — SIMETHICONE 80 MG PO CHEW
80.0000 mg | CHEWABLE_TABLET | Freq: Three times a day (TID) | ORAL | Status: DC
  Administered 2019-06-29 – 2019-06-30 (×5): 80 mg via ORAL
  Filled 2019-06-28 (×5): qty 1

## 2019-06-28 MED ORDER — ACETAMINOPHEN 325 MG PO TABS: 650.0000 mg | ORAL_TABLET | Freq: Four times a day (QID) | ORAL | Status: DC

## 2019-06-28 MED ORDER — OXYTOCIN-SODIUM CHLORIDE 30-0.9 UT/500ML-% IV SOLN
INTRAVENOUS | Status: DC | PRN
  Administered 2019-06-28: 500 mL/h via INTRAVENOUS

## 2019-06-28 MED ORDER — METHYLERGONOVINE MALEATE 0.2 MG/ML IJ SOLN
INTRAMUSCULAR | Status: AC
  Filled 2019-06-28: qty 1

## 2019-06-28 MED ORDER — ONDANSETRON HCL 4 MG/2ML IJ SOLN: 4.0000 mg | Freq: Three times a day (TID) | INTRAMUSCULAR | Status: DC | PRN

## 2019-06-28 MED ORDER — OXYTOCIN 40 UNITS IN NORMAL SALINE INFUSION - SIMPLE MED: 1.0000 m[IU]/min | INTRAVENOUS | Status: DC

## 2019-06-28 MED ORDER — EPHEDRINE 5 MG/ML INJ
INTRAVENOUS | Status: AC
  Filled 2019-06-28: qty 10

## 2019-06-28 MED ORDER — SODIUM CHLORIDE FLUSH 0.9 % IV SOLN
INTRAVENOUS | Status: DC | PRN
  Administered 2019-06-28: 100 mL

## 2019-06-28 MED ORDER — MENTHOL 3 MG MT LOZG
1.0000 | LOZENGE | OROMUCOSAL | Status: DC | PRN
  Filled 2019-06-28: qty 9

## 2019-06-28 MED ORDER — PROPOFOL 10 MG/ML IV BOLUS
INTRAVENOUS | Status: AC
  Filled 2019-06-28: qty 20

## 2019-06-28 MED ORDER — BISACODYL 10 MG RE SUPP: 10.0000 mg | Freq: Every day | RECTAL | Status: DC | PRN

## 2019-06-28 MED ORDER — MORPHINE SULFATE (PF) 0.5 MG/ML IJ SOLN
INTRAMUSCULAR | Status: AC
  Filled 2019-06-28: qty 10

## 2019-06-28 MED ORDER — DIPHENHYDRAMINE HCL 25 MG PO CAPS: 25.0000 mg | ORAL_CAPSULE | ORAL | Status: DC | PRN

## 2019-06-28 MED ORDER — LIDOCAINE HCL (PF) 2 % IJ SOLN
INTRAMUSCULAR | Status: DC | PRN
  Administered 2019-06-28 (×4): 5 mg via EPIDURAL

## 2019-06-28 MED ORDER — SODIUM CHLORIDE 0.9 % IV SOLN
500.0000 mg | INTRAVENOUS | Status: DC
  Administered 2019-06-28: 500 mg via INTRAVENOUS
  Filled 2019-06-28 (×2): qty 500

## 2019-06-28 MED ORDER — CEFAZOLIN SODIUM-DEXTROSE 2-4 GM/100ML-% IV SOLN
2.0000 g | Freq: Once | INTRAVENOUS | Status: AC
  Administered 2019-06-28: 2 g via INTRAVENOUS
  Filled 2019-06-28: qty 100

## 2019-06-28 MED ORDER — BUPIVACAINE LIPOSOME 1.3 % IJ SUSP
INTRAMUSCULAR | Status: AC
  Filled 2019-06-28: qty 20

## 2019-06-28 MED ORDER — NALOXONE HCL 0.4 MG/ML IJ SOLN: 0.4000 mg | INTRAMUSCULAR | Status: DC | PRN

## 2019-06-28 MED ORDER — GABAPENTIN 300 MG PO CAPS
300.0000 mg | ORAL_CAPSULE | Freq: Every day | ORAL | Status: DC
  Administered 2019-06-28 – 2019-06-29 (×2): 300 mg via ORAL
  Filled 2019-06-28 (×2): qty 1

## 2019-06-28 MED ORDER — SIMETHICONE 80 MG PO CHEW: 80.0000 mg | CHEWABLE_TABLET | ORAL | Status: DC

## 2019-06-28 MED ORDER — OXYCODONE HCL 5 MG PO TABS: 5.0000 mg | ORAL_TABLET | ORAL | Status: DC | PRN

## 2019-06-28 MED ORDER — IBUPROFEN 800 MG PO TABS
800.0000 mg | ORAL_TABLET | Freq: Four times a day (QID) | ORAL | Status: DC
  Administered 2019-06-29 – 2019-06-30 (×4): 800 mg via ORAL
  Filled 2019-06-28 (×4): qty 1

## 2019-06-28 MED ORDER — WITCH HAZEL-GLYCERIN EX PADS: 1.0000 "application " | MEDICATED_PAD | CUTANEOUS | Status: DC | PRN

## 2019-06-28 MED ORDER — PROPRANOLOL HCL 1 MG/ML IV SOLN
2.0000 mg | Freq: Once | INTRAVENOUS | Status: DC
  Filled 2019-06-28: qty 2

## 2019-06-28 MED ORDER — FERROUS SULFATE 325 (65 FE) MG PO TABS
325.0000 mg | ORAL_TABLET | Freq: Two times a day (BID) | ORAL | Status: DC
  Administered 2019-06-29 – 2019-06-30 (×3): 325 mg via ORAL
  Filled 2019-06-28 (×3): qty 1

## 2019-06-28 MED ORDER — ACETAMINOPHEN 500 MG PO TABS
1000.0000 mg | ORAL_TABLET | Freq: Four times a day (QID) | ORAL | Status: DC
  Administered 2019-06-28 – 2019-06-30 (×7): 1000 mg via ORAL
  Filled 2019-06-28 (×7): qty 2

## 2019-06-28 MED ORDER — OXYTOCIN-SODIUM CHLORIDE 30-0.9 UT/500ML-% IV SOLN
2.5000 [IU]/h | INTRAVENOUS | Status: AC
  Administered 2019-06-28: 2.5 [IU]/h via INTRAVENOUS
  Filled 2019-06-28: qty 500

## 2019-06-28 MED ORDER — SERTRALINE HCL 100 MG PO TABS
100.0000 mg | ORAL_TABLET | Freq: Every day | ORAL | Status: DC
  Filled 2019-06-28 (×2): qty 1

## 2019-06-28 MED ORDER — SIMETHICONE 80 MG PO CHEW
80.0000 mg | CHEWABLE_TABLET | ORAL | Status: DC | PRN
  Administered 2019-06-28: 80 mg via ORAL
  Filled 2019-06-28: qty 1

## 2019-06-28 MED ORDER — PRENATAL MULTIVITAMIN CH: 1.0000 | ORAL_TABLET | Freq: Every day | ORAL | Status: DC

## 2019-06-28 MED ORDER — KETOROLAC TROMETHAMINE 30 MG/ML IJ SOLN: 30.0000 mg | Freq: Four times a day (QID) | INTRAMUSCULAR | Status: AC

## 2019-06-28 MED ORDER — NALOXONE HCL 4 MG/10ML IJ SOLN
1.0000 ug/kg/h | INTRAVENOUS | Status: DC | PRN
  Filled 2019-06-28: qty 5

## 2019-06-28 MED ORDER — DIPHENHYDRAMINE HCL 25 MG PO CAPS: 25.0000 mg | ORAL_CAPSULE | Freq: Four times a day (QID) | ORAL | Status: DC | PRN

## 2019-06-28 MED ORDER — LACTATED RINGERS IV SOLN: INTRAVENOUS | Status: DC

## 2019-06-28 SURGICAL SUPPLY — 32 items
BARRIER ADHS 3X4 INTERCEED (GAUZE/BANDAGES/DRESSINGS) ×2 IMPLANT
CANISTER SUCT 3000ML PPV (MISCELLANEOUS) ×3 IMPLANT
CHLORAPREP W/TINT 26 (MISCELLANEOUS) ×3 IMPLANT
COVER WAND RF STERILE (DRAPES) ×3 IMPLANT
DRSG TEGADERM 4X4.75 (GAUZE/BANDAGES/DRESSINGS) ×2 IMPLANT
DRSG TELFA 3X8 NADH (GAUZE/BANDAGES/DRESSINGS) ×3 IMPLANT
ELECT REM PT RETURN 9FT ADLT (ELECTROSURGICAL) ×3
ELECTRODE REM PT RTRN 9FT ADLT (ELECTROSURGICAL) ×1 IMPLANT
EXTRACTOR VACUUM KIWI (MISCELLANEOUS) ×2 IMPLANT
GAUZE SPONGE 4X4 12PLY STRL (GAUZE/BANDAGES/DRESSINGS) ×3 IMPLANT
GOWN STRL REUS W/ TWL LRG LVL3 (GOWN DISPOSABLE) ×3 IMPLANT
GOWN STRL REUS W/TWL LRG LVL3 (GOWN DISPOSABLE) ×6
NDL HYPO 25GX1X1/2 BEV (NEEDLE) ×1 IMPLANT
NEEDLE HYPO 25GX1X1/2 BEV (NEEDLE) ×3 IMPLANT
NS IRRIG 1000ML POUR BTL (IV SOLUTION) ×3 IMPLANT
PACK C SECTION (MISCELLANEOUS) ×3 IMPLANT
PAD DRESSING TELFA 3X8 NADH (GAUZE/BANDAGES/DRESSINGS) ×1 IMPLANT
PAD OB MATERNITY 4.3X12.25 (PERSONAL CARE ITEMS) ×3 IMPLANT
PAD PREP 24X41 OB/GYN DISP (PERSONAL CARE ITEMS) ×3 IMPLANT
PENCIL SMOKE ULTRAEVAC 22 CON (MISCELLANEOUS) ×3 IMPLANT
SPONGE LAP 18X18 RF (DISPOSABLE) ×2 IMPLANT
STAPLER INSORB 30 2030 C-SECTI (MISCELLANEOUS) ×2 IMPLANT
SUT MNCRL 4-0 (SUTURE) ×2
SUT MNCRL 4-0 27XMFL (SUTURE) ×1
SUT VIC AB 0 CT1 36 (SUTURE) ×6 IMPLANT
SUT VIC AB 0 CTX 36 (SUTURE) ×4
SUT VIC AB 0 CTX36XBRD ANBCTRL (SUTURE) ×2 IMPLANT
SUT VIC AB 2-0 SH 27 (SUTURE) ×4
SUT VIC AB 2-0 SH 27XBRD (SUTURE) ×2 IMPLANT
SUTURE MNCRL 4-0 27XMF (SUTURE) ×1 IMPLANT
SYR 30ML LL (SYRINGE) ×6 IMPLANT
TAPE PAPER 2X10 WHT MICROPORE (GAUZE/BANDAGES/DRESSINGS) ×2 IMPLANT

## 2019-06-28 NOTE — Discharge Summary (Signed)
Obstetrical Discharge Summary  Patient Name: Morgan Collier DOB: [birth year] MRN: [id number]  Date of Admission: 06/27/2019 Date of Discharge: 06/30/2019  [facility]: [facility]  Gestational Age at Delivery: [redacted]w[redacted]d   Antepartum complications:  Pregnancy Issues: 1. Rh Negative   Rhogam 04/27/19 2. Varicella Non Immune  Advise vaccination postpartum 3. Hx Gestational Diabetes-->Gestational diabetes  AM insulin Reg 21units, NPH 42 units PM insulin Reg 15 units, NPH 15 units 4. Closely spaced pregnancies  last delivery 07/2018 5. Hx Chlamydia 08/2018  Retest negative 10/2018 6. 1st tri VB  Rhogam given 01/04/2019 7. Asthma  Controlled with Flovent, prn albuterol inhaler 8. Hidradenitis: admitted with presumed sepsis 03/26/19 9. 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 Bacterial Vaginosis in pregnancy   02/23/19 - episode of cramping, treated w/ flagyl    Admitting Diagnosis: spontaneous labor  Secondary Diagnosis: Patient Active Problem List   Diagnosis Date Noted  . Normal labor 06/27/2019  . Pregnancy 06/20/2019  . Decreased fetal movement 05/24/2019  . Uterine contractions during pregnancy 05/24/2019  . Preterm uterine contractions, antepartum, third trimester 05/10/2019  . Sepsis (HCC) 03/27/2019  . Hypokalemia 03/27/2019  . Asthma 03/27/2019  . Fever 03/26/2019  . Supervision of normal pregnancy 11/26/2018  . Hidradenitis suppurativa of right axilla 10/28/2016    Augmentation: Pitocin Complications: ROM>24 hours Intrapartum complications/course:   Failure to progress pLTCS 06/28/19  Date of Delivery: 06/28/19 Delivered By: Morgan Collier   Delivery Type: primary cesarean section, low transverse incision Anesthesia: epidural Placenta: extracted Newborn Data: Live born girl "Na'Zayla" Birth Weight:   APGAR: ,   Newborn Delivery   Birth date/time:  Delivery type: C-Section, Low Transverse Trial of labor: Yes C-section categorization:  Primary       Brief Hospital Course  Postpartum Course: Morgan Collier is a G2P1001 who underwent cesarean section on 06/27/2019 - 06/28/2019.  Patient had an uncomplicated surgery; for further details of this surgery, please refer to the operative note.  Patient had an uncomplicated postpartum course.  By time of discharge on POD#2, her pain was controlled on oral pain medications; she had appropriate lochia and was ambulating, voiding without difficulty, tolerating regular diet and passing flatus.   She was deemed stable for discharge to home.     Discharge Physical Exam:  BP 125/72   Pulse 68   Temp 98.9 F (37.2 C) (Oral)   Resp 18   Ht 5\' 1"  (1.549 m)   Wt 79.4 kg   LMP 10/08/2018 (Exact Date)   SpO2 99%   Breastfeeding Unknown   BMI 33.07 kg/m   General: NAD CV: RRR Pulm: CTABL, nl effort ABD: s/nd/nt, fundus firm and below the umbilicus Lochia: moderate Incision: c/d/i  DVT Evaluation: LE non-ttp, no evidence of DVT on exam.  Hemoglobin  Date Value Ref Range Status  06/29/2019 8.2 (L) 12.0 - 15.0 g/dL Final    Comment:    Reticulocyte Hemoglobin testing may be clinically indicated, consider ordering this additional test 08/29/2019    HGB  Date Value Ref Range Status  11/30/2013 12.8 12.0 - 16.0 g/dL Final   HCT  Date Value Ref Range Status  06/29/2019 26.3 (L) 36.0 - 46.0 % Final  11/30/2013 41.2 35.0 - 47.0 % Final   Postpartum Procedures: None Edinburgh:  Edinburgh Postnatal Depression Scale Screening Tool 06/29/2019 08/04/2018 08/03/2018  I have been able to laugh and see the funny side of things. (No Data) 0 (No Data)  I have looked forward with enjoyment  to things. - 0 -  I have blamed myself unnecessarily when things went wrong. - 0 -  I have been anxious or worried for no good reason. - 1 -  I have felt scared or panicky for no good reason. - 1 -  Things have been getting on top of me. - 1 -  I have been so unhappy that I have had difficulty sleeping. - 0  -  I have felt sad or miserable. - 1 -  I have been so unhappy that I have been crying. - 0 -  The thought of harming myself has occurred to me. - 0 -  Edinburgh Postnatal Depression Scale Total - 4 -    Disposition: stable, discharge to home. Baby Feeding: breastmilk Baby Disposition: home with mom  Rh Immune globulin given: A neg  Rubella vaccine given: Immune  Flu vaccine given in AP or PP setting: none Tdap vaccine given in AP or PP setting: 04/27/19   Contraception: undecided - considering contraceptive patch   Prenatal Labs: Blood type/Rh --/--/A NEG (05/31 1307)  Antibody screen neg  Rubella Immune  Varicella Immune  RPR NR  HBsAg Neg  HIV NR  GC neg  Chlamydia neg  Genetic screening negative  1 hour GTT 139, 215  3 hour GTT gDMA2  GBS positive     Plan:  Morgan Collier was discharged to home in good condition. Follow-up appointment at [place] with delivering provider in 1-2 weeks   Discharge Medications: Allergies as of 06/30/2019   No Known Allergies     Medication List    STOP taking these medications   sertraline 100 MG tablet Commonly known as: ZOLOFT     TAKE these medications   acetaminophen 500 MG tablet Commonly known as: TYLENOL Take 2 tablets (1,000 mg total) by mouth every 6 (six) hours.   cetirizine 10 MG tablet Commonly known as: ZYRTEC Take 10 mg by mouth daily as needed.   chlorhexidine 4 % external liquid Commonly known as: Hibiclens Apply topically daily as needed.   Flovent HFA 44 MCG/ACT inhaler Generic drug: fluticasone SMARTSIG:2 Puff(s) By Mouth Twice Daily   fluticasone 50 MCG/ACT nasal spray Commonly known as: FLONASE Place 1 spray into both nostrils daily as needed.   ibuprofen 800 MG tablet Commonly known as: ADVIL Take 1 tablet (800 mg total) by mouth every 6 (six) hours.   oxyCODONE 5 MG immediate release tablet Commonly known as: Oxy IR/ROXICODONE Take 1 tablet (5 mg total) by mouth every  4 (four) hours as needed for up to 7 days for moderate pain.   prenatal multivitamin Tabs tablet Take 1 tablet by mouth daily at 12 noon.   ProAir HFA 108 (90 Base) MCG/ACT inhaler Generic drug: albuterol Inhale 2 puffs into the lungs every 6 (six) hours as needed.   senna-docusate 8.6-50 MG tablet Commonly known as: Senokot-S Take 2 tablets by mouth daily. Start taking on: July 01, 2019   valACYclovir 1000 MG tablet Commonly known as: VALTREX Take 1,000 mg by mouth 2 (two) times daily. PRN for onset symptoms       Follow-up Information    Benjaman Kindler, MD Follow up in 1 week(s).   Specialty: Obstetrics and Gynecology Why: post-op check  Contact information: [place] [address] [phone]           Signed:  Drinda Butts, CNM Certified Nurse Midwife [facility]

## 2019-06-28 NOTE — Progress Notes (Signed)
Labor Progress Note  Morgan Collier is a 19 y.o. G2P1001 at [redacted]w[redacted]d by LMP admitted for active labor  Subjective: Pt resting.  Objective: BP 114/71   Pulse 80   Temp 97.9 F (36.6 C) (Oral)   Resp 18   Ht 5\' 1"  (1.549 m)   Wt 79.4 kg   LMP 10/08/2018 (Exact Date)   SpO2 98%   BMI 33.07 kg/m   Fetal Assessment: FHT:  FHR: 125 bpm, variability: moderate,  accelerations:  Present,  decelerations:  Absent Category/reactivity:  Category I UC:   regular, every 3 minutes SVE:    Dilation: 6cm  Effacement: 50%  Station:  -1  Consistency: soft  Position: middle  Membrane status: SROM at 1330 Amniotic color: clear   Assessment / Plan: Spontaneous labor, UCs stopped - IUPC placed, well tolerated  Labor: Labor augmented with Pitocin at 54mU, MVUs at 180 Plan: turn off pitocin and restart in 30 min. Preeclampsia:  114/71 Fetal Wellbeing:  Category I Pain Control:  Epidural I/D:  GBS pos, 2nd dose of Abx at 1912 Anticipated MOD:  NSVD  30m, CNM 06/28/2019, 3:12 AM

## 2019-06-28 NOTE — Progress Notes (Signed)
Walters maneuver performed with 5 contractions. Patient tolerated well.

## 2019-06-28 NOTE — Progress Notes (Signed)
Labor Progress Note  Assuming care from [facility].  Morgan Collier is a 19 y.o. G2P1001 at [redacted]w[redacted]d by LMP c/w [redacted]w[redacted]d admitted for labor.  Subjective:  Feeling comfortable with epidural. Ready for baby to come.   Objective: BP 109/77 (BP Location: Left Arm)   Pulse (!) 106   Temp (!) 97.4 F (36.3 C) (Oral)   Resp 15   Ht 5\' 1"  (1.549 m)   Wt 79.4 kg   LMP 10/08/2018 (Exact Date)   SpO2 100%   BMI 33.07 kg/m   Fetal Assessment: FHT:  FHR: 130 bpm, variability: moderate,  accelerations:  Present,  decelerations:  Absent Category/reactivity:  Category I UC:   regular, every 3-5 minutes SVE:   Per CNM [place] at 0708 Dilation: 7cm  Effacement: 50%  Station:  -1  Consistency: soft  Position: middle  Membrane status: SROM 1330 06/27/2019 Amniotic color: clear  Labs: Lab Results  Component Value Date   WBC 10.4 06/27/2019   HGB 11.1 (L) 06/27/2019   HCT 34.6 (L) 06/27/2019   MCV 67.2 (L) 06/27/2019   PLT 254 06/27/2019    Assessment / Plan: Augmentation of labor, protracted active phase  Labor: Protracted active phase. Pitocin infusing at 3mu/min, with inadequate MVUs; continue to titrate per protocl to adequate MVUs. Fetal Wellbeing:  Category I Pain Control:  Epidural I/D:  S/p penicillin x 4 doses for GBS prophylaxis Anticipated MOD:  NSVD  Patient presented in spontaneous early labor. She changed her cervix from 1cm to 6cm without augmentation and with SROM. Pitocin started at 1657 yesterday and titrated up to 49mu/min. It was stopped at 0320. It was restarted at 0350 with IUPC placed. Currently infusing at 34mu/min with inadequate MVUs. Plan to continue to titrate per high dose protocol to adequate MVUs. Continue regular repositioning.   13m, CNM 06/28/2019, 7:42 AM

## 2019-06-28 NOTE — Progress Notes (Signed)
Emergency bell called to patient's bedside just after she arrived from the operating room. Nurse report of unresponsive patient, staring, sat straight up, no response to sternal rub, +blinking.  Today's Vitals   06/28/19 1320 06/28/19 1325 06/28/19 1329 06/28/19 1524  BP:    126/88  Pulse:    (!) 118  Resp:    (!) 22  Temp:    97.7 F (36.5 C)  TempSrc:    Oral  SpO2: 100% 99% 99% 99%  Weight:      Height:      PainSc:       Her HR elevated to max of 148, but BP wnl throughout. Sugar 103 by fingerstick. O2 sats normal but non-rebreather placed. Code called and team arrived. Pt became responsive, lungs clear. No obvious etiology. Fundus firm, bleeding dark maroon with expected amount. Foley still in place with clear urine.  Will continue to monitor.

## 2019-06-28 NOTE — Progress Notes (Signed)
Labor Progress Note  Morgan Collier is a 19 y.o. G2P1001 at [redacted]w[redacted]d by LMP admitted for active labor  Subjective: Pt resting with good pain management   Objective: BP 111/66    Pulse 74    Temp 98 F (36.7 C) (Oral)    Resp 18    Ht 5\' 1"  (1.549 m)    Wt 79.4 kg    LMP 10/08/2018 (Exact Date)    SpO2 97%    BMI 33.07 kg/m   Fetal Assessment: FHT:  FHR: 125 bpm, variability: moderate,  accelerations:  Present,  decelerations:  Absent Category/reactivity:  Category I UC:   regular, every 4 minutes SVE:    Dilation: 7cm   Effacement: 50%  Station:  -1  Consistency: soft  Position: middle  Membrane status: SROM at 1330 Amniotic color: clear   Assessment / Plan: Spontaneous labor, UCs stopped   Labor: Labor augmented with Pitocin at 68mU, MVUs at 80 Preeclampsia:  114/71 Fetal Wellbeing:  Category I Pain Control:  Epidural I/D:  GBS pos, 2nd dose of Abx at 1912 Anticipated MOD:  NSVD  13m, CNM 06/28/2019, 7:12 AM

## 2019-06-28 NOTE — Progress Notes (Signed)
19yo G2P1001 at 37+4wks presenting yesterday in spontaneous labor with SROM yesterday 24hrs ago, who has been on pitocin since 5pm yesterday, without adequate contractions until high dose pit today. She has not had adequate contractions, with max of 170 up to 69mu/min of pitocin. She has not substantially changed her cervix since yesterday, and after multiple interventions and discussion with patient, we have determined to proceed to cesarean section.  The risks of cesarean section discussed with the patient included but were not limited to: bleeding which may require transfusion or reoperation; infection which may require antibiotics; injury to bowel, bladder, ureters or other surrounding organs; injury to the fetus; need for additional procedures including hysterectomy in the event of a life-threatening hemorrhage; placental abnormalities wth subsequent pregnancies, incisional problems, thromboembolic phenomenon and other postoperative/anesthesia complications. The patient concurred with the proposed plan, giving informed written consent for the procedure. Anesthesia and OR aware. Preoperative prophylactic antibiotics and SCDs ordered on call to the OR.  To OR when ready.

## 2019-06-28 NOTE — Transfer of Care (Signed)
Immediate Anesthesia Transfer of Care Note  Patient: Morgan Collier  Procedure(s) Performed: CESAREAN SECTION (N/A )  Patient Location: PACU  Anesthesia Type:Epidural  Level of Consciousness: awake, alert  and oriented  Airway & Oxygen Therapy: Patient Spontanous Breathing  Post-op Assessment: Report given to RN and Post -op Vital signs reviewed and stable  Post vital signs: Reviewed and stable  Last Vitals:  Vitals Value Taken Time  BP 126/88 06/28/19 1524  Temp 36.5 C 06/28/19 1524  Pulse 118 06/28/19 1524  Resp 22 06/28/19 1524  SpO2 99 % 06/28/19 1524    Last Pain:  Vitals:   06/28/19 1524  TempSrc: Oral  PainSc:       Patients Stated Pain Goal: 0 (06/27/19 1542)  Complications: No apparent anesthesia complications

## 2019-06-28 NOTE — Progress Notes (Signed)
Labor Progress Note  Luma Clopper is a 19 y.o. G2P1001 at [redacted]w[redacted]d by LMP c/w [redacted]w[redacted]d admitted for labor.  Subjective:  Feeling comfortable with epidural. Ready for baby to come.   Objective: BP 122/76 (BP Location: Left Arm)   Pulse 74   Temp 98.6 F (37 C) (Oral)   Resp 15   Ht 5\' 1"  (1.549 m)   Wt 79.4 kg   LMP 10/08/2018 (Exact Date)   SpO2 100%   BMI 33.07 kg/m   Fetal Assessment: FHT:  FHR: 130 bpm, variability: moderate,  accelerations:  Present,  decelerations:  Absent Category/reactivity:  Category I UC:   regular, every 3-5 minutes SVE:    Dilation: 6cm  Effacement: 60%  Station:  -1  Consistency: soft  Position: middle  Membrane status: SROM 1330 06/27/2019 Amniotic color: clear  Labs: Lab Results  Component Value Date   WBC 10.4 06/27/2019   HGB 11.1 (L) 06/27/2019   HCT 34.6 (L) 06/27/2019   MCV 67.2 (L) 06/27/2019   PLT 254 06/27/2019    Assessment / Plan: Augmentation of labor, protracted active phase  Labor: Protracted active phase. Pitocin infusing at 19mu/min, with inadequate MVUs; continue to titrate per protocl to adequate MVUs. Fetal Wellbeing:  Category I Pain Control:  Epidural I/D:  S/p penicillin x 5 doses for GBS prophylaxis Anticipated MOD:  NSVD  Patient presented in spontaneous early labor. She changed her cervix from 1cm to 6cm without augmentation and with SROM. Pitocin started at 1657 yesterday and titrated up to 19mu/min. It was stopped at 0320. It was restarted at 0350 with IUPC placed. Currently infusing at 30mu/min with inadequate MVUs. Plan to continue to titrate per high dose protocol to adequate MVUs. Continue regular repositioning.   Discussed with patient that it is unusual for someone to be in spontaneous labor and making cervical change, then for things to stop progressing for this amount of time. I am concerned that her baby is either in a position that will not allow her to come out vaginally or is too big. Plan to recheck  after MVUs are adequate and assess from there. Dr. 38m updated.   Dalbert Garnet, CNM 06/28/2019, 10:27 AM

## 2019-06-28 NOTE — Progress Notes (Signed)
Ch arrived at room in response to Code Blue. Pt was stabilized soon, Ch stayed and provided support to Pt's mom, daughter and partner. Ch stayed for a little over an hour, let family know about chaplain availability, and then left.

## 2019-06-28 NOTE — Op Note (Signed)
  Cesarean Section Procedure Note  Date of procedure: 06/28/2019   Pre-operative Diagnosis: Intrauterine pregnancy at [redacted]w[redacted]d;  - gDMA2 - failure to progress  Post-operative Diagnosis: same, delivered.  Procedure: Primary Low Transverse Cesarean Section through Pfannenstiel incision  Surgeon: Christeen Douglas, MD  Assistant(s):  Genia Del, CNM  Anesthesia: Epidural anesthesia  Anesthesiologist: Alver Fisher, MD Anesthesiologist: Alver Fisher, MD; Karleen Hampshire, MD CRNA: Rosanne Gutting, CRNA  Estimated Blood Loss:          Drains: FOLEY         Total IV Fluids:  Urine Output:         Specimens: Cord blood for Rh neg mom         Complications:  None; patient tolerated the procedure well.         Disposition: PACU - hemodynamically stable.         Condition: stable  Findings:  A female infant "Na'Zayla" in cephlic presentation. Amniotic fluid - Clear  Birth weight 3230 g.  Apgars of 8 and 9 at one and five minutes respectively.  Intact placenta with a three-vessel cord.  Grossly normal uterus, tubes and ovaries bilaterally. No intraabdominal adhesions were noted.  Indications: failure to progress: arrest of dilation at 6-7 cm; Direct OP with extended chin  Procedure Details  The patient was taken to Operating Room, identified as the correct patient and the procedure verified as C-Section Delivery. A formal Time Out was held with all team members present and in agreement.  After induction of anesthesia, the patient was draped and prepped in the usual sterile manner. A Pfannenstiel skin incision was made and carried down through the subcutaneous tissue to the fascia. Fascial incision was made and extended transversely with the Mayo scissors. The fascia was separated from the underlying rectus tissue superiorly and inferiorly. The peritoneum was identified and entered bluntly. Peritoneal incision was extended longitudinally. The  utero-vesical peritoneal reflection was incised transversely and a bladder flap was created digitally.   A low transverse hysterotomy was made. The fetus was delivered atraumatically. The umbilical cord was clamped x2 and cut and the infant was handed to the awaiting pediatricians. The placenta was removed intact and appeared normal, intact, and with a 3-vessel cord.   The uterus was exteriorized and cleared of all clot and debris. The hysterotomy was closed with running sutures of 0-Vicryl. A second imbricating layer was placed with the same suture. Excellent hemostasis was observed. The peritoneal cavity was cleared of all clots and debris. The uterus was returned to the abdomen. Interceed placed.  The pelvis was irrigated and again, excellent hemostasis was noted. The fascia was then reapproximated with running sutures of 0 Vicryl.   The skin was reapproximated with Insorb. 39ml (in 30 of 0.5% bupivicaine and 72ml of NSS) of liposomal bupivicaine placed in the fascial and skin lines.  Instrument, sponge, and needle counts were correct prior to the abdominal closure and at the conclusion of the case.   The patient tolerated the procedure well and was transferred to the recovery room in stable condition.   Christeen Douglas, MD 06/28/2019

## 2019-06-28 NOTE — Progress Notes (Signed)
Labor Progress Note  Morgan Collier is a 19 y.o. G2P1001 at [redacted]w[redacted]d by LMP c/w [redacted]w[redacted]d admitted for labor.  Subjective:  Feeling some pressure in her back and bottom. Ready for labor to be done.   Objective: BP 108/67 (BP Location: Left Arm)    Pulse 89    Temp 98.3 F (36.8 C) (Oral)    Resp 16    Ht 5\' 1"  (1.549 m)    Wt 79.4 kg    LMP 10/08/2018 (Exact Date)    SpO2 98%    BMI 33.07 kg/m   Fetal Assessment: FHT:  FHR: 120 bpm, variability: moderate,  accelerations:  Present,  decelerations:  Present early, variable Category/reactivity:  Category I UC:   regular, every 2-3 minutes SVE:    Dilation: 7cm  Effacement: 60-70%  Station:  -1  Consistency: soft  Position: middle  Membrane status: SROM 1330 06/27/2019, AROM forebag now Amniotic color: clear  Labs: Lab Results  Component Value Date   WBC 10.4 06/27/2019   HGB 11.1 (L) 06/27/2019   HCT 34.6 (L) 06/27/2019   MCV 67.2 (L) 06/27/2019   PLT 254 06/27/2019    Assessment / Plan: Augmentation of labor, protracted active phase  Labor: Protracted active phase. Pitocin infusing at 4mu/min, with inadequate MVUs. AROM forebag now.  Fetal Wellbeing:  Category II, overall reassruing Pain Control:  Epidural I/D:  S/p penicillin x 5 doses for GBS prophylaxis Anticipated MOD:  NSVD  Patient presented in spontaneous early labor. She changed her cervix from 1cm to 6cm without augmentation and with SROM. Pitocin started at 1657 yesterday and titrated up to 12mu/min. It was stopped at 0320. It was restarted at 0350 with IUPC placed. Currently infusing at 88mu/min with inadequate MVUs.   Discussed with patient that she has had no to minimal cervical change since last check 2 hours ago, we have two options: 1) stop with labor augmentation and deliver by cesarean or 2) continue to augment with pitocin, reposition, and possibly give propranolol to try to increase pitocin receptor sensitivity. Patient states that she would like to deliver by  c-section, that she is ready for baby to be born.   Dr. 36m called and updated. Dr. Dalbert Garnet called, who states that anesthesia should be able to provide anesthesia staff in the next 30 minutes. Pitocin stopped.     Henrene Hawking, CNM 06/28/2019, 12:52 PM

## 2019-06-29 LAB — CBC
HCT: 26.3 % — ABNORMAL LOW (ref 36.0–46.0)
Hemoglobin: 8.2 g/dL — ABNORMAL LOW (ref 12.0–15.0)
MCH: 21.5 pg — ABNORMAL LOW (ref 26.0–34.0)
MCHC: 31.2 g/dL (ref 30.0–36.0)
MCV: 69 fL — ABNORMAL LOW (ref 80.0–100.0)
Platelets: 207 10*3/uL (ref 150–400)
RBC: 3.81 MIL/uL — ABNORMAL LOW (ref 3.87–5.11)
RDW: 17.2 % — ABNORMAL HIGH (ref 11.5–15.5)
WBC: 9.9 10*3/uL (ref 4.0–10.5)
nRBC: 0 % (ref 0.0–0.2)

## 2019-06-29 LAB — GLUCOSE, CAPILLARY
Glucose-Capillary: 74 mg/dL (ref 70–99)
Glucose-Capillary: 84 mg/dL (ref 70–99)
Glucose-Capillary: 91 mg/dL (ref 70–99)

## 2019-06-29 LAB — FETAL SCREEN: Fetal Screen: NEGATIVE

## 2019-06-29 MED ORDER — RHO D IMMUNE GLOBULIN 1500 UNIT/2ML IJ SOSY
300.0000 ug | PREFILLED_SYRINGE | Freq: Once | INTRAMUSCULAR | Status: AC
  Administered 2019-06-29: 300 ug via INTRAVENOUS
  Filled 2019-06-29: qty 2

## 2019-06-29 NOTE — Anesthesia Post-op Follow-up Note (Signed)
  Anesthesia Pain Follow-up Note  Patient: Morgan Collier  Day #: 1  Date of Follow-up: 06/29/2019 Time: 8:24 AM  Last Vitals:  Vitals:   06/29/19 0500 06/29/19 0600  BP:    Pulse: 92 87  Resp:    Temp:    SpO2: 97% 97%    Level of Consciousness: alert  Pain: mild   Side Effects:None  Catheter Site Exam:clean, dry, no drainage     Plan: D/C from anesthesia care at surgeon's request  Rosanne Gutting

## 2019-06-29 NOTE — Lactation Note (Signed)
This note was copied from a baby's chart. Lactation Consultation Note  Patient Name: Morgan Collier [id number] Date: 06/29/2019 Reason for consult: Initial assessment;Early term 37-38.6wks;Other (Comment)(c-section; maternal complications)  LC in to see mom and baby. Mom is G2P2, delivered via c-section with maternal complications. Mom has breastfeeding history of 4 weeks with first child, reports child was "greedy" and she never felt she made enough. Baby was positioned well in football hold on right breast, showing a strong rhythmic sucking pattern with flanged top/bottom lips. Mom notes tugging sensation; no pain/discomfort, but is feeling sleepy with feeds and uterine contractions. Baby's voids/stools are above expectations. Provided reassurance and praise, encouraged feedings on cue at the breast, limiting formula exposure if able, to help establish a plentiful milk supply for baby. Reviewed newborn stomach size, feeding patterns, early cues, milk supply and demand, normal course of lactation, and benefits of skin to skin. Encouraged to reach out to [facility] for ongoing breastfeeding support as needed, to ask questions if needed. LC number available on whiteboard for parents.  Maternal Data Formula Feeding for Exclusion: No Has patient been taught Hand Expression?: Yes Does the patient have breastfeeding experience prior to this delivery?: Yes  Feeding Feeding Type: Breast Fed  LATCH Score Latch: Grasps breast easily, tongue down, lips flanged, rhythmical sucking.  Audible Swallowing: A few with stimulation  Type of Nipple: Everted at rest and after stimulation  Comfort (Breast/Nipple): Soft / non-tender  Hold (Positioning): No assistance needed to correctly position infant at breast.  LATCH Score: 9  Interventions Interventions: Breast feeding basics reviewed  Lactation Tools Discussed/Used     Consult Status Consult Status: PRN    Danford Bad 06/29/2019, 11:55  AM

## 2019-06-29 NOTE — Progress Notes (Signed)
Post Partum Day 1 Subjective: Doing well, no complaints.  Tolerating regular diet, pain with PO meds, voiding and ambulating with some assistance.  No CP SOB Fever,Chills, N/V or leg pain; denies nipple or breast pain no HA change of vision, RUQ/epigastric pain  Objective: BP 113/72 (BP Location: Right Arm)   Pulse 92   Temp 98.7 F (37.1 C) (Oral)   Resp 20   Ht 5\' 1"  (1.549 m)   Wt 79.4 kg   LMP 10/08/2018 (Exact Date)   SpO2 97%   Breastfeeding Unknown   BMI 33.07 kg/m    Physical Exam:  General: NAD Breasts: soft/nontender CV: RRR Pulm: nl effort, CTABL Abdomen: soft, NT, BS x 4 Incision: Dsg CDI, no erythema or drainage Lochia: moderate Uterine Fundus: fundus firm and 1 fb below umbilicus DVT Evaluation: no cords, ttp LEs   Recent Labs    06/27/19 1307 06/29/19 0618  HGB 11.1* 8.2*  HCT 34.6* 26.3*  WBC 10.4 9.9  PLT 254 207    Assessment/Plan: 19 y.o. G2P2002 postpartum day # 1  - Continue routine PP care, discussed pain management and encouraged ambulation.  - Lactation consult prn   - Acute blood loss anemia - hemodynamically stable and asymptomatic; start po ferrous sulfate BID with stool softeners  - Immunization status: Needs varicella prior to DC    Disposition: Does not desire Dc home today.     36, CNM 06/29/2019  1:21 PM

## 2019-06-29 NOTE — Progress Notes (Signed)
Ch visited with Pt to follow up from yesterday. Ch met with pt and Pt's partner, and baby. They were grateful for visit.

## 2019-06-29 NOTE — Anesthesia Postprocedure Evaluation (Signed)
Anesthesia Post Note  Patient: Morgan Collier  Procedure(s) Performed: CESAREAN SECTION (N/A )  Patient location during evaluation: Mother Baby Anesthesia Type: Epidural Level of consciousness: awake and alert Pain management: pain level controlled Vital Signs Assessment: post-procedure vital signs reviewed and stable Respiratory status: spontaneous breathing, nonlabored ventilation and respiratory function stable Cardiovascular status: stable Postop Assessment: no headache, no backache, epidural receding and patient able to bend at knees Anesthetic complications: no     Last Vitals:  Vitals:   06/29/19 0500 06/29/19 0600  BP:    Pulse: 92 87  Resp:    Temp:    SpO2: 97% 97%    Last Pain:  Vitals:   06/29/19 0617  TempSrc:   PainSc: 8                  Rosanne Gutting

## 2019-06-30 LAB — RHOGAM INJECTION: Unit division: 0

## 2019-06-30 LAB — GLUCOSE, CAPILLARY
Glucose-Capillary: 70 mg/dL (ref 70–99)
Glucose-Capillary: 64 mg/dL — ABNORMAL LOW (ref 70–99)

## 2019-06-30 MED ORDER — SENNOSIDES-DOCUSATE SODIUM 8.6-50 MG PO TABS: 2.0000 | ORAL_TABLET | ORAL | Status: DC

## 2019-06-30 MED ORDER — IBUPROFEN 800 MG PO TABS: 800.0000 mg | ORAL_TABLET | Freq: Four times a day (QID) | ORAL | 3 refills | Status: DC

## 2019-06-30 MED ORDER — OXYCODONE HCL 5 MG PO TABS: 5.0000 mg | ORAL_TABLET | ORAL | 0 refills | Status: AC | PRN

## 2019-06-30 MED ORDER — ACETAMINOPHEN 500 MG PO TABS: 1000.0000 mg | ORAL_TABLET | Freq: Four times a day (QID) | ORAL | 0 refills | Status: DC

## 2019-06-30 NOTE — Lactation Note (Signed)
This note was copied from a baby's chart. Lactation Consultation Note  Patient Name: Morgan Collier Date: 06/30/2019 Reason for consult: Follow-up assessment;Early term 37-38.6wks  LC in to see mom and baby before discharge later today. Mom reports continued breastfeeding overnight, but due to stress of delivery, and baby's weight loss she decided to give some formula this morning after breastfeeding.  Information given for normal course of lactation, milk supply and demand, and baby's ability to bring in milk supply and regulate supply. Discussed impact that formula may have on bringing in an adequate supply and encouraged continued offering of breast first.  Mom has no noted pain or discomfort while baby is at the breast, reporting baby staying alert for full feedings, and has no questions at this time. LC started to provide additional information, but mom was on the phone. Briefly encouraged to call out today if she had any questions before leaving.   Maternal Data Formula Feeding for Exclusion: No Has patient been taught Hand Expression?: Yes  Feeding    LATCH Score                   Interventions Interventions: Breast feeding basics reviewed  Lactation Tools Discussed/Used     Consult Status Consult Status: Complete    Danford Bad 06/30/2019, 9:41 AM

## 2019-06-30 NOTE — Progress Notes (Signed)
Discharge order received from doctor. Varicella vaccine offered at discharge. Pt refused varicella vaccine. Incision cleaning kit given and reviewed. Reviewed discharge instructions and prescriptions with patient and answered all questions. Follow up appointment given. Patient verbalized understanding. ID bands checked. Patient discharged home with infant via wheelchair by nursing/auxillary.    Audree Bane, RN

## 2019-06-30 NOTE — Discharge Instructions (Signed)
Please call your doctor or return to the ER if you experience any chest pains, shortness of breath, dizziness, visual changes, severe headache (unrelieved by pain meds), fever greater than 101, any heavy bleeding (saturating more than 1 pad per hour), large clots, or foul smelling discharge, any worsening abdominal pain and cramping that is not controlled by pain medication, any calf/leg pain or redness, any breast concerns (redness/pain), or any signs of postpartum depression. No tampons, enemas, douches, or sexual intercourse for 6 weeks. Also avoid tub baths, hot tubs, or swimming for 6 weeks.    Check your incision daily for any signs of infection such as redness, warmth, swelling, increased pain, or pus/foul smelling drainage   Activity: do not lift over 10-15 lbs for 6 weeks  No driving for 1-2 weeks  Pelvic rest for 6 weeks 

## 2020-02-15 ENCOUNTER — Emergency Department
Admission: EM | Admit: 2020-02-15 | Discharge: 2020-02-16 | Disposition: A | Discharge status: Home / Self Care | Payer: Medicaid Other | Attending: Emergency Medicine | Admitting: Emergency Medicine

## 2020-02-15 ENCOUNTER — Other Ambulatory Visit: Payer: Self-pay

## 2020-02-15 ENCOUNTER — Emergency Department: Payer: Medicaid Other

## 2020-02-15 ENCOUNTER — Encounter: Payer: Self-pay | Admitting: Emergency Medicine

## 2020-02-15 DIAGNOSIS — O26891 Other specified pregnancy related conditions, first trimester: Secondary | ICD-10-CM | POA: Diagnosis not present

## 2020-02-15 DIAGNOSIS — O209 Hemorrhage in early pregnancy, unspecified: Secondary | ICD-10-CM | POA: Insufficient documentation

## 2020-02-15 DIAGNOSIS — Z3A01 Less than 8 weeks gestation of pregnancy: Secondary | ICD-10-CM | POA: Diagnosis not present

## 2020-02-15 DIAGNOSIS — O208 Other hemorrhage in early pregnancy: Secondary | ICD-10-CM | POA: Diagnosis not present

## 2020-02-15 DIAGNOSIS — Z3A00 Weeks of gestation of pregnancy not specified: Secondary | ICD-10-CM | POA: Diagnosis not present

## 2020-02-15 LAB — CBC WITH DIFFERENTIAL/PLATELET
Abs Immature Granulocytes: 0 10*3/uL (ref 0.00–0.07)
Basophils Absolute: 0 10*3/uL (ref 0.0–0.1)
Basophils Relative: 0 %
Eosinophils Absolute: 0.2 10*3/uL (ref 0.0–0.5)
Eosinophils Relative: 2 %
HCT: 34.7 % — ABNORMAL LOW (ref 36.0–46.0)
Hemoglobin: 10.8 g/dL — ABNORMAL LOW (ref 12.0–15.0)
Lymphocytes Relative: 18 %
Lymphs Abs: 2 10*3/uL (ref 0.7–4.0)
MCH: 20.5 pg — ABNORMAL LOW (ref 26.0–34.0)
MCHC: 31.1 g/dL (ref 30.0–36.0)
MCV: 66 fL — ABNORMAL LOW (ref 80.0–100.0)
Monocytes Absolute: 0.7 10*3/uL (ref 0.1–1.0)
Monocytes Relative: 6 %
Neutro Abs: 8.2 10*3/uL — ABNORMAL HIGH (ref 1.7–7.7)
Neutrophils Relative %: 74 %
Platelets: 342 10*3/uL (ref 150–400)
RBC: 5.26 MIL/uL — ABNORMAL HIGH (ref 3.87–5.11)
RDW: 16.4 % — ABNORMAL HIGH (ref 11.5–15.5)
WBC: 11.1 10*3/uL — ABNORMAL HIGH (ref 4.0–10.5)
nRBC: 0 % (ref 0.0–0.2)

## 2020-02-15 LAB — COMPREHENSIVE METABOLIC PANEL
ALT: 11 U/L (ref 0–44)
AST: 16 U/L (ref 15–41)
Albumin: 3.8 g/dL (ref 3.5–5.0)
Alkaline Phosphatase: 71 U/L (ref 38–126)
Anion gap: 10 (ref 5–15)
BUN: 9 mg/dL (ref 6–20)
CO2: 25 mmol/L (ref 22–32)
Calcium: 9.3 mg/dL (ref 8.9–10.3)
Chloride: 103 mmol/L (ref 98–111)
Creatinine, Ser: 0.68 mg/dL (ref 0.44–1.00)
GFR, Estimated: >60 mL/min (ref >60)
Glucose, Bld: 137 mg/dL — ABNORMAL HIGH (ref 70–99)
Potassium: 3.6 mmol/L (ref 3.5–5.1)
Sodium: 138 mmol/L (ref 135–145)
Total Bilirubin: 0.4 mg/dL (ref 0.3–1.2)
Total Protein: 7.9 g/dL (ref 6.5–8.1)

## 2020-02-15 LAB — URINALYSIS, COMPLETE (UACMP) WITH MICROSCOPIC
Bilirubin Urine: NEGATIVE
Glucose, UA: >=500 mg/dL — AB
Ketones, ur: 5 mg/dL — AB
Nitrite: NEGATIVE
Protein, ur: NEGATIVE mg/dL
Specific Gravity, Urine: 1.021 (ref 1.005–1.030)
pH: 5 (ref 5.0–8.0)

## 2020-02-15 LAB — POC URINE PREG, ED: Preg Test, Ur: POSITIVE — AB

## 2020-02-15 LAB — HCG, QUANTITATIVE, PREGNANCY: hCG, Beta Chain, Quant, S: 131453 m[IU]/mL — ABNORMAL HIGH (ref <5)

## 2020-02-15 LAB — ABO/RH: ABO/RH(D): A NEG

## 2020-02-15 NOTE — ED Triage Notes (Signed)
Pt to ED from home c/o abd cramping, vaginal bleeding, and nausea today.  States approx 1 month pregnant but unsure, G3 P2.  Concerned for possible miscarriage.  Pt skin WNL, chest rise even and unlabored, in NAD at this time.

## 2020-03-19 DIAGNOSIS — Z30017 Encounter for initial prescription of implantable subdermal contraceptive: Secondary | ICD-10-CM | POA: Diagnosis not present

## 2020-03-19 DIAGNOSIS — Z113 Encounter for screening for infections with a predominantly sexual mode of transmission: Secondary | ICD-10-CM | POA: Diagnosis not present

## 2020-03-19 DIAGNOSIS — Z3202 Encounter for pregnancy test, result negative: Secondary | ICD-10-CM | POA: Diagnosis not present

## 2020-03-22 DIAGNOSIS — O3680X0 Pregnancy with inconclusive fetal viability, not applicable or unspecified: Secondary | ICD-10-CM | POA: Diagnosis not present

## 2020-03-22 DIAGNOSIS — Z3490 Encounter for supervision of normal pregnancy, unspecified, unspecified trimester: Secondary | ICD-10-CM | POA: Diagnosis not present

## 2020-03-26 DIAGNOSIS — O3680X0 Pregnancy with inconclusive fetal viability, not applicable or unspecified: Secondary | ICD-10-CM | POA: Diagnosis not present

## 2020-03-26 DIAGNOSIS — Z3490 Encounter for supervision of normal pregnancy, unspecified, unspecified trimester: Secondary | ICD-10-CM | POA: Diagnosis not present

## 2020-04-02 DIAGNOSIS — O3680X0 Pregnancy with inconclusive fetal viability, not applicable or unspecified: Secondary | ICD-10-CM | POA: Diagnosis not present

## 2020-04-02 DIAGNOSIS — Z3490 Encounter for supervision of normal pregnancy, unspecified, unspecified trimester: Secondary | ICD-10-CM | POA: Diagnosis not present

## 2020-06-08 DIAGNOSIS — O24414 Gestational diabetes mellitus in pregnancy, insulin controlled: Secondary | ICD-10-CM | POA: Diagnosis not present

## 2020-06-08 DIAGNOSIS — D500 Iron deficiency anemia secondary to blood loss (chronic): Secondary | ICD-10-CM | POA: Diagnosis not present

## 2020-06-08 DIAGNOSIS — Z1159 Encounter for screening for other viral diseases: Secondary | ICD-10-CM | POA: Diagnosis not present

## 2020-06-08 DIAGNOSIS — Z7689 Persons encountering health services in other specified circumstances: Secondary | ICD-10-CM | POA: Diagnosis not present

## 2020-06-08 DIAGNOSIS — O99519 Diseases of the respiratory system complicating pregnancy, unspecified trimester: Secondary | ICD-10-CM | POA: Diagnosis not present

## 2020-07-06 ENCOUNTER — Ambulatory Visit: Payer: Self-pay

## 2020-07-09 ENCOUNTER — Other Ambulatory Visit: Payer: Self-pay

## 2020-07-09 ENCOUNTER — Ambulatory Visit (LOCAL_COMMUNITY_HEALTH_CENTER): Payer: Medicaid Other | Admitting: Advanced Practice Midwife

## 2020-07-09 ENCOUNTER — Encounter: Payer: Self-pay | Admitting: Advanced Practice Midwife

## 2020-07-09 VITALS — BP 122/79 | HR 98 | Wt 145.4 lb

## 2020-07-09 DIAGNOSIS — E663 Overweight: Secondary | ICD-10-CM

## 2020-07-09 DIAGNOSIS — K589 Irritable bowel syndrome without diarrhea: Secondary | ICD-10-CM

## 2020-07-09 DIAGNOSIS — Z3009 Encounter for other general counseling and advice on contraception: Secondary | ICD-10-CM | POA: Diagnosis not present

## 2020-07-09 DIAGNOSIS — O24419 Gestational diabetes mellitus in pregnancy, unspecified control: Secondary | ICD-10-CM

## 2020-07-09 DIAGNOSIS — F53 Postpartum depression: Secondary | ICD-10-CM | POA: Insufficient documentation

## 2020-07-09 LAB — WET PREP FOR TRICH, YEAST, CLUE
Trichomonas Exam: NEGATIVE
Yeast Exam: NEGATIVE

## 2020-07-09 NOTE — Progress Notes (Signed)
Wet mount reviewed - negative. Deziray Nabi, RN  

## 2020-07-09 NOTE — Progress Notes (Signed)
[facility] DEPARTMENT [facility] [address] Main Number: [phone]    Family Planning Visit- Initial Visit  Subjective:  Morgan Collier is a 20 y.o. SBF nonsmoker G3P2002 (1 yr, 1 yr)  being seen today for an initial annual visit and to discuss contraceptive options.  The patient is currently using None for pregnancy prevention. Patient reports she does not want a pregnancy in the next year.  Patient has the following medical conditions has Hidradenitis suppurativa of right axilla; Sepsis (HCC); Hypokalemia; Asthma; Preterm uterine contractions, antepartum, third trimester; and Overweight BMI=27.4 on their problem list.  Chief Complaint  Patient presents with   Annual Exam    Patient reports here for physical. LMP 06/29/20. Last sex 07/07/20 with condom; with current partner x 3 years; 1 sex partner in last 3 mo. First baby born 08/03/18 and second child by c/s 06/28/19. Not working. 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 Doesn't want pregnancy in next 12 mo and doesn't want any birth control. Last ETOH 07/07/20 (2 c. Tequila) 3x/mo. Liiving with her 2 kids.  Last MJ 2021.  Patient denies cigs, vaping, cigars  Body mass index is 27.47 kg/m. - Patient is eligible for diabetes screening based on BMI and age >45?  not applicable HA1C ordered? no  Patient reports 1  partner/s in last year. Desires STI screening?  Yes  Has patient been screened once for HCV in the past?  No  No results found for: HCVAB  Does the patient have current drug use (including MJ), have a partner with drug use, and/or has been incarcerated since last result? No  If yes-- Screen for HCV through [facility]   Does the patient meet criteria for HBV testing? No  Criteria:  -Household, sexual or needle sharing contact with HBV -History of drug use -HIV positive -Those with known Hep C   Health Maintenance Due  Topic Date Due   COVID-19 Vaccine (1) Never done   HPV  VACCINES (1 - 2-dose series) Never done   Hepatitis C Screening  Never done   PAP-Cervical Cytology Screening  Never done   PAP SMEAR-Modifier  Never done    Review of Systems  All other systems reviewed and are negative.  The following portions of the patient's history were reviewed and updated as appropriate: allergies, current medications, past family history, past medical history, past social history, past surgical history and problem list. Problem list updated.   See flowsheet for other program required questions.  Objective:   Vitals:   07/09/20 1031  BP: 122/79  Pulse: 98  Weight: 145 lb 6.4 oz (66 kg)  HC: 62" (157.5 cm)    Physical Exam Constitutional:      Appearance: Normal appearance. She is normal weight.  HENT:     Head: Normocephalic and atraumatic.     Mouth/Throat:     Mouth: Mucous membranes are moist.     Comments: Last dental exam 2020; urged to schedule exam asap Eyes:     Conjunctiva/sclera: Conjunctivae normal.  Neck:     Thyroid: No thyroid mass, thyromegaly or thyroid tenderness.  Cardiovascular:     Rate and Rhythm: Normal rate and regular rhythm.  Pulmonary:     Effort: Pulmonary effort is normal.     Breath sounds: Normal breath sounds.  Chest:  Breasts:    Right: Normal.     Left: Normal.  Abdominal:     Palpations: Abdomen is soft.  Tenderness: There is no right CVA tenderness, left CVA tenderness or guarding.     Comments: Soft without masses or tenderness  Genitourinary:    General: Normal vulva.     Exam position: Lithotomy position.     Vagina: Vaginal discharge (white creamy leukorrhea, ph>4.5) present.     Cervix: Friability (friable to pap) present.     Uterus: Normal.      Adnexa: Right adnexa normal and left adnexa normal.     Rectum: Normal.  Musculoskeletal:        General: Normal range of motion.     Cervical back: Normal range of motion and neck supple.  Skin:    General: Skin is warm and dry.  Neurological:      Mental Status: She is alert.  Psychiatric:        Mood and Affect: Mood normal.      Assessment and Plan:  Khalil Belote is a 20 y.o. female presenting to the [facility] Department for an initial annual wellness/contraceptive visit  Contraception counseling: Reviewed all forms of birth control options in the tiered based approach. available including abstinence; over the counter/barrier methods; hormonal contraceptive medication including pill, patch, ring, injection,contraceptive implant, ECP; hormonal and nonhormonal IUDs; permanent sterilization options including vasectomy and the various tubal sterilization modalities. Risks, benefits, and typical effectiveness rates were reviewed.  Questions were answered.  Written information was also given to the patient to review.  Patient desires nothing, this was prescribed for patient. She will follow up in  prn for surveillance.  She was told to call with any further questions, or with any concerns about this method of contraception.  Emphasized use of condoms 100% of the time for STI prevention.  Patient was offered ECP based. ECP was not accepted by the patient. ECP counseling was not given - see RN documentation  1. Overweight BMI=27.4   2. Family planning Treat wet mount per standing orders Immunization nurse consult - WET PREP FOR TRICH, YEAST, CLUE - Chlamydia/Gonorrhea [place] [facility] - HIV [place] [facility] - Syphilis Serology, [place] [facility] - Pap IG (Image Guided)     No follow-ups on file.  No future appointments.  Morgan Collier, CNM

## 2020-07-16 LAB — PAP IG (IMAGE GUIDED): PAP Smear Comment: 0

## 2020-09-04 ENCOUNTER — Ambulatory Visit: Payer: Medicaid Other

## 2020-10-22 DIAGNOSIS — D500 Iron deficiency anemia secondary to blood loss (chronic): Secondary | ICD-10-CM | POA: Diagnosis not present

## 2020-10-22 DIAGNOSIS — E559 Vitamin D deficiency, unspecified: Secondary | ICD-10-CM | POA: Diagnosis not present

## 2020-10-22 DIAGNOSIS — O99519 Diseases of the respiratory system complicating pregnancy, unspecified trimester: Secondary | ICD-10-CM | POA: Diagnosis not present

## 2020-10-22 DIAGNOSIS — E538 Deficiency of other specified B group vitamins: Secondary | ICD-10-CM | POA: Diagnosis not present

## 2020-12-03 DIAGNOSIS — J019 Acute sinusitis, unspecified: Secondary | ICD-10-CM | POA: Diagnosis not present

## 2020-12-03 DIAGNOSIS — J101 Influenza due to other identified influenza virus with other respiratory manifestations: Secondary | ICD-10-CM | POA: Diagnosis not present

## 2020-12-03 DIAGNOSIS — J209 Acute bronchitis, unspecified: Secondary | ICD-10-CM | POA: Diagnosis not present

## 2020-12-03 DIAGNOSIS — Z03818 Encounter for observation for suspected exposure to other biological agents ruled out: Secondary | ICD-10-CM | POA: Diagnosis not present

## 2021-06-28 ENCOUNTER — Emergency Department: Payer: Managed Care, Other (non HMO)

## 2021-06-28 ENCOUNTER — Emergency Department
Admission: EM | Admit: 2021-06-28 | Discharge: 2021-06-29 | Disposition: A | Discharge status: Home / Self Care | Payer: Managed Care, Other (non HMO) | Attending: Emergency Medicine | Admitting: Emergency Medicine

## 2021-06-28 ENCOUNTER — Other Ambulatory Visit: Payer: Self-pay

## 2021-06-28 DIAGNOSIS — J029 Acute pharyngitis, unspecified: Secondary | ICD-10-CM | POA: Diagnosis not present

## 2021-06-28 DIAGNOSIS — B9789 Other viral agents as the cause of diseases classified elsewhere: Secondary | ICD-10-CM | POA: Diagnosis not present

## 2021-06-28 DIAGNOSIS — J45909 Unspecified asthma, uncomplicated: Secondary | ICD-10-CM | POA: Insufficient documentation

## 2021-06-28 DIAGNOSIS — J069 Acute upper respiratory infection, unspecified: Secondary | ICD-10-CM | POA: Insufficient documentation

## 2021-06-28 DIAGNOSIS — R509 Fever, unspecified: Secondary | ICD-10-CM | POA: Diagnosis not present

## 2021-06-28 DIAGNOSIS — R0602 Shortness of breath: Secondary | ICD-10-CM | POA: Diagnosis not present

## 2021-06-28 DIAGNOSIS — Z20822 Contact with and (suspected) exposure to covid-19: Secondary | ICD-10-CM | POA: Diagnosis not present

## 2021-06-28 LAB — SARS CORONAVIRUS 2 BY RT PCR: SARS Coronavirus 2 by RT PCR: NEGATIVE

## 2021-06-28 LAB — GROUP A STREP BY PCR: Group A Strep by PCR: NOT DETECTED

## 2021-06-28 NOTE — ED Triage Notes (Signed)
Pt presents to ER c/o fever, headache and generalized body aches x3 days.  Pt denies any recent sick contacts, but states she does work at [employer] and is sometimes around other sick people.  Pt states she last took tylenol around 2000 tonight.  Also endorses a sore throat that started today.  Pt is A&O x4 at this time in NAD.

## 2021-06-28 NOTE — Discharge Instructions (Addendum)
-  Take Tylenol/ibuprofen as needed for pain/fever. -You may utilize other over-the-counter medications as well (Mucinex, Sudafed, Zyrtec) as needed. -Return to the emergency department anytime if you begin to experience any new or worsening symptoms

## 2021-06-28 NOTE — ED Notes (Addendum)
See triage note. Pt ambulatory to room, steady; pt's resp reg/unlabored, skin dry, calm. No cough or vomiting currently noted. Pt denies vomiting and diarrhea. Monitor attached to pt.

## 2021-06-29 NOTE — ED Provider Notes (Signed)
[facility] Provider Note    None    (approximate)   History   Chief Complaint Fever   HPI Morgan Collier is a 21 y.o. female, history of asthma, presents to the emergency department for evaluation of flulike symptoms.  She endorses fever, headache, and body aches x3 days.  She states that she works at [employer] clinic and is frequently around other sick people.  Endorses sore throat as well.  Denies chest pain, shortness of breath, abdominal pain, flank pain, nausea/vomiting, diarrhea, dysuria, rash/lesions, or dizziness/lightheadedness.  History Limitations: No limitations.        Physical Exam  Triage Vital Signs: ED Triage Vitals  Enc Vitals Group     BP 06/28/21 2116 128/79     Pulse Rate 06/28/21 2116 (!) 118     Resp 06/28/21 2116 20     Temp 06/28/21 2116 (!) 100.7 F (38.2 C)     Temp Source 06/28/21 2116 Oral     SpO2 06/28/21 2116 96 %     Weight 06/28/21 2117 156 lb (70.8 kg)     Height 06/28/21 2117 5\' 5"  (1.651 m)     Head Circumference --      Peak Flow --      Pain Score 06/28/21 2117 8     Pain Loc --      Pain Edu? --      Excl. in [place]? --     Most recent vital signs: Vitals:   06/28/21 2230 06/29/21 0002  BP: 123/73 107/60  Pulse: 97 88  Resp:  15  Temp:  (!) 100.4 F (38 C)  SpO2: 98% 99%    General: Awake, NAD.  Skin: Warm, dry. No rashes or lesions.  Eyes: PERRL. Conjunctivae normal.  CV: Good peripheral perfusion.  Resp: Normal effort.  Lung sounds are clear bilaterally. Abd: Soft, non-tender. No distention.  Neuro: At baseline. No gross neurological deficits.   Focused Exam: N/A.  Physical Exam    ED Results / Procedures / Treatments  Labs (all labs ordered are listed, but only abnormal results are displayed) Labs Reviewed  SARS CORONAVIRUS 2 BY RT PCR  GROUP A STREP BY PCR     EKG N/A.   RADIOLOGY  ED Provider Interpretation: I personally viewed and interpreted this chest x-ray, no  evidence of focal consolidations.  DG Chest 2 View  Result Date: 06/28/2021 CLINICAL DATA:  Fever shortness of breath EXAM: CHEST - 2 VIEW COMPARISON:  None Available. FINDINGS: The heart size and mediastinal contours are within normal limits. Both lungs are clear. Scoliosis of the spine. IMPRESSION: No active cardiopulmonary disease. Electronically Signed   By: Donavan Foil M.D.   On: 06/28/2021 23:26    PROCEDURES:  Critical Care performed: N/A.  Procedures    MEDICATIONS ORDERED IN ED: Medications - No data to display   IMPRESSION / MDM / [place] / ED COURSE  I reviewed the triage vital signs and the nursing notes.                              Differential diagnosis includes, but is not limited to, viral URI, COVID-19, strep pharyngitis, pneumonia.  ED Course Patient appears well, low-grade fever of 100.4 F.  No tachycardia or hypoxia present.  COVID-19 test negative.  Strep PCR negative.  Assessment/Plan Presentation consistent with viral URI.  Chest x-ray reassuring for rule out of pneumonia.  She is not currently having any lower respiratory symptoms at this time.  Group A strep negative.  COVID-19 negative.  Low suspicion for any serious or life-threatening pathology.  Advised patient to continue treating herself with Tylenol/ibuprofen and other over-the-counter medications as needed for her symptoms.  We will plan to discharge.  Patient's presentation is most consistent with acute complicated illness / injury requiring diagnostic workup.   Provided the patient with anticipatory guidance, return precautions, and educational material. Encouraged the patient to return to the emergency department at any time if they begin to experience any new or worsening symptoms. Patient expressed understanding and agreed with the plan.       FINAL CLINICAL IMPRESSION(S) / ED DIAGNOSES   Final diagnoses:  Viral upper respiratory tract infection     Rx / DC Orders    ED Discharge Orders     None        Note:  This document was prepared using Dragon voice recognition software and may include unintentional dictation errors.   Teodoro Spray, [place] 06/29/21 [place], MD 07/07/21 [phone]

## 2021-07-14 ENCOUNTER — Emergency Department: Payer: Managed Care, Other (non HMO)

## 2021-07-14 ENCOUNTER — Encounter: Payer: Self-pay | Admitting: Emergency Medicine

## 2021-07-14 ENCOUNTER — Other Ambulatory Visit: Payer: Self-pay

## 2021-07-14 ENCOUNTER — Emergency Department
Admission: EM | Admit: 2021-07-14 | Discharge: 2021-07-14 | Discharge status: Against Medical Advice | Payer: Managed Care, Other (non HMO) | Attending: Emergency Medicine | Admitting: Emergency Medicine

## 2021-07-14 DIAGNOSIS — Z5321 Procedure and treatment not carried out due to patient leaving prior to being seen by health care provider: Secondary | ICD-10-CM | POA: Diagnosis not present

## 2021-07-14 DIAGNOSIS — R0602 Shortness of breath: Secondary | ICD-10-CM | POA: Insufficient documentation

## 2021-07-14 DIAGNOSIS — J45909 Unspecified asthma, uncomplicated: Secondary | ICD-10-CM | POA: Diagnosis not present

## 2021-07-14 DIAGNOSIS — R059 Cough, unspecified: Secondary | ICD-10-CM | POA: Insufficient documentation

## 2021-07-14 LAB — CBC WITH DIFFERENTIAL/PLATELET
Abs Immature Granulocytes: 0.04 10*3/uL (ref 0.00–0.07)
Basophils Absolute: 0 10*3/uL (ref 0.0–0.1)
Basophils Relative: 0 %
Eosinophils Absolute: 0.1 10*3/uL (ref 0.0–0.5)
Eosinophils Relative: 1 %
HCT: 35.2 % — ABNORMAL LOW (ref 36.0–46.0)
Hemoglobin: 10.7 g/dL — ABNORMAL LOW (ref 12.0–15.0)
Immature Granulocytes: 0 %
Lymphocytes Relative: 22 %
Lymphs Abs: 2.5 10*3/uL (ref 0.7–4.0)
MCH: 21.2 pg — ABNORMAL LOW (ref 26.0–34.0)
MCHC: 30.4 g/dL (ref 30.0–36.0)
MCV: 69.8 fL — ABNORMAL LOW (ref 80.0–100.0)
Monocytes Absolute: 0.6 10*3/uL (ref 0.1–1.0)
Monocytes Relative: 5 %
Neutro Abs: 8 10*3/uL — ABNORMAL HIGH (ref 1.7–7.7)
Neutrophils Relative %: 72 %
Platelets: 360 10*3/uL (ref 150–400)
RBC: 5.04 MIL/uL (ref 3.87–5.11)
RDW: 15.8 % — ABNORMAL HIGH (ref 11.5–15.5)
WBC: 11.2 10*3/uL — ABNORMAL HIGH (ref 4.0–10.5)
nRBC: 0 % (ref 0.0–0.2)

## 2021-07-14 LAB — BASIC METABOLIC PANEL
Anion gap: 8 (ref 5–15)
BUN: 10 mg/dL (ref 6–20)
CO2: 25 mmol/L (ref 22–32)
Calcium: 9.4 mg/dL (ref 8.9–10.3)
Chloride: 106 mmol/L (ref 98–111)
Creatinine, Ser: 0.62 mg/dL (ref 0.44–1.00)
GFR, Estimated: >60 mL/min (ref >60)
Glucose, Bld: 106 mg/dL — ABNORMAL HIGH (ref 70–99)
Potassium: 4 mmol/L (ref 3.5–5.1)
Sodium: 139 mmol/L (ref 135–145)

## 2021-07-14 NOTE — ED Notes (Signed)
Called multiple times with no answer.

## 2021-07-14 NOTE — ED Triage Notes (Signed)
Pt to ED via POV with c/o cough and SOB, states was dx last week with an URI, and has hx of asthma. Pt able to to speak in full and complete sentences at this time, NAD noted. Dry, hacky cough noted in triage.

## 2021-11-10 IMAGING — US US OB < 14 WEEKS - US OB TV
1 series · 14 of 28 positions shown · non-contrast
Comparison: None.

CLINICAL DATA: Pelvic pain

EXAM:
OBSTETRIC <14 WK US AND TRANSVAGINAL OB US
TECHNIQUE: Both transabdominal and transvaginal ultrasound examinations were
performed for complete evaluation of the gestation as well as the
maternal uterus, adnexal regions, and pelvic cul-de-sac.
Transvaginal technique was performed to assess early pregnancy.

[Series 1: us ob less than 14 weeks with ob transvaginal · 14 of 129 slices shown]
[im 5/129]
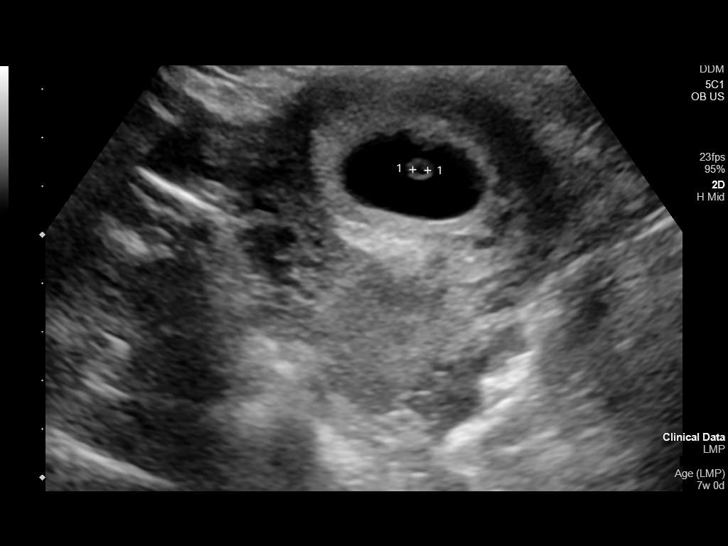
[im 15/129]
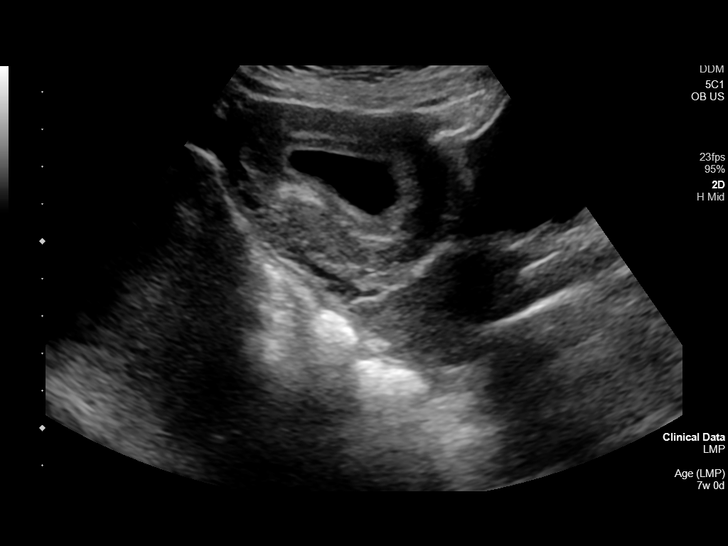
[im 24/129]
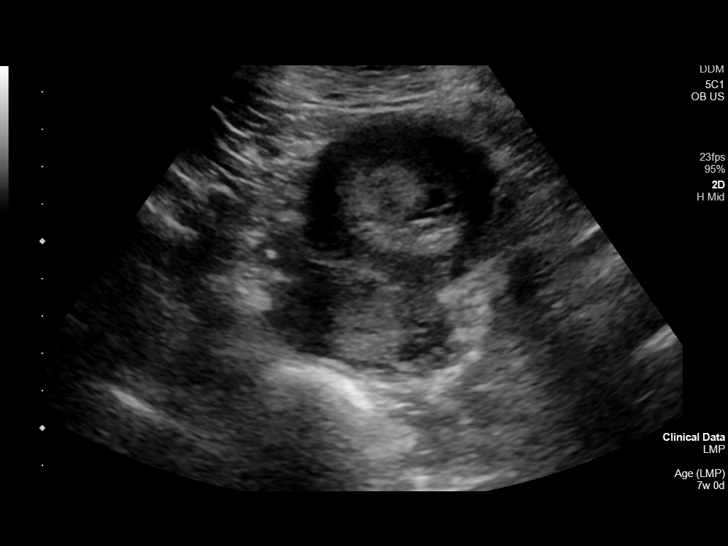
[im 34/129]
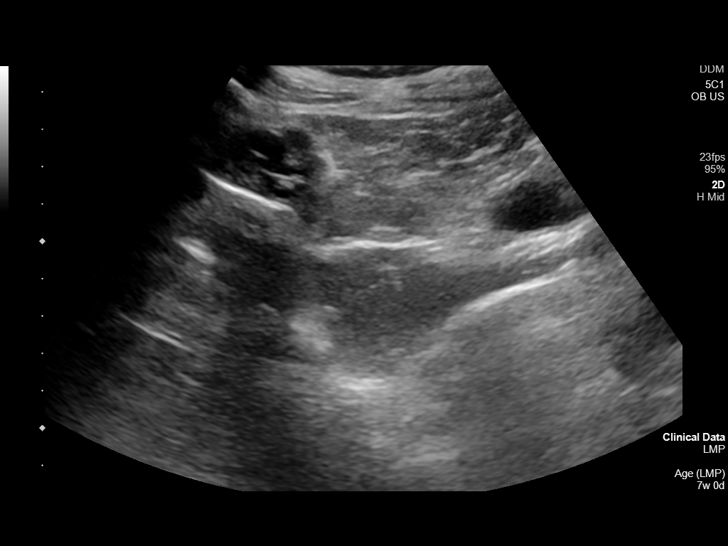
[im 43/129]
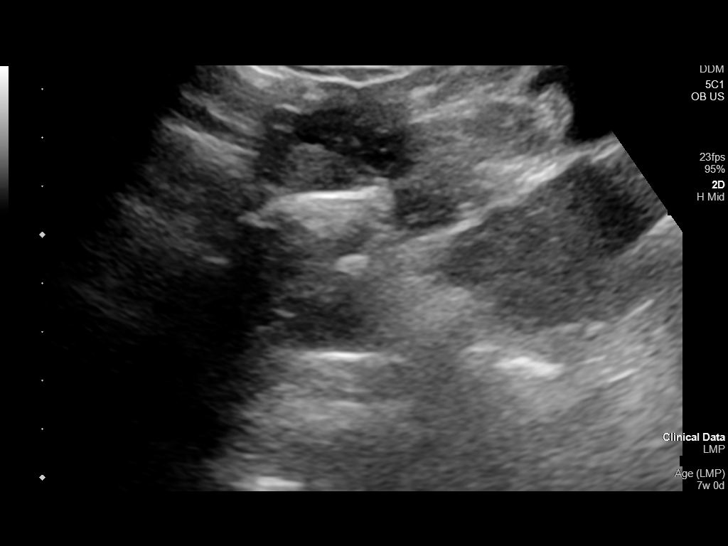
[im 53/129]
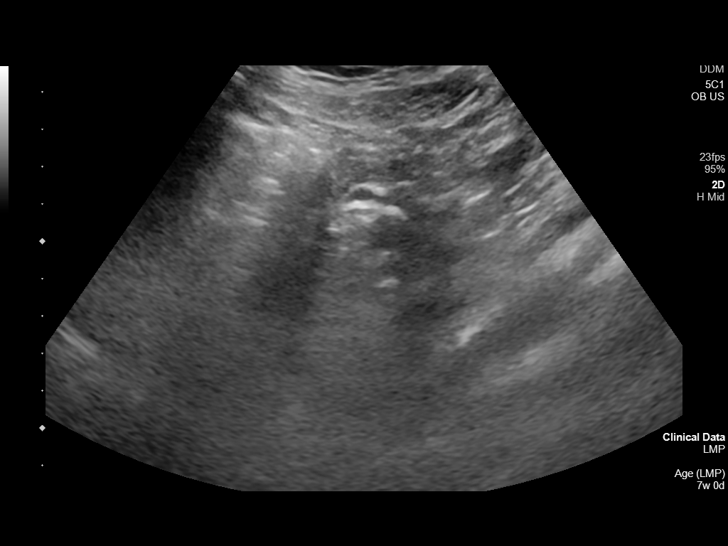
[im 62/129]
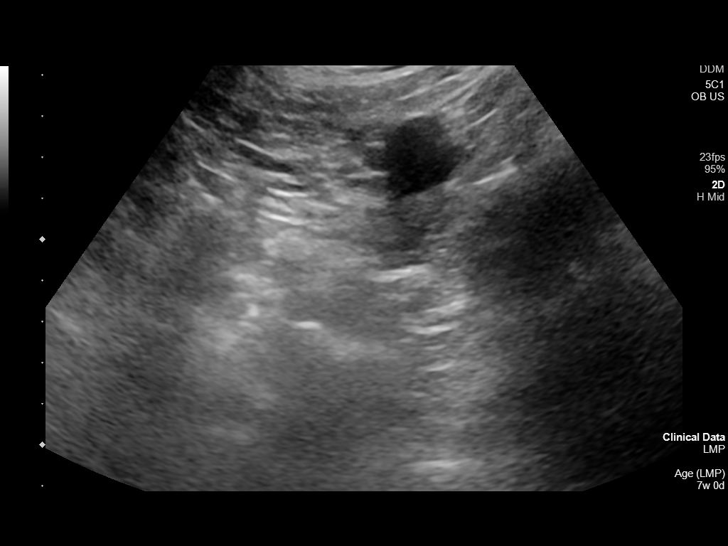
[im 72/129]
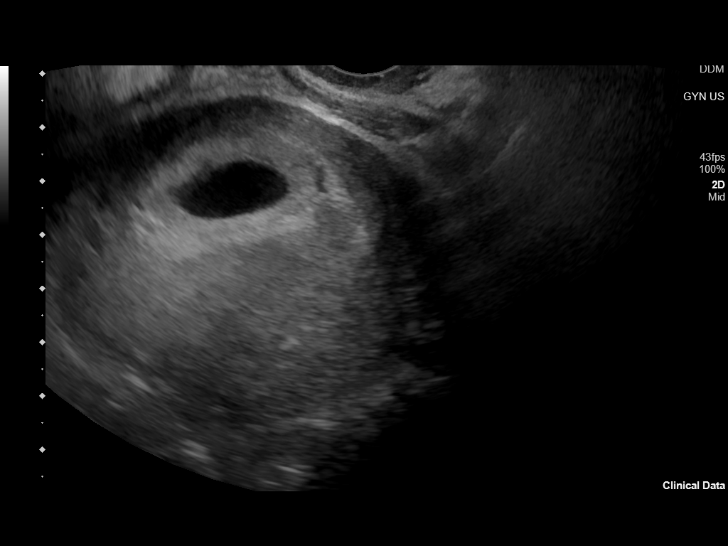
[im 81/129]
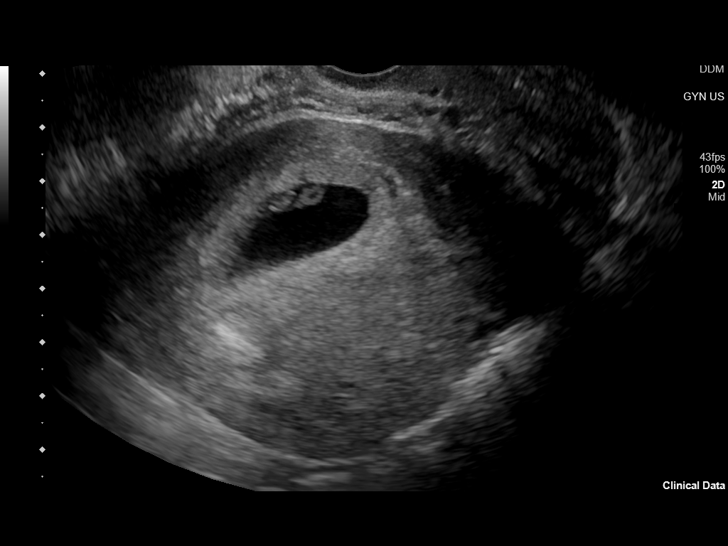
[im 91/129]
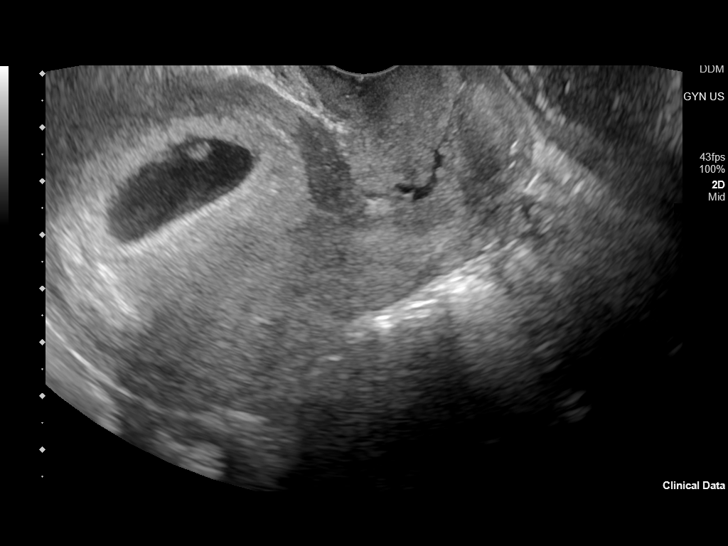
[im 100/129]
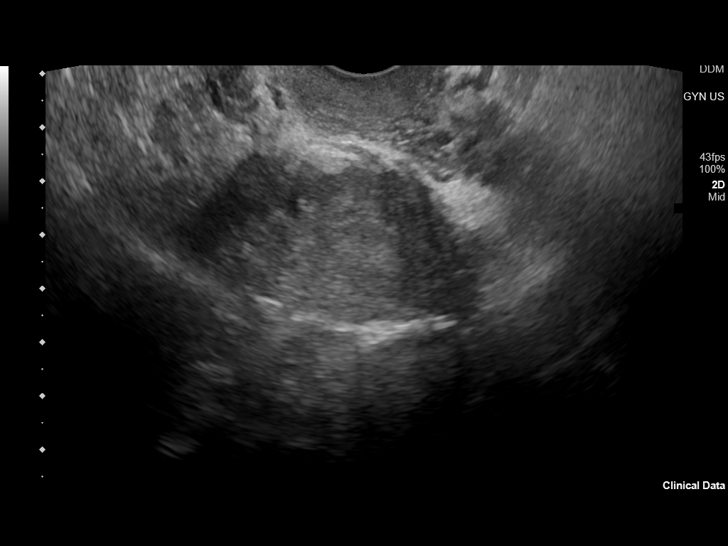
[im 110/129]
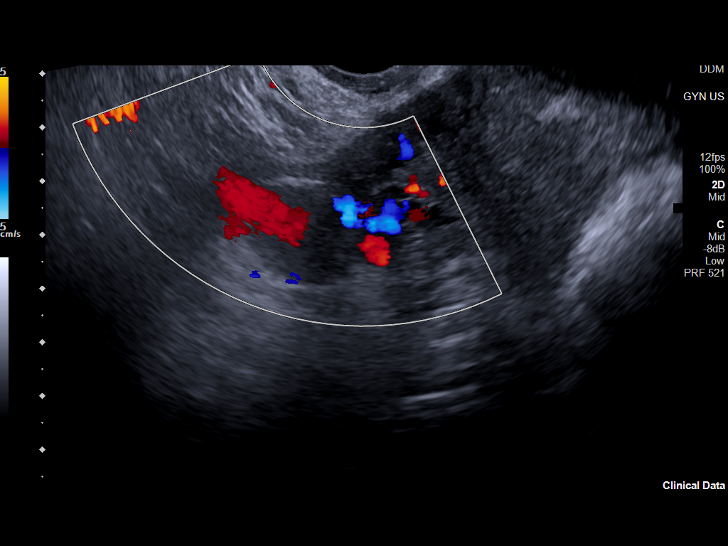
[im 119/129]
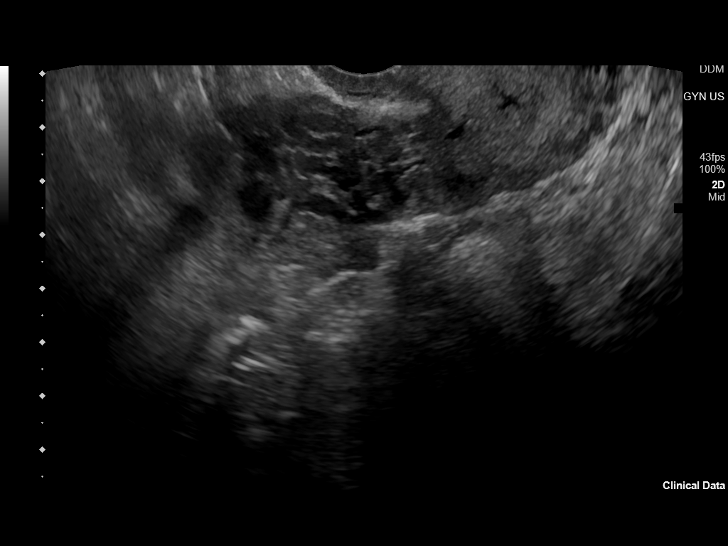
[im 129/129]
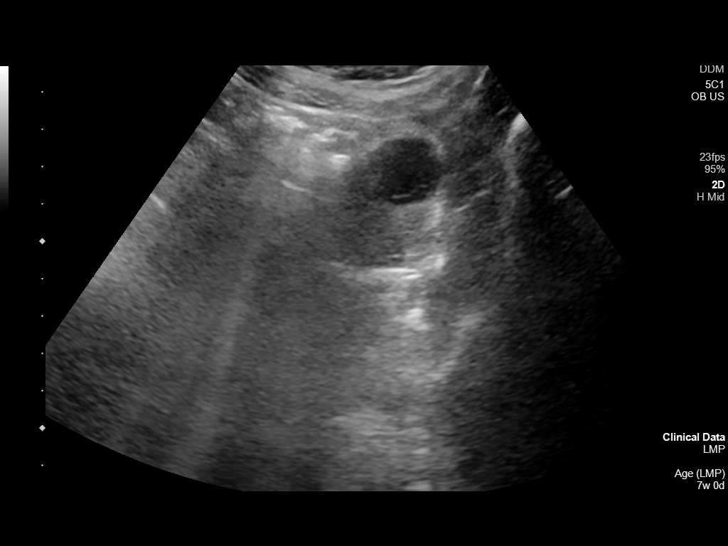

[14 of 28 positions shown; findings below may reference images not displayed]

FINDINGS: Intrauterine gestational sac: Single

Yolk sac:  Visualized.

Embryo:  Visualized.

Cardiac Activity: Visualized.

Heart Rate: 150 bpm

CRL:  7.8 mm   6 w   5 d                  US EDC: 10/05/2020

Subchorionic hemorrhage:  None visualized.

Maternal uterus/adnexae: Normal appearing ovaries. A small
subchorionic hemorrhage is seen.
IMPRESSION: Single live intrauterine pregnancy measuring 6 weeks 5 days. Small
subchorionic hemorrhage

## 2022-03-13 ENCOUNTER — Emergency Department
Admission: EM | Admit: 2022-03-13 | Discharge: 2022-03-13 | Disposition: A | Discharge status: Home / Self Care | Payer: Managed Care, Other (non HMO) | Attending: Emergency Medicine | Admitting: Emergency Medicine

## 2022-03-13 ENCOUNTER — Other Ambulatory Visit: Payer: Self-pay

## 2022-03-13 ENCOUNTER — Emergency Department: Payer: Managed Care, Other (non HMO)

## 2022-03-13 DIAGNOSIS — O209 Hemorrhage in early pregnancy, unspecified: Secondary | ICD-10-CM | POA: Diagnosis not present

## 2022-03-13 DIAGNOSIS — O208 Other hemorrhage in early pregnancy: Secondary | ICD-10-CM | POA: Diagnosis not present

## 2022-03-13 DIAGNOSIS — O4690 Antepartum hemorrhage, unspecified, unspecified trimester: Secondary | ICD-10-CM

## 2022-03-13 DIAGNOSIS — Z3A01 Less than 8 weeks gestation of pregnancy: Secondary | ICD-10-CM | POA: Insufficient documentation

## 2022-03-13 LAB — URINALYSIS, ROUTINE W REFLEX MICROSCOPIC
Bilirubin Urine: NEGATIVE
Glucose, UA: NEGATIVE mg/dL
Hgb urine dipstick: NEGATIVE
Ketones, ur: NEGATIVE mg/dL
Nitrite: NEGATIVE
Protein, ur: NEGATIVE mg/dL
Specific Gravity, Urine: 1.026 (ref 1.005–1.030)
pH: 7 (ref 5.0–8.0)

## 2022-03-13 LAB — COMPREHENSIVE METABOLIC PANEL
ALT: 14 U/L (ref 0–44)
AST: 16 U/L (ref 15–41)
Albumin: 3.8 g/dL (ref 3.5–5.0)
Alkaline Phosphatase: 51 U/L (ref 38–126)
Anion gap: 8 (ref 5–15)
BUN: 8 mg/dL (ref 6–20)
CO2: 23 mmol/L (ref 22–32)
Calcium: 9.1 mg/dL (ref 8.9–10.3)
Chloride: 101 mmol/L (ref 98–111)
Creatinine, Ser: 0.59 mg/dL (ref 0.44–1.00)
GFR, Estimated: >60 mL/min (ref >60)
Glucose, Bld: 93 mg/dL (ref 70–99)
Potassium: 3.8 mmol/L (ref 3.5–5.1)
Sodium: 132 mmol/L — ABNORMAL LOW (ref 135–145)
Total Bilirubin: 0.3 mg/dL (ref 0.3–1.2)
Total Protein: 8 g/dL (ref 6.5–8.1)

## 2022-03-13 LAB — CBC
HCT: 37.7 % (ref 36.0–46.0)
Hemoglobin: 11.8 g/dL — ABNORMAL LOW (ref 12.0–15.0)
MCH: 22.3 pg — ABNORMAL LOW (ref 26.0–34.0)
MCHC: 31.3 g/dL (ref 30.0–36.0)
MCV: 71.3 fL — ABNORMAL LOW (ref 80.0–100.0)
Platelets: 336 10*3/uL (ref 150–400)
RBC: 5.29 MIL/uL — ABNORMAL HIGH (ref 3.87–5.11)
RDW: 15 % (ref 11.5–15.5)
WBC: 12 10*3/uL — ABNORMAL HIGH (ref 4.0–10.5)
nRBC: 0 % (ref 0.0–0.2)

## 2022-03-13 LAB — ANTIBODY SCREEN: Antibody Screen: NEGATIVE

## 2022-03-13 LAB — ABO/RH: ABO/RH(D): A NEG

## 2022-03-13 LAB — POC URINE PREG, ED: Preg Test, Ur: POSITIVE — AB

## 2022-03-13 LAB — HCG, QUANTITATIVE, PREGNANCY: hCG, Beta Chain, Quant, S: 135002 m[IU]/mL — ABNORMAL HIGH (ref <5)

## 2022-03-13 MED ORDER — RHO D IMMUNE GLOBULIN 1500 UNIT/2ML IJ SOSY
300.0000 ug | PREFILLED_SYRINGE | Freq: Once | INTRAMUSCULAR | Status: AC
  Administered 2022-03-13: 300 ug via INTRAMUSCULAR
  Filled 2022-03-13: qty 2

## 2022-03-13 NOTE — ED Triage Notes (Signed)
Pt states she thinks she is [redacted] weeks pregnant. Pt states vaginal bleeding and cramping in lower abdomen starting last night. Pt states she has gone though one pad so far. Pt states mild nausea.

## 2022-03-13 NOTE — Discharge Instructions (Signed)
Please make follow-up appointment with your OB/GYN.

## 2022-03-13 NOTE — ED Notes (Signed)
Pink top sent to lab

## 2022-03-13 NOTE — ED Provider Notes (Signed)
[facility] Provider Note  Patient Contact: 3:22 PM (approximate)   History   Vaginal Bleeding   HPI  Morgan Collier is a 22 y.o. female G3, P2, presents to the emergency department with cramping and vaginal bleeding that started this morning.  Patient states that she is approximately [redacted] weeks pregnant.  She states that bleeding originally started as dark in nature and has progressed to bright red blood.  Patient states that she feels as though she is having her..  She has had no prior miscarriages.  Other than cramps, she has had no pain and denies abdominal pain or vomiting.  Denies dysuria or increased urinary frequency.      Physical Exam   Triage Vital Signs: ED Triage Vitals  Enc Vitals Group     BP 03/13/22 1258 133/83     Pulse Rate 03/13/22 1258 91     Resp 03/13/22 1258 20     Temp 03/13/22 1258 98.7 F (37.1 C)     Temp Source 03/13/22 1258 Oral     SpO2 03/13/22 1258 100 %     Weight 03/13/22 1259 156 lb (70.8 kg)     Height 03/13/22 1259 5' 2"$  (1.575 m)     Head Circumference --      Peak Flow --      Pain Score 03/13/22 1259 7     Pain Loc --      Pain Edu? --      Excl. in [place]? --     Most recent vital signs: Vitals:   03/13/22 1258 03/13/22 1711  BP: 133/83 130/80  Pulse: 91 88  Resp: 20 18  Temp: 98.7 F (37.1 C) 98 F (36.7 C)  SpO2: 100% 99%     General: Alert and in no acute distress. Eyes:  PERRL. EOMI. Head: No acute traumatic findings ENT:      Nose: No congestion/rhinnorhea.      Mouth/Throat: Mucous membranes are moist. Neck: No stridor. No cervical spine tenderness to palpation. Cardiovascular:  Good peripheral perfusion Respiratory: Normal respiratory effort without tachypnea or retractions. Lungs CTAB. Good air entry to the bases with no decreased or absent breath sounds. Gastrointestinal: Bowel sounds 4 quadrants. Soft and nontender to palpation. No guarding or rigidity. No palpable masses. No  distention. No CVA tenderness. Musculoskeletal: Full range of motion to all extremities.  Neurologic:  No gross focal neurologic deficits are appreciated.  Skin:   No rash noted Other:   ED Results / Procedures / Treatments   Labs (all labs ordered are listed, but only abnormal results are displayed) Labs Reviewed  CBC - Abnormal; Notable for the following components:      Result Value   WBC 12.0 (*)    RBC 5.29 (*)    Hemoglobin 11.8 (*)    MCV 71.3 (*)    MCH 22.3 (*)    All other components within normal limits  HCG, QUANTITATIVE, PREGNANCY - Abnormal; Notable for the following components:   hCG, Beta Chain, Quant, S 135,002 (*)    All other components within normal limits  COMPREHENSIVE METABOLIC PANEL - Abnormal; Notable for the following components:   Sodium 132 (*)    All other components within normal limits  URINALYSIS, ROUTINE W REFLEX MICROSCOPIC - Abnormal; Notable for the following components:   Color, Urine YELLOW (*)    APPearance HAZY (*)    Leukocytes,Ua SMALL (*)    Bacteria, UA RARE (*)    All  other components within normal limits  POC URINE PREG, ED - Abnormal; Notable for the following components:   Preg Test, Ur POSITIVE (*)    All other components within normal limits  ABO/RH  ANTIBODY SCREEN  RHOGAM INJECTION        RADIOLOGY  I personally viewed and evaluated these images as part of my medical decision making, as well as reviewing the written report by the radiologist.  ED Provider Interpretation: Single viable intrauterine pregnancy at 150 bpm.   PROCEDURES:  Critical Care performed: No  Procedures   MEDICATIONS ORDERED IN ED: Medications  rho (d) immune globulin (RHIG/RHOPHYLAC) injection 300 mcg (300 mcg Intramuscular Given 03/13/22 1652)     IMPRESSION / MDM / [place] / ED COURSE  I reviewed the triage vital signs and the nursing notes.                              Assessment and plan Vaginal bleeding in  pregnancy 22 year old female presents to the emergency department with vaginal bleeding and cramping for 1 day.  Vital signs are reassuring at triage.  On exam, patient was alert and nontoxic-appearing.  Her abdomen was soft and nontender.  Patient had mild elevation in white blood cell count.  Beta-hCG elevated at 135002.   Dedicated OB ultrasound shows single viable intrauterine pregnancy at 150 bpm with tiny subchorionic hemorrhage.  Patient is blood type A- and patient received RhoGAM while in the emergency department.  I recommended following up with OB/GYN.  Return precautions were given to return with new or worsening symptoms.   FINAL CLINICAL IMPRESSION(S) / ED DIAGNOSES   Final diagnoses:  Vaginal bleeding in pregnancy     Rx / DC Orders   ED Discharge Orders     None        Note:  This document was prepared using Dragon voice recognition software and may include unintentional dictation errors.   Vallarie Mare [place], Hershal Coria 03/13/22 2306    Harvest Dark, MD 03/14/22 Doran Heater

## 2022-03-14 LAB — RHOGAM INJECTION: Unit division: 0

## 2022-03-21 DIAGNOSIS — Z3490 Encounter for supervision of normal pregnancy, unspecified, unspecified trimester: Secondary | ICD-10-CM | POA: Insufficient documentation

## 2022-03-21 DIAGNOSIS — O0993 Supervision of high risk pregnancy, unspecified, third trimester: Secondary | ICD-10-CM | POA: Insufficient documentation

## 2022-05-01 ENCOUNTER — Telehealth: Payer: Self-pay | Admitting: Licensed Clinical Social Worker

## 2022-05-01 NOTE — Telephone Encounter (Signed)
-----   Message from Dorthy Cooler, RN sent at 05/01/2022  8:47 AM EDT ----- Regarding: Referral Good morning Estill Bamberg, I hope that you are doing well. Please accept this as a referral for this client. Her initial EPDS assessment at her initial OB visit scroe was 20. She denies any thoughts of self harm. She reports long history of anxiety and depression. She was started on lexapro now at 15 mg per day. She denies any type of change in her anxiety or depression so far since she started her new medication. She is open to counseling services to go along with her medication therapy. Please see her, if she is in need of more intensive therapy please refer as needed.    Thank you   The correct number for her is [phone]

## 2022-05-20 ENCOUNTER — Ambulatory Visit: Payer: Medicaid Other | Admitting: Licensed Clinical Social Worker

## 2022-05-20 DIAGNOSIS — F331 Major depressive disorder, recurrent, moderate: Secondary | ICD-10-CM
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
## 2022-05-28 ENCOUNTER — Ambulatory Visit: Payer: Medicaid Other | Admitting: Licensed Clinical Social Worker

## 2022-05-28 DIAGNOSIS — F331 Major depressive disorder, recurrent, moderate: Secondary | ICD-10-CM

## 2022-05-28 NOTE — Progress Notes (Signed)
Counselor/Therapist Progress Note  Patient ID: Morgan Collier, MRN: [id number],    Date: 05/28/2022  Time Spent: 35 minutes    Treatment Type: Psychotherapy  Reported Symptoms:  Continued depressive symptoms and anxiety  Mental Status Exam:  Appearance:   Casual, Neat, and Well Groomed     Behavior:  Appropriate, Sharing, and responsive, minimal participation   Motor:  Normal  Speech/Language:   Clear and Coherent and Normal Rate  Affect:  Appropriate, Congruent, and Full Range  Mood:  normal  Thought process:  normal  Thought content:    WNL  Sensory/Perceptual disturbances:    WNL  Orientation:  oriented to person, place, time/date, situation, and day of week  Attention:  Good  Concentration:  Good  Memory:  WNL  Fund of knowledge:   Good  Insight:    Fair  Judgment:   Fair  Impulse Control:  Fair   Risk Assessment: Danger to Self:  No Self-injurious Behavior: No Danger to Others: No Duty to Warn:no Physical Aggression / Violence:No  Access to Firearms a concern: No  Gang Involvement:No   Subjective: Patient was engaged and cooperative throughout the session using time effectively to discuss thoughts, feelings, goal of treatment and treatment plan. Patient reports no changes in mood. Patient voiced agreement in assessing anxiety symptoms. In addition to GAD screen, patient endorsed Muscle tension, sleep disturbance - disrupted sleep throughout the night or waking every few hours. Patient voices motivation for treatment and understanding of CBTs.      05/28/2022   11:54 AM  GAD 7 : Generalized Anxiety Score  Nervous, Anxious, on Edge 2  Control/stop worrying 1  Worry too much - different things 2  Trouble relaxing 2  Restless 0  Easily annoyed or irritable 3  Afraid - awful might happen 2  Total GAD 7 Score 12  Anxiety Difficulty Somewhat difficult    Interventions: Cognitive Behavioral Therapy and Client Centered   Checked in with patient and reviewed previous  session, including assessment. Taught patient about CBTs, engaged her in developing/establishing a goal of treatment, and continued to develop rapport and understanding and worked collaboratively to develop CBTs treatment plan. LCSW implemented GAD and continued to assess anxiety symptoms for generalized anxiety disorder. Discussed medication management and encouraged patient to take as prescribed. Provided support through active listening, validation of feelings, and highlighted patient's strengths.   Diagnosis:   ICD-10-CM   1. Major depressive disorder, recurrent episode, moderate (HCC)  F33.1       Plan: Patient's goal of treatment is to learn to cope with everything and to try to have a positive outlook on everything, even te bad. And another goal is to learn how to express herself and open up more.  Treatment Target: Understand the relationship between thoughts, emotions, and behaviors  Psychoeducation on CBTs model   Oriented the client to the therapeutic approach Teach the connection between thoughts, emotions, and behaviors  Treatment Target: Increase realistic balanced thinking -to learn how to replace thinking with thoughts that are more accurate or helpful Explore patient's thoughts, beliefs, automatic thoughts, assumptions  Identify and replace unhelpful thinking patterns (upsetting ideas, self-talk and mental images) Process distress and allow for emotional release  Questioning and challenging thoughts Cognitive reappraisal  Restructuring, Socratic questioning  Treatment Target: Creating a life worth living  Values clarification   Self-care - nutrition, sleep, exercise  Mindfulness skills Interpersonal Effectiveness  Emotion Regulation Distress Tolerance Treatment Target: Increase emotional regulation  Coping skills to manage anxiety  Teach distress tolerance techniques - "what helps me"  Opposite action PLEASE  skills or self-care skills  Self-soothing  Cope ahead  skills - imagery, rehearsal, problem-solving, exposure   Assertiveness communication  Future Appointments  Date Time Provider Department Center  06/10/2022 12:20 PM Kathreen Cosier, LCSW [facility] None      Kathreen Cosier, LCSW

## 2022-06-10 ENCOUNTER — Ambulatory Visit: Payer: Medicaid Other | Admitting: Licensed Clinical Social Worker

## 2022-06-10 NOTE — Progress Notes (Unsigned)
Counselor/Therapist Progress Note  Patient ID: Morgan Collier, MRN: [id number],    Date: 06/10/2022  Time Spent: ***   Treatment Type: Psychotherapy  Reported Symptoms: {CHL AMB Reported Symptoms:[phone]}  Mental Status Exam:  Appearance:   {PSY:22683}     Behavior:  {PSY:[id number]}  Motor:  {PSY:22302}  Speech/Language:   {PSY:22685}  Affect:  {PSY:22687}  Mood:  {PSY:31886}  Thought process:  {PSY:31888}  Thought content:    {[phone]}  Sensory/Perceptual disturbances:    {PSY:[phone]}  Orientation:  {PSY:30297}  Attention:  {PSY:22877}  Concentration:  {PSY:925-539-4886}  Memory:  {[phone]}  Fund of knowledge:   {PSY:925-539-4886}  Insight:    {PSY:925-539-4886}  Judgment:   {PSY:925-539-4886}  Impulse Control:  {PSY:925-539-4886}   Risk Assessment: Danger to Self:  {PSY:22692} Self-injurious Behavior: {PSY:22692} Danger to Others: {PSY:22692} Duty to Warn:{PSY:311194} Physical Aggression / Violence:{PSY:21197} Access to Firearms a concern: {PSY:21197} Gang Involvement:{PSY:21197}  Subjective: Patient was engaged and cooperative throughout the session using time effectively to discuss   Patient voices continued motivation for treatment and understanding of  . Patient is likely to benefit from future treatment because  remains motivated to decrease  And   and reports benefit of regular sessions in addressing these symptoms.     Interventions: {[phone]} Checked in with patient regarding their week. LCSW assisted patient in processing their thoughts and emotions about what they have experienced in ... and ... with / at .... LCSW reviewed behavior modification, distress tolerance and effective communication skills with patient. Provided support through active listening, validation of feelings, and highlighted patient's strengths.     Diagnosis:   ICD-10-CM   1. Major depressive disorder, recurrent episode, moderate (HCC)  F33.1       Plan:  Patient's goal of treatment is to learn to cope with everything and to try to have a positive outlook on everything, even te bad. And another goal is to learn how to express herself and open up more.  Treatment Target: Understand the relationship between thoughts, emotions, and behaviors  Psychoeducation on CBTs model   Oriented the client to the therapeutic approach Teach the connection between thoughts, emotions, and behaviors  Treatment Target: Increase realistic balanced thinking -to learn how to replace thinking with thoughts that are more accurate or helpful Explore patient's thoughts, beliefs, automatic thoughts, assumptions  Identify and replace unhelpful thinking patterns (upsetting ideas, self-talk and mental images) Process distress and allow for emotional release  Questioning and challenging thoughts Cognitive reappraisal  Restructuring, Socratic questioning  Treatment Target: Creating a life worth living  Values clarification   Self-care - nutrition, sleep, exercise  Mindfulness skills Interpersonal Effectiveness  Emotion Regulation Distress Tolerance Treatment Target: Increase emotional regulation  Coping skills to manage anxiety   Teach distress tolerance techniques - "what helps me"  Opposite action PLEASE  skills or self-care skills  Self-soothing  Cope ahead skills - imagery, rehearsal, problem-solving, exposure   Assertiveness communication  Future Appointments  Date Time Provider Department Center  06/10/2022 12:20 PM Kathreen Cosier, LCSW [facility] None      Kathreen Cosier, LCSW

## 2022-06-16 ENCOUNTER — Ambulatory Visit: Payer: Managed Care, Other (non HMO) | Admitting: Dietician

## 2022-06-24 ENCOUNTER — Ambulatory Visit: Payer: Managed Care, Other (non HMO) | Admitting: Dietician

## 2022-06-24 ENCOUNTER — Observation Stay
Admission: EM | Admit: 2022-06-24 | Discharge: 2022-06-24 | Disposition: A | Discharge status: Home / Self Care | Payer: Managed Care, Other (non HMO) | Attending: Obstetrics | Admitting: Obstetrics

## 2022-06-24 ENCOUNTER — Other Ambulatory Visit: Payer: Self-pay

## 2022-06-24 ENCOUNTER — Encounter: Payer: Self-pay | Admitting: Obstetrics and Gynecology

## 2022-06-24 DIAGNOSIS — Z3A22 22 weeks gestation of pregnancy: Secondary | ICD-10-CM | POA: Diagnosis not present

## 2022-06-24 DIAGNOSIS — Z3490 Encounter for supervision of normal pregnancy, unspecified, unspecified trimester: Secondary | ICD-10-CM

## 2022-06-24 DIAGNOSIS — Z79899 Other long term (current) drug therapy: Secondary | ICD-10-CM | POA: Diagnosis not present

## 2022-06-24 DIAGNOSIS — O99512 Diseases of the respiratory system complicating pregnancy, second trimester: Secondary | ICD-10-CM | POA: Insufficient documentation

## 2022-06-24 DIAGNOSIS — O26892 Other specified pregnancy related conditions, second trimester: Secondary | ICD-10-CM | POA: Diagnosis present

## 2022-06-24 DIAGNOSIS — M545 Low back pain, unspecified: Secondary | ICD-10-CM | POA: Diagnosis not present

## 2022-06-24 DIAGNOSIS — J45909 Unspecified asthma, uncomplicated: Secondary | ICD-10-CM | POA: Insufficient documentation

## 2022-06-24 DIAGNOSIS — O99891 Other specified diseases and conditions complicating pregnancy: Secondary | ICD-10-CM | POA: Diagnosis not present

## 2022-06-24 LAB — URINALYSIS, COMPLETE (UACMP) WITH MICROSCOPIC
Bacteria, UA: NONE SEEN
Bilirubin Urine: NEGATIVE
Glucose, UA: 150 mg/dL — AB
Hgb urine dipstick: NEGATIVE
Ketones, ur: 5 mg/dL — AB
Leukocytes,Ua: NEGATIVE
Nitrite: NEGATIVE
Protein, ur: 30 mg/dL — AB
Specific Gravity, Urine: 1.033 — ABNORMAL HIGH (ref 1.005–1.030)
pH: 5 (ref 5.0–8.0)

## 2022-06-24 LAB — WET PREP, GENITAL
Clue Cells Wet Prep HPF POC: NONE SEEN
Sperm: NONE SEEN
Trich, Wet Prep: NONE SEEN
WBC, Wet Prep HPF POC: <10 (ref <10)
Yeast Wet Prep HPF POC: NONE SEEN

## 2022-06-24 NOTE — OB Triage Note (Signed)

## 2022-06-24 NOTE — Discharge Summary (Signed)
Morgan Collier is a 22 y.o. female. She is at Unknown gestation. Patient's last menstrual period was 01/19/2022 (exact date). Estimated Date of Delivery: None noted.  Prenatal care site: [facility]  Chief complaint: low back pain  HPI: Morgan Collier presents to L&D with complaints of low back pain in second trimester  Factors complicating pregnancy: Hx of c-section RH neg Hx of GDM Abnormal 1 hr gtt Varicella non-immune Asthma HSV 1 Depression and anxiety  S: Resting comfortably. no CTX, no VB.no LOF,  Active fetal movement.   Maternal Medical History:  Past Medical Hx:  has a past medical history of Asthma, First trimester screening, Gestational diabetes, Hidradenitis suppurativa of right axilla, History of anemia, Labor and delivery indication for care or intervention (08/02/2018), Labor and delivery, indication for care (08/02/2018), and Normal labor and delivery (08/02/2018).    Past Surgical Hx:  has a past surgical history that includes Wisdom tooth extraction (2016); Incision and drainage abscess (Right, 10/10/2015); and Cesarean section (N/A, 06/28/2019).   No Known Allergies   Prior to Admission medications   Medication Sig Start Date End Date Taking? Authorizing Provider  acetaminophen (TYLENOL) 500 MG tablet Take 2 tablets (1,000 mg total) by mouth every 6 (six) hours. 06/30/19  Yes Gustavo Lah, CNM  cetirizine (ZYRTEC) 10 MG tablet Take 10 mg by mouth daily as needed.  04/07/18 06/24/22 Yes [provider]  escitalopram (LEXAPRO) 10 MG tablet Take 10 mg by mouth daily.   Yes [provider]  fluticasone (FLONASE) 50 MCG/ACT nasal spray Place 1 spray into both nostrils daily as needed.  03/25/18  Yes [provider]  Prenatal Vit-Fe Fumarate-FA (PRENATAL MULTIVITAMIN) TABS tablet Take 1 tablet by mouth daily at 12 noon.   Yes [provider]  PROAIR HFA 108 (90 Base) MCG/ACT inhaler Inhale 2 puffs into the lungs every 6 (six) hours as  needed. 10/26/15  Yes [provider]  chlorhexidine (HIBICLENS) 4 % external liquid Apply topically daily as needed. 03/28/19   McVey, Prudencio Pair, CNM  FLOVENT HFA 44 MCG/ACT inhaler  04/30/19   [provider]  ibuprofen (ADVIL) 800 MG tablet Take 1 tablet (800 mg total) by mouth every 6 (six) hours. 06/30/19   Gustavo Lah, CNM  senna-docusate (SENOKOT-S) 8.6-50 MG tablet Take 2 tablets by mouth daily. 07/01/19   Gustavo Lah, CNM  valACYclovir (VALTREX) 1000 MG tablet Take 1,000 mg by mouth 2 (two) times daily. PRN for onset symptoms    [provider]    Social History: She  reports that she has never smoked. She has never been exposed to tobacco smoke. She has never used smokeless tobacco. She reports current alcohol use. She reports that she does not currently use drugs after having used the following drugs: Marijuana.  Family History: family history includes ADD / ADHD in her father; Anemia in her daughter; Cirrhosis in her maternal grandfather; Diabetes in her paternal grandmother; Eczema in her son; Heart disease in her maternal grandmother; Hypertension in her maternal grandmother and paternal grandmother; Throat cancer in her maternal uncle. ,no history of gyn cancers  Review of Systems: A full review of systems was performed and negative except as noted in the HPI.    O:  BP 124/66 (BP Location: Left Arm)   Pulse (!) 115   Temp 97.8 F (36.6 C) (Oral)   Resp 19   Ht 5\' 2"  (1.575 m)   Wt 77.1 kg   LMP 01/19/2022 (Exact Date)  BMI 31.09 kg/m  Results for orders placed or performed during the hospital encounter of 06/24/22 (from the past 48 hour(s))  Wet prep, genital   Collection Time: 06/24/22  3:36 PM  Result Value Ref Range   Yeast Wet Prep HPF POC NONE SEEN NONE SEEN   Trich, Wet Prep NONE SEEN NONE SEEN   Clue Cells Wet Prep HPF POC NONE SEEN NONE SEEN   WBC, Wet Prep HPF POC <10 <10   Sperm NONE SEEN   Urinalysis, Complete w Microscopic -Urine,  Clean Catch   Collection Time: 06/24/22  3:36 PM  Result Value Ref Range   Color, Urine YELLOW (A) YELLOW   APPearance HAZY (A) CLEAR   Specific Gravity, Urine 1.033 (H) 1.005 - 1.030   pH 5.0 5.0 - 8.0   Glucose, UA 150 (A) NEGATIVE mg/dL   Hgb urine dipstick NEGATIVE NEGATIVE   Bilirubin Urine NEGATIVE NEGATIVE   Ketones, ur 5 (A) NEGATIVE mg/dL   Protein, ur 30 (A) NEGATIVE mg/dL   Nitrite NEGATIVE NEGATIVE   Leukocytes,Ua NEGATIVE NEGATIVE   RBC / HPF 0-5 0 - 5 RBC/hpf   WBC, UA 0-5 0 - 5 WBC/hpf   Bacteria, UA NONE SEEN NONE SEEN   Squamous Epithelial / HPF 0-5 0 - 5 /HPF   Mucus PRESENT      Constitutional: NAD, AAOx3  HE/ENT: extraocular movements grossly intact, moist mucous membranes CV: RRR PULM: nl respiratory effort, CTABL Abd: gravid, non-tender, non-distended, soft  Ext: Non-tender, Nonedmeatous Psych: mood appropriate, speech normal Pelvic : deferred SVE:     Fetal Monitor: doppler: 150's bpm    Assessment: 22 y.o. Unknown here for antenatal surveillance during pregnancy.  Principle diagnosis:  There were no encounter diagnoses.   Plan: Labor: not present.  Fetal Wellbeing: doppler 150's Wet Prep- neg UA neg Encouraged oral hydration Tylenol 1000 mg Q 6 PRN D/c home stable, precautions reviewed, follow-up as scheduled.   ----- Chari Manning, CNM Certified Nurse Midwife [facility]

## 2022-06-25 ENCOUNTER — Ambulatory Visit: Payer: Medicaid Other | Admitting: Licensed Clinical Social Worker

## 2022-06-25 DIAGNOSIS — F331 Major depressive disorder, recurrent, moderate: Secondary | ICD-10-CM

## 2022-06-25 NOTE — Progress Notes (Signed)
Counselor/Therapist Progress Note  Patient ID: Morgan Collier, MRN: [id number],    Date: 06/25/2022  Time Spent: 45 minutes   Treatment Type: Psychotherapy  Reported Symptoms:  Mood has been better, some "mood swings"   Mental Status Exam:  Appearance:   Casual, Neat, and Well Groomed     Behavior:  Appropriate, Sharing, and Motivated  Motor:  Normal  Speech/Language:   Clear and Coherent and Normal Rate  Affect:  Appropriate, Congruent, and Full Range  Mood:  normal  Thought process:  normal  Thought content:    WNL  Sensory/Perceptual disturbances:    WNL  Orientation:  oriented to person, place, time/date, and situation  Attention:  Good  Concentration:  Good  Memory:  WNL  Fund of knowledge:   Good  Insight:    Fair  Judgment:   Fair  Impulse Control:  Fair   Risk Assessment: Danger to Self:  No Self-injurious Behavior: No Danger to Others: No Duty to Warn:no Physical Aggression / Violence:No  Access to Firearms a concern: No  Gang Involvement:No   Subjective: Patient was engaged and cooperative throughout the session using time effectively to discuss thoughts and feelings. Patient reports that her mood has been better, but she still struggles with "mood swings" because she thinks too much. Patient voices continued motivation for treatment and understanding of CBTs.   Interventions: Cognitive Behavioral Therapy and Client Centered  Established psychological safety. Checked in with patient regarding their week and current symptoms. LCSW processed with the patient how they have been doing since the last follow-up session. LCSW explored patient's unresolved anger issues related to previous cheating in relationship with current partner, encouraging patient to process thoughts and emotions providing supportive space and validation of her feelings of anger, while also highlighting thoughts and feelings. LCSW taught patient about radical acceptance, discussed journaling,  noticing the connection to thoughts, emotions and physical sensations, and discussed increasing communication and showing affection. Provided support through active listening, validation of feelings, and highlighted patient's strengths.    Diagnosis:   ICD-10-CM   1. Major depressive disorder, recurrent episode, moderate (HCC)  F33.1      Plan: Check in about: discarding pictures; communication and affection.  Patient's goal of treatment is to learn to cope with everything and to try to have a positive outlook on everything, even te bad. And another goal is to learn how to express herself and open up more.  Treatment Target: Understand the relationship between thoughts, emotions, and behaviors  Psychoeducation on CBTs model   Oriented the client to the therapeutic approach Teach the connection between thoughts, emotions, and behaviors  Treatment Target: Increase realistic balanced thinking -to learn how to replace thinking with thoughts that are more accurate or helpful Explore patient's thoughts, beliefs, automatic thoughts, assumptions  Identify and replace unhelpful thinking patterns (upsetting ideas, self-talk and mental images) Process distress and allow for emotional release  Questioning and challenging thoughts Cognitive reappraisal  Restructuring, Socratic questioning  Treatment Target: Creating a life worth living  Values clarification   Self-care - nutrition, sleep, exercise  Mindfulness skills Interpersonal Effectiveness  Emotion Regulation Distress Tolerance Treatment Target: Increase emotional regulation  Coping skills to manage anxiety   Teach distress tolerance techniques - "what helps me"  Opposite action PLEASE  skills or self-care skills  Self-soothing  Cope ahead skills - imagery, rehearsal, problem-solving, exposure   Assertiveness communication  Future Appointments  Date Time Provider Department Center  06/26/2022 10:00 AM Altamese Cabal, RD  [facility] None   07/02/2022  5:10 PM Kathreen Cosier, LCSW [facility] None      Kathreen Cosier, LCSW

## 2022-06-26 ENCOUNTER — Encounter: Payer: Self-pay | Admitting: Dietician

## 2022-06-26 ENCOUNTER — Encounter: Payer: Managed Care, Other (non HMO) | Attending: Certified Nurse Midwife | Admitting: Dietician

## 2022-06-26 DIAGNOSIS — O24419 Gestational diabetes mellitus in pregnancy, unspecified control: Secondary | ICD-10-CM | POA: Diagnosis not present

## 2022-06-26 DIAGNOSIS — Z3A22 22 weeks gestation of pregnancy: Secondary | ICD-10-CM | POA: Insufficient documentation

## 2022-06-26 DIAGNOSIS — Z713 Dietary counseling and surveillance: Secondary | ICD-10-CM | POA: Diagnosis not present

## 2022-06-26 NOTE — Patient Instructions (Addendum)
Be sure to eat breakfast every day!  Test your blood glucose 2 hours after STARTING a meal!  When testing blood sugar, prick on the side of the finger, near the tip beside your finger nail to minimize discomfort.  Dispose of used lancets in a hard plastic container. Once full, cap the container, duct tape the cap shut, and dispose of it in the regular trash.  When feeling shaky or sweaty, immediately check your blood sugar. If it is below 60 mg/dL, treat it with the "Rule of 15", have a balanced snack after your blood sugar returns to normal.  If having a soda, choose a Pepsi ZERO. Continue to work on lowering your soda consumption!!

## 2022-06-26 NOTE — Progress Notes (Signed)
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 Pt reports starting a 2000 kcal diet as of today, also stated their OBGYN will be prescribing insulin BID today, had appointment with OB prior to this visit. Pt reports two previous pregnancies in which they developed GDM, received prior education during their last pregnancy, Pt reports concerns for the future potential to develop diabetes. Pt reports frequent stress related to work, and being a single mother, states their family helps out with child care. Pt reports skipping breakfast most days, will eat a large lunch an dinner. Pt states they get shaky before lunchtime, concerned it may be related to hypoglycemia. Pt states they will eat something and symptoms go away.  Physical Activity: No exercise, very active at work Stress: Higher, work related, two small kids  24 hr Recall:  First Meal: No breakfast Snack: Second meal: Nachos w/ ground beef, cheese, lettuce, tomato, sour cream, 1 sugar cookie Snack:3 sugar cookies Third meal: Hibachi steak and chicken, onions, zucchini, broccoli, side salad, extra rice,  Snack: Beverages: Water, soda  NUTRITION INTERVENTION  Nutrition education (E-1) on the following topics:   Initial Follow-up  [x]  []  Definition of Gestational Diabetes [x]  []  Why dietary management is important in controlling blood glucose [x]  []  Effects each nutrient has on blood glucose levels [x]  []  Simple carbohydrates vs complex carbohydrates [x]  []  Fluid intake [x]  []  Creating a balanced meal plan []  []  Carbohydrate  counting  [x]  []  When to check blood glucose levels [x]  []  Proper blood glucose monitoring techniques [x]  []  Effect of stress and stress reduction techniques  [x]  []  Exercise effect on blood glucose levels, appropriate exercise during pregnancy []  []  Importance of limiting caffeine and abstaining from alcohol and smoking [x]  []  Medications used for blood sugar control during pregnancy [x]  []  Hypoglycemia and rule of 15 []  [x]  Postpartum self care   Patient has a meter prior to visit (Accu-Chek) Patient is testing pre breakfast and 2 hours after each meal. FBS: ~ 86 - 90 Postprandial: ~150 - 170  Patient instructed to monitor glucose levels: FBS: 60 - ? 95 mg/dL; 2 hour: ? 161 mg/dL  Patient received handouts: Nutrition Diabetes and Pregnancy Carbohydrate Counting List  Patient will be seen for follow-up as needed.

## 2022-06-30 ENCOUNTER — Ambulatory Visit: Payer: Managed Care, Other (non HMO) | Admitting: Dietician

## 2022-07-02 ENCOUNTER — Ambulatory Visit: Payer: Medicaid Other | Admitting: Licensed Clinical Social Worker

## 2022-07-02 DIAGNOSIS — F331 Major depressive disorder, recurrent, moderate: Secondary | ICD-10-CM

## 2022-07-02 NOTE — Progress Notes (Signed)
Counselor/Therapist Progress Note  Patient ID: Morgan Collier, MRN: [id number],    Date: 07/02/2022  Time Spent: 45 minutes    Treatment Type: Psychotherapy  Reported Symptoms:  feeling okay, neutral, not sad, overwhelmed   Mental Status Exam:  Appearance:   Casual     Behavior:  Appropriate, Sharing, and Motivated  Motor:  Normal  Speech/Language:   Clear and Coherent and Normal Rate  Affect:  Appropriate, Congruent, Full Range, and Tearful  Mood:  normal  Thought process:  normal  Thought content:    WNL  Sensory/Perceptual disturbances:    WNL  Orientation:  oriented to person, place, time/date, and situation  Attention:  Good  Concentration:  Good  Memory:  WNL  Fund of knowledge:   Good  Insight:    Fair  Judgment:   Fair  Impulse Control:  Fair   Risk Assessment: Danger to Self:  No Self-injurious Behavior: No Danger to Others: No Duty to Warn:no Physical Aggression / Violence:No  Access to Firearms a concern: No  Gang Involvement:No   Subjective: Patient was receptive to feedback and intervention from LCSW and actively and effectively participated throughout the session. Patient reports that she has been overwhelmed lately and worries that she is not being a good mom and worries about upcoming delivery and having the things she needs for the baby. Patient is likely to benefit from future treatment because she remains motivated to decrease symptoms.     Interventions: Cognitive Behavioral Therapy and Client Centered  Established psychological safety. Checked in with patient regarding their week. LCSW processed with the patient how they have been doing since the last follow-up session. LCSW assisted patient in processing their thoughts and emotions regarding feeling like a bad mom. LCSW reframed thoughts, challenged thoughts, identified patient's values of being a mom and behaviors that align with her values, provided brief TP parenting coaching, and encouraged patient  to use guided meditation before bed. Provided support through active listening, validation of feelings, and highlighted patient's strengths.   Diagnosis:   ICD-10-CM   1. Major depressive disorder, recurrent episode, moderate (HCC)  F33.1       Plan: NEXT session teach patient about mindfulness. Check in about previous session: discarding pictures; communication and affection.  Patient's goal of treatment is to learn to cope with everything and to try to have a positive outlook on everything, even te bad. And another goal is to learn how to express herself and open up more.  Treatment Target: Understand the relationship between thoughts, emotions, and behaviors  Psychoeducation on CBTs model   Oriented the client to the therapeutic approach Teach the connection between thoughts, emotions, and behaviors  Treatment Target: Increase realistic balanced thinking -to learn how to replace thinking with thoughts that are more accurate or helpful Explore patient's thoughts, beliefs, automatic thoughts, assumptions  Identify and replace unhelpful thinking patterns (upsetting ideas, self-talk and mental images) Process distress and allow for emotional release  Questioning and challenging thoughts Cognitive reappraisal  Restructuring, Socratic questioning  Treatment Target: Creating a life worth living  Values clarification   Self-care - nutrition, sleep, exercise  Mindfulness skills Interpersonal Effectiveness  Emotion Regulation Distress Tolerance Treatment Target: Increase emotional regulation  Coping skills to manage anxiety   Teach distress tolerance techniques - "what helps me"  Opposite action PLEASE  skills or self-care skills  Self-soothing  Cope ahead skills - imagery, rehearsal, problem-solving, exposure   Assertiveness communication  Future Appointments  Date Time Provider Department Center  07/17/2022  5:10 PM Kathreen Cosier, LCSW [facility] None     Kathreen Cosier, LCSW

## 2022-07-17 ENCOUNTER — Ambulatory Visit: Payer: Medicaid Other | Admitting: Licensed Clinical Social Worker

## 2022-07-17 NOTE — Progress Notes (Unsigned)
Counselor/Therapist Progress Note  Patient ID: Morgan Collier, MRN: [id number],    Date: 07/17/2022  Time Spent: ***   Treatment Type: {CHL AMB THERAPY TYPES:[id number]}  Reported Symptoms: {CHL AMB Reported Symptoms:[phone]}  Mental Status Exam:  Appearance:   {PSY:22683}     Behavior:  {PSY:21022743}  Motor:  {PSY:22302}  Speech/Language:   {PSY:22685}  Affect:  {PSY:22687}  Mood:  {PSY:31886}  Thought process:  {PSY:31888}  Thought content:    {[phone]}  Sensory/Perceptual disturbances:    {[phone]}  Orientation:  {PSY:30297}  Attention:  {PSY:22877}  Concentration:  {[phone]}  Memory:  {[phone]}  Fund of knowledge:   {[phone]}  Insight:    {[phone]}  Judgment:   {[phone]}  Impulse Control:  {[phone]}   Risk Assessment: Danger to Self:  No Self-injurious Behavior: No Danger to Others: No Duty to Warn:no Physical Aggression / Violence:No  Access to Firearms a concern: No  Gang Involvement:No   Subjective: Patient was engaged and cooperative throughout the session using time effectively to discuss   Patient voices continued motivation for treatment and understanding of  . Patient is likely to benefit from future treatment because  remains motivated to decrease  And   and reports benefit of regular sessions in addressing these symptoms.   Interventions: Cognitive Behavioral Therapy and Client Centered  Established psychological safety. Checked in with patient regarding their week. LCSW processed with the patient how they have been doing since the last follow-up session. LCSW assisted patient in processing their thoughts and emotions regarding *** LCSW provided psychoeducation and therapeutic intervention regarding coping with *** Provided support through active listening, validation of feelings, and highlighted patient's strengths.  Provided Psychoeducation on mindfulness, engaged patient in mindfulness  exercise, processed exercise, and contracted with patient to complete daily.    Diagnosis:   ICD-10-CM   1. Major depressive disorder, recurrent episode, moderate (HCC)  F33.1       Plan: NEXT session teach patient about mindfulness. Check in about previous session: discarding pictures; communication and affection.  Patient's goal of treatment is to learn to cope with everything and to try to have a positive outlook on everything, even te bad. And another goal is to learn how to express herself and open up more.  Treatment Target: Understand the relationship between thoughts, emotions, and behaviors  Psychoeducation on CBTs model   Oriented the client to the therapeutic approach Teach the connection between thoughts, emotions, and behaviors  Treatment Target: Increase realistic balanced thinking -to learn how to replace thinking with thoughts that are more accurate or helpful Explore patient's thoughts, beliefs, automatic thoughts, assumptions  Identify and replace unhelpful thinking patterns (upsetting ideas, self-talk and mental images) Process distress and allow for emotional release  Questioning and challenging thoughts Cognitive reappraisal  Restructuring, Socratic questioning  Treatment Target: Creating a life worth living  Values clarification   Self-care - nutrition, sleep, exercise  Mindfulness skills Interpersonal Effectiveness  Emotion Regulation Distress Tolerance Treatment Target: Increase emotional regulation  Coping skills to manage anxiety   Teach distress tolerance techniques - "what helps me"  Opposite action PLEASE  skills or self-care skills  Self-soothing  Cope ahead skills - imagery, rehearsal, problem-solving, exposure   Assertiveness communication  Future Appointments  Date Time Provider Department Center  07/17/2022  5:10 PM Kathreen Cosier, LCSW [facility] None    Kathreen Cosier, LCSW

## 2022-07-24 ENCOUNTER — Ambulatory Visit: Payer: Medicaid Other | Admitting: Licensed Clinical Social Worker

## 2022-07-24 DIAGNOSIS — F331 Major depressive disorder, recurrent, moderate: Secondary | ICD-10-CM

## 2022-07-24 NOTE — Progress Notes (Signed)
Counselor/Therapist Progress Note  Patient ID: Morgan Collier, MRN: [id number],    Date: 07/24/2022  Time Spent: 40 minutes   Treatment Type: Psychotherapy  Reported Symptoms:  Intermittent low mood, irritability,overwhelm  Mental Status Exam:  Appearance:   Casual and Neat     Behavior:  Appropriate, Sharing, and Motivated  Motor:  Normal  Speech/Language:   Clear and Coherent and Normal Rate  Affect:  Appropriate, Congruent, and Full Range  Mood:  normal  Thought process:  normal  Thought content:    WNL  Sensory/Perceptual disturbances:    WNL  Orientation:  oriented to person, place, time/date, situation, and day of week  Attention:  Good  Concentration:  Good  Memory:  WNL  Fund of knowledge:   Good  Insight:    Fair  Judgment:   Fair  Impulse Control:  Fair   Risk Assessment: Danger to Self:  No Self-injurious Behavior: No Danger to Others: No Duty to Warn:no Physical Aggression / Violence:No  Access to Firearms a concern: No  Gang Involvement:No   Subjective: Patient was receptive to feedback and intervention from LCSW and actively and effectively participated throughout the session. Patient reports she has been "okay", she complains of feeling irritable and overwhelmed at times. Patient is likely to benefit from future treatment because she remains motivated to decrease symptoms and reports benefit of regular sessions.  Interventions: Cognitive Behavioral Therapy, Mindfulness Meditation, and Client Centered  Established psychological safety. Checked in with patient regarding their week and discussed current symptoms. Validated patient's experience of irritability and overwhelm and discussed these symptoms presenting like anxiety; LCSW will continue to assess. LCSW provided Psychoeducation on mindfulness, engaged patient in mindfulness exercise, processed exercise, contracted with patient to complete daily and emailed patient guided mindfulness meditation links.  Provided support through active listening, validation of feelings, and highlighted patient's strengths.    Diagnosis:   ICD-10-CM   1. Major depressive disorder, recurrent episode, moderate (HCC)  F33.1      Plan:  NEXT session discuss use of mindfulness.  Patient's goal of treatment is to learn to cope with everything and to try to have a positive outlook on everything, even te bad. And another goal is to learn how to express herself and open up more.  Treatment Target: Understand the relationship between thoughts, emotions, and behaviors  Psychoeducation on CBTs model   Oriented the client to the therapeutic approach Teach the connection between thoughts, emotions, and behaviors  Treatment Target: Increase realistic balanced thinking -to learn how to replace thinking with thoughts that are more accurate or helpful Explore patient's thoughts, beliefs, automatic thoughts, assumptions  Identify and replace unhelpful thinking patterns (upsetting ideas, self-talk and mental images) Process distress and allow for emotional release  Questioning and challenging thoughts Cognitive reappraisal  Restructuring, Socratic questioning  Treatment Target: Creating a life worth living  Values clarification   Self-care - nutrition, sleep, exercise  Mindfulness skills Interpersonal Effectiveness  Emotion Regulation Distress Tolerance Treatment Target: Increase emotional regulation  Coping skills to manage anxiety   Teach distress tolerance techniques - "what helps me"  Opposite action PLEASE  skills or self-care skills  Self-soothing  Cope ahead skills - imagery, rehearsal, problem-solving, exposure   Assertiveness communication  Future Appointments  Date Time Provider Department Center  08/14/2022  5:10 PM Kathreen Cosier, LCSW [facility] None    Kathreen Cosier, LCSW

## 2022-07-30 DIAGNOSIS — O0992 Supervision of high risk pregnancy, unspecified, second trimester: Secondary | ICD-10-CM | POA: Diagnosis not present

## 2022-07-30 DIAGNOSIS — N898 Other specified noninflammatory disorders of vagina: Secondary | ICD-10-CM | POA: Diagnosis not present

## 2022-08-01 DIAGNOSIS — O0992 Supervision of high risk pregnancy, unspecified, second trimester: Secondary | ICD-10-CM | POA: Diagnosis not present

## 2022-08-01 DIAGNOSIS — N898 Other specified noninflammatory disorders of vagina: Secondary | ICD-10-CM | POA: Diagnosis not present

## 2022-08-04 DIAGNOSIS — O99713 Diseases of the skin and subcutaneous tissue complicating pregnancy, third trimester: Secondary | ICD-10-CM | POA: Diagnosis not present

## 2022-08-04 DIAGNOSIS — L299 Pruritus, unspecified: Secondary | ICD-10-CM | POA: Diagnosis not present

## 2022-08-06 DIAGNOSIS — O26893 Other specified pregnancy related conditions, third trimester: Secondary | ICD-10-CM | POA: Diagnosis not present

## 2022-08-06 DIAGNOSIS — Z6791 Unspecified blood type, Rh negative: Secondary | ICD-10-CM | POA: Diagnosis not present

## 2022-08-14 ENCOUNTER — Ambulatory Visit: Payer: Medicaid Other | Admitting: Licensed Clinical Social Worker

## 2022-08-14 DIAGNOSIS — F331 Major depressive disorder, recurrent, moderate: Secondary | ICD-10-CM

## 2022-08-14 NOTE — Progress Notes (Signed)
Counselor/Therapist Progress Note  Patient ID: Morgan Collier, MRN: [id number],    Date: 08/14/2022  Time Spent: 45 minutes    Treatment Type: Psychotherapy  Reported Symptoms:  "okay", been anxious and worrying, sleep disturbance-waking in the night and difficulties falling back to sleep    Mental Status Exam:  Appearance:   Casual and Neat     Behavior:  Appropriate, Sharing, and Motivated  Motor:  Normal  Speech/Language:   Clear and Coherent and Normal Rate  Affect:  Appropriate, Congruent, and Full Range  Mood:  normal  Thought process:  normal  Thought content:    WNL  Sensory/Perceptual disturbances:    WNL  Orientation:  oriented to person, place, time/date, situation, and day of week  Attention:  Good  Concentration:  Good  Memory:  WNL  Fund of knowledge:   Good  Insight:    Fair  Judgment:   Fair  Impulse Control:  Fair   Risk Assessment: Danger to Self:  No Self-injurious Behavior: No Danger to Others: No Duty to Warn:no Physical Aggression / Violence:No  Access to Firearms a concern: No  Gang Involvement:No   Subjective: Patient was receptive to feedback and intervention from LCSW and actively and effectively participated throughout the session. Patient reports that things at work have been stressful this week, but otherwise she has been "okay". She also reports having a lot of anxiety related to life changes with up coming arrival of baby. Patient reports that she tried two of the mindfulness links, but struggled with them. Patient remains motivated for treatment.   Interventions: Cognitive Behavioral Therapy, Motivational Interviewing, and Client Centered  Checked in with patient regarding their week. LCSW processed with the patient how they have been doing since the last follow-up session. LCSW assisted patient in processing their thoughts and emotions regarding stress related to work and related to upcoming arrival of new baby LCSW used MI to discussed  medication management with patient, providing her with appropriate patient education and encouraging her to consider taking as prescribed prior to delivery. Patient voices agreement with restarting lexapro. LCSW reviewed previous session homework task of practicing mindfulness meditation via guided meditations, explored barriers and challenges and provided clarifying information and encouraged patient to use each evening. Provided support through active listening, validation of feelings, and highlighted patient's strengths.    Diagnosis:   ICD-10-CM   1. Major depressive disorder, recurrent episode, moderate (HCC)  F33.1      Plan: NEXT session discuss use of mindfulness.  Patient's goal of treatment is to learn to cope with everything and to try to have a positive outlook on everything, even te bad. And another goal is to learn how to express herself and open up more.  Treatment Target: Understand the relationship between thoughts, emotions, and behaviors  Psychoeducation on CBTs model   Oriented the client to the therapeutic approach Teach the connection between thoughts, emotions, and behaviors  Treatment Target: Increase realistic balanced thinking -to learn how to replace thinking with thoughts that are more accurate or helpful Explore patient's thoughts, beliefs, automatic thoughts, assumptions  Identify and replace unhelpful thinking patterns (upsetting ideas, self-talk and mental images) Process distress and allow for emotional release  Questioning and challenging thoughts Cognitive reappraisal  Restructuring, Socratic questioning  Treatment Target: Creating a life worth living  Values clarification   Self-care - nutrition, sleep, exercise  Mindfulness skills Interpersonal Effectiveness  Emotion Regulation Distress Tolerance Treatment Target: Increase emotional regulation  Coping skills to manage anxiety  Teach distress tolerance techniques - "what helps me"  Opposite  action PLEASE  skills or self-care skills  Self-soothing  Cope ahead skills - imagery, rehearsal, problem-solving, exposure   Assertiveness communication  Future Appointments  Date Time Provider Department Center  08/27/2022  5:10 PM Kathreen Cosier, LCSW [facility] None    Kathreen Cosier, LCSW

## 2022-08-15 DIAGNOSIS — O4443 Low lying placenta NOS or without hemorrhage, third trimester: Secondary | ICD-10-CM | POA: Diagnosis not present

## 2022-08-19 ENCOUNTER — Other Ambulatory Visit: Payer: Self-pay | Admitting: Obstetrics

## 2022-08-19 ENCOUNTER — Encounter: Payer: Self-pay | Admitting: Obstetrics

## 2022-08-19 DIAGNOSIS — O24419 Gestational diabetes mellitus in pregnancy, unspecified control: Secondary | ICD-10-CM

## 2022-08-20 ENCOUNTER — Other Ambulatory Visit: Payer: Self-pay | Admitting: Obstetrics

## 2022-08-20 ENCOUNTER — Inpatient Hospital Stay
Admission: EM | Admit: 2022-08-20 | Discharge: 2022-08-21 | Disposition: A | Discharge status: Home / Self Care | Payer: Managed Care, Other (non HMO) | Attending: Obstetrics and Gynecology | Admitting: Obstetrics and Gynecology

## 2022-08-20 ENCOUNTER — Inpatient Hospital Stay
Admission: RE | Admit: 2022-08-20 | Discharge: 2022-08-20 | Disposition: A | Discharge status: Home / Self Care | Payer: Managed Care, Other (non HMO) | Source: Ambulatory Visit | Attending: Obstetrics | Admitting: Obstetrics

## 2022-08-20 ENCOUNTER — Encounter: Payer: Self-pay | Admitting: Emergency Medicine

## 2022-08-20 ENCOUNTER — Emergency Department: Payer: Managed Care, Other (non HMO)

## 2022-08-20 ENCOUNTER — Other Ambulatory Visit: Payer: Self-pay

## 2022-08-20 DIAGNOSIS — O24419 Gestational diabetes mellitus in pregnancy, unspecified control: Secondary | ICD-10-CM | POA: Insufficient documentation

## 2022-08-20 DIAGNOSIS — Z6791 Unspecified blood type, Rh negative: Secondary | ICD-10-CM | POA: Diagnosis not present

## 2022-08-20 DIAGNOSIS — E871 Hypo-osmolality and hyponatremia: Secondary | ICD-10-CM | POA: Diagnosis not present

## 2022-08-20 DIAGNOSIS — Z3689 Encounter for other specified antenatal screening: Secondary | ICD-10-CM

## 2022-08-20 DIAGNOSIS — Z3A30 30 weeks gestation of pregnancy: Secondary | ICD-10-CM | POA: Insufficient documentation

## 2022-08-20 DIAGNOSIS — O26893 Other specified pregnancy related conditions, third trimester: Secondary | ICD-10-CM | POA: Insufficient documentation

## 2022-08-20 DIAGNOSIS — A6000 Herpesviral infection of urogenital system, unspecified: Secondary | ICD-10-CM | POA: Insufficient documentation

## 2022-08-20 DIAGNOSIS — U071 COVID-19: Secondary | ICD-10-CM | POA: Diagnosis not present

## 2022-08-20 DIAGNOSIS — O98313 Other infections with a predominantly sexual mode of transmission complicating pregnancy, third trimester: Secondary | ICD-10-CM | POA: Diagnosis not present

## 2022-08-20 DIAGNOSIS — O99283 Endocrine, nutritional and metabolic diseases complicating pregnancy, third trimester: Secondary | ICD-10-CM | POA: Diagnosis not present

## 2022-08-20 DIAGNOSIS — O98513 Other viral diseases complicating pregnancy, third trimester: Secondary | ICD-10-CM | POA: Insufficient documentation

## 2022-08-20 DIAGNOSIS — O34219 Maternal care for unspecified type scar from previous cesarean delivery: Secondary | ICD-10-CM | POA: Diagnosis not present

## 2022-08-20 DIAGNOSIS — R000 Tachycardia, unspecified: Secondary | ICD-10-CM | POA: Diagnosis not present

## 2022-08-20 LAB — COMPREHENSIVE METABOLIC PANEL
ALT: 13 U/L (ref 0–44)
AST: 20 U/L (ref 15–41)
Albumin: 2.9 g/dL — ABNORMAL LOW (ref 3.5–5.0)
Alkaline Phosphatase: 56 U/L (ref 38–126)
Anion gap: 8 (ref 5–15)
BUN: 5 mg/dL — ABNORMAL LOW (ref 6–20)
CO2: 19 mmol/L — ABNORMAL LOW (ref 22–32)
Calcium: 8.6 mg/dL — ABNORMAL LOW (ref 8.9–10.3)
Chloride: 104 mmol/L (ref 98–111)
Creatinine, Ser: 0.55 mg/dL (ref 0.44–1.00)
GFR, Estimated: >60 mL/min (ref >60)
Glucose, Bld: 180 mg/dL — ABNORMAL HIGH (ref 70–99)
Potassium: 3.5 mmol/L (ref 3.5–5.1)
Sodium: 131 mmol/L — ABNORMAL LOW (ref 135–145)
Total Bilirubin: 0.6 mg/dL (ref 0.3–1.2)
Total Protein: 6.9 g/dL (ref 6.5–8.1)

## 2022-08-20 LAB — CBC WITH DIFFERENTIAL/PLATELET
Abs Immature Granulocytes: 0.08 10*3/uL — ABNORMAL HIGH (ref 0.00–0.07)
Basophils Absolute: 0 10*3/uL (ref 0.0–0.1)
Basophils Relative: 0 %
Eosinophils Absolute: 0 10*3/uL (ref 0.0–0.5)
Eosinophils Relative: 0 %
HCT: 29.8 % — ABNORMAL LOW (ref 36.0–46.0)
Hemoglobin: 9.5 g/dL — ABNORMAL LOW (ref 12.0–15.0)
Immature Granulocytes: 1 %
Lymphocytes Relative: 10 %
Lymphs Abs: 1.1 10*3/uL (ref 0.7–4.0)
MCH: 21.8 pg — ABNORMAL LOW (ref 26.0–34.0)
MCHC: 31.9 g/dL (ref 30.0–36.0)
MCV: 68.3 fL — ABNORMAL LOW (ref 80.0–100.0)
Monocytes Absolute: 0.8 10*3/uL (ref 0.1–1.0)
Monocytes Relative: 7 %
Neutro Abs: 9.2 10*3/uL — ABNORMAL HIGH (ref 1.7–7.7)
Neutrophils Relative %: 82 %
Platelets: 260 10*3/uL (ref 150–400)
RBC: 4.36 MIL/uL (ref 3.87–5.11)
RDW: 15.8 % — ABNORMAL HIGH (ref 11.5–15.5)
Smear Review: NORMAL
WBC: 11.3 10*3/uL — ABNORMAL HIGH (ref 4.0–10.5)
nRBC: 0.2 % (ref 0.0–0.2)

## 2022-08-20 LAB — URINALYSIS, ROUTINE W REFLEX MICROSCOPIC
Bilirubin Urine: NEGATIVE
Glucose, UA: >=500 mg/dL — AB
Hgb urine dipstick: NEGATIVE
Ketones, ur: NEGATIVE mg/dL
Leukocytes,Ua: NEGATIVE
Nitrite: NEGATIVE
Protein, ur: NEGATIVE mg/dL
Specific Gravity, Urine: 1.011 (ref 1.005–1.030)
pH: 6 (ref 5.0–8.0)

## 2022-08-20 LAB — RESP PANEL BY RT-PCR (RSV, FLU A&B, COVID)  RVPGX2
Influenza A by PCR: NEGATIVE
Influenza B by PCR: NEGATIVE
Resp Syncytial Virus by PCR: NEGATIVE
SARS Coronavirus 2 by RT PCR: POSITIVE — AB

## 2022-08-20 LAB — TROPONIN I (HIGH SENSITIVITY): Troponin I (High Sensitivity): 3 ng/L (ref <18)

## 2022-08-20 LAB — LIPASE, BLOOD: Lipase: 27 U/L (ref 11–51)

## 2022-08-20 MED ORDER — ACETAMINOPHEN 325 MG PO TABS
650.0000 mg | ORAL_TABLET | ORAL | Status: DC | PRN
  Administered 2022-08-21: 650 mg via ORAL
  Filled 2022-08-20: qty 2

## 2022-08-20 MED ORDER — ONDANSETRON HCL 4 MG/2ML IJ SOLN: 4.0000 mg | Freq: Four times a day (QID) | INTRAMUSCULAR | Status: DC | PRN

## 2022-08-20 MED ORDER — SODIUM CHLORIDE 0.9 % IV BOLUS
1000.0000 mL | Freq: Once | INTRAVENOUS | Status: AC
  Administered 2022-08-20: 1000 mL via INTRAVENOUS

## 2022-08-20 MED ORDER — ESCITALOPRAM OXALATE 10 MG PO TABS
10.0000 mg | ORAL_TABLET | Freq: Every day | ORAL | Status: DC
  Administered 2022-08-21: 10 mg via ORAL
  Filled 2022-08-20: qty 1

## 2022-08-20 MED ORDER — LACTATED RINGERS IV SOLN: INTRAVENOUS | Status: DC

## 2022-08-20 MED ORDER — ACETAMINOPHEN 325 MG PO TABS
650.0000 mg | ORAL_TABLET | Freq: Once | ORAL | Status: AC
  Administered 2022-08-20: 650 mg via ORAL
  Filled 2022-08-20: qty 2

## 2022-08-20 NOTE — ED Provider Notes (Signed)
[facility] Provider Note  Patient Contact: 10:15 PM (approximate)   History   Nasal Congestion   HPI  Morgan Collier is a 22 y.o. female G4, P2 at [redacted] weeks pregnant, presents to the emergency department with flulike symptoms for the past 2 to 3 days.  Patient reports nasal congestion, rhinorrhea, cough and bodyaches.  Patient states that she is experienced some lower pelvic pressure tonight and became concerned.  No gush of vaginal fluid, cramping sensation or vaginal bleeding.  No chest pain, chest tightness or vomiting at home.      Physical Exam   Triage Vital Signs: ED Triage Vitals  Encounter Vitals Group     BP 08/20/22 2158 113/66     Systolic BP Percentile --      Diastolic BP Percentile --      Pulse Rate 08/20/22 2158 100     Resp 08/20/22 2158 18     Temp 08/20/22 2158 99.9 F (37.7 C)     Temp Source 08/20/22 2158 Oral     SpO2 08/20/22 2158 99 %     Weight 08/20/22 2153 172 lb 6.4 oz (78.2 kg)     Height 08/20/22 2153 5\' 2"  (1.575 m)     Head Circumference --      Peak Flow --      Pain Score 08/20/22 2153 0     Pain Loc --      Pain Education --      Exclude from Growth Chart --     Most recent vital signs: Vitals:   08/20/22 2158 08/20/22 2223  BP: 113/66   Pulse: (!) 146 (!) 142  Resp: 18   Temp: 99.9 F (37.7 C) (!) 100.4 F (38 C)  SpO2: 99%      General: Alert and in no acute distress. Eyes:  PERRL. EOMI. Head: No acute traumatic findings ENT:      Nose: No congestion/rhinnorhea.      Mouth/Throat: Mucous membranes are moist.  Neck: No stridor. No cervical spine tenderness to palpation. Cardiovascular:  Good peripheral perfusion Respiratory: Normal respiratory effort without tachypnea or retractions. Lungs CTAB. Good air entry to the bases with no decreased or absent breath sounds. Gastrointestinal: Bowel sounds 4 quadrants. Soft and nontender to palpation. No guarding or rigidity. No palpable masses. No  distention. No CVA tenderness. Musculoskeletal: Full range of motion to all extremities.  Neurologic:  No gross focal neurologic deficits are appreciated.  Skin:   No rash noted    ED Results / Procedures / Treatments   Labs (all labs ordered are listed, but only abnormal results are displayed) Labs Reviewed  RESP PANEL BY RT-PCR (RSV, FLU A&B, COVID)  [id number] - Abnormal; Notable for the following components:      Result Value   SARS Coronavirus 2 by RT PCR POSITIVE (*)    All other components within normal limits  CBC WITH DIFFERENTIAL/PLATELET - Abnormal; Notable for the following components:   WBC 11.3 (*)    Hemoglobin 9.5 (*)    HCT 29.8 (*)    MCV 68.3 (*)    MCH 21.8 (*)    RDW 15.8 (*)    Neutro Abs 9.2 (*)    Abs Immature Granulocytes 0.08 (*)    All other components within normal limits  COMPREHENSIVE METABOLIC PANEL - Abnormal; Notable for the following components:   Sodium 131 (*)    CO2 19 (*)    Glucose, Bld 180 (*)  BUN 5 (*)    Calcium 8.6 (*)    Albumin 2.9 (*)    All other components within normal limits  URINALYSIS, ROUTINE W REFLEX MICROSCOPIC - Abnormal; Notable for the following components:   Color, Urine STRAW (*)    APPearance CLEAR (*)    Glucose, UA >=500 (*)    Bacteria, UA FEW (*)    All other components within normal limits  LIPASE, BLOOD  TROPONIN I (HIGH SENSITIVITY)     EKG  Sinus tachycardia with no ST segment elevation or other apparent arrhythmia.   RADIOLOGY  I personally viewed and evaluated these images as part of my medical decision making, as well as reviewing the written report by the radiologist.  ED Provider Interpretation: Chest x-ray unremarkable.   PROCEDURES:  Critical Care performed: No  Procedures   MEDICATIONS ORDERED IN ED: Medications  sodium chloride 0.9 % bolus 1,000 mL (1,000 mLs Intravenous New Bag/Given 08/20/22 2242)  acetaminophen (TYLENOL) tablet 650 mg (650 mg Oral Given 08/20/22 2300)      IMPRESSION / MDM / ASSESSMENT AND PLAN / ED COURSE  I reviewed the triage vital signs and the nursing notes.                              Assessment and plan: Fever:   22 year old female G4, P2, presents to the emergency department 30 weeks with history of gestational diabetes with low-grade fever and tachycardia.  Patient endorses flulike symptoms for the past 2 to 3 days including nasal congestion, cough and bodyaches.  She is also had some lower pelvic pressure.  Patient was tachycardic at 145 at triage with low-grade fever.  Vital signs otherwise reassuring.  On exam, patient was alert and nontoxic-appearing.  She was able to provide her own historical information.  Patient tested positive for COVID-19.  CMP indicates mild hyponatremia.  CBC largely reassuring.  Troponin within range.  Lipase within range.  I discussed patient case with certified nurse midwife, Haroldine Laws who anticipates admitting patient for dehydration and observation.   FINAL CLINICAL IMPRESSION(S) / ED DIAGNOSES   Final diagnoses:  COVID-19     Rx / DC Orders   ED Discharge Orders     None        Note:  This document was prepared using Dragon voice recognition software and may include unintentional dictation errors.   Pia Mau [place], Cordelia Poche 08/20/22 2327    Minna Antis, MD 08/21/22 2253

## 2022-08-20 NOTE — ED Triage Notes (Signed)
Pt presents ambulatory to triage via POV with complaints of nasal congestion x 3 days. Pt is 30 weeks G4P2 and is endorsing pelvic pressure. A&Ox4 at this time. Denies CP or SOB.    L&D notified and will see patient after the non-obstetric complaint is addressed.

## 2022-08-20 NOTE — H&P (Signed)
HISTORY AND PHYSICAL NOTE  History of Present Illness: Morgan Collier is a 22 y.o. [id number] at [redacted]w[redacted]d admitted for {Reason for Admission:21615}.  She presented to L&D with complaints of ***.    Reports ***active fetal movement  Contractions: {Contraction frequency:24211} LOF/SROM: *** Vaginal bleeding: *** Fetal presentation is {desc; fetal presentation:14558}.  Factors complicating pregnancy:  ***  Prenatal care site:  {Prenatal care sites:24209}  Patient Active Problem List   Diagnosis Date Noted   COVID-19 affecting pregnancy in third trimester 08/20/2022   Pregnancy 06/24/2022   Major depressive disorder, recurrent episode, moderate (HCC) 05/20/2022   Supervision of normal pregnancy 03/21/2022   Overweight BMI=27.4 07/09/2020   Gestational diabetes x2 07/09/2020   IBS (irritable bowel syndrome) 07/09/2020   Depression affecting pregnancy, postpartum 07/09/2020   Sepsis (HCC) 03/27/2019   Hypokalemia 03/27/2019   Asthma 03/27/2019   Hidradenitis suppurativa of right axilla 10/28/2016    Past Medical History:  Diagnosis Date   Asthma    Seasonal   First trimester screening    Gestational diabetes    Hidradenitis suppurativa of right axilla    History of anemia    Labor and delivery indication for care or intervention 08/02/2018   Labor and delivery, indication for care 08/02/2018   Normal labor and delivery 08/02/2018    Past Surgical History:  Procedure Laterality Date   CESAREAN SECTION N/A 06/28/2019   Procedure: CESAREAN SECTION;  Surgeon: Christeen Douglas, MD;  Location: [facility] ORS;  Service: Obstetrics;  Laterality: N/A;   INCISION AND DRAINAGE ABSCESS Right 10/10/2015   Procedure: Incision and drainage of complex axillary abscess Excisions of complex hidradenitis on right axilla                            3. Excisional debridement of skin subcutaneous tissue;  Surgeon: Leafy Ro, MD;  Location: [facility];  Service: General;  Laterality: Right;    WISDOM TOOTH EXTRACTION  2016    OB History  Gravida Para Term Preterm AB Living  4 2 2     2   SAB IAB Ectopic Multiple Live Births        0 2    # Outcome Date GA Lbr Len/2nd Weight Sex Type Anes PTL Lv  4 Current           3 Term 06/28/19 [redacted]w[redacted]d  3260 g F CS-LTranv EPI, Spinal  LIV  2 Term 08/03/18 [redacted]w[redacted]d  3260 g M Vag-Spont  N LIV  1 Gravida             Social History:  reports that she has never smoked. She has never been exposed to tobacco smoke. She has never used smokeless tobacco. She reports current alcohol use. She reports that she does not currently use drugs after having used the following drugs: Marijuana.  Family History: family history includes ADD / ADHD in her father; Anemia in her daughter; Cirrhosis in her maternal grandfather; Diabetes in her paternal grandmother; Eczema in her son; Heart disease in her maternal grandmother; Hypertension in her maternal grandmother and paternal grandmother; Throat cancer in her maternal uncle.  No Known Allergies  (Not in a hospital admission)   ROS  Physical Examination: Vitals:  BP 113/66 (BP Location: Left Arm)   Pulse (!) 142   Temp (!) 100.4 F (38 C) (Oral)   Resp 18   Ht 5\' 2"  (1.575 m)   Wt 78.2 kg   LMP  01/19/2022 (Exact Date)   SpO2 99%   BMI 31.53 kg/m  General: no acute distress.  HEENT: normocephalic, atraumatic Heart: regular rate & rhythm.  No murmurs/rubs/gallops Lungs: clear to auscultation bilaterally, normal respiratory effort Abdomen: soft, gravid, non-tender;  EFW: *** Pelvic:   External: Normal external female genitalia  Cervix:   /   /      Extremities: non-tender, symmetric, *** edema bilaterally.  DTRs: ***  Neurologic: Alert & oriented x 3.    Membranes: {:314523} FHT:  {findings; monitor fetal heart monitor:21620:::0} Category/reactivity:  {CHL FWB PLAN:21624:::0} UC:   {obgyn contractions reg/irreg:312982:::0}  Labs:  Results for orders placed or performed during the hospital  encounter of 08/20/22 (from the past 24 hour(s))  Resp panel by RT-PCR (RSV, Flu A&B, Covid) Anterior Nasal Swab   Collection Time: 08/20/22  9:57 PM   Specimen: Anterior Nasal Swab  Result Value Ref Range   SARS Coronavirus 2 by RT PCR POSITIVE (A) NEGATIVE   Influenza A by PCR NEGATIVE NEGATIVE   Influenza B by PCR NEGATIVE NEGATIVE   Resp Syncytial Virus by PCR NEGATIVE NEGATIVE  Urinalysis, Routine w reflex microscopic -Urine, Clean Catch   Collection Time: 08/20/22 10:05 PM  Result Value Ref Range   Color, Urine STRAW (A) YELLOW   APPearance CLEAR (A) CLEAR   Specific Gravity, Urine 1.011 1.005 - 1.030   pH 6.0 5.0 - 8.0   Glucose, UA >=500 (A) NEGATIVE mg/dL   Hgb urine dipstick NEGATIVE NEGATIVE   Bilirubin Urine NEGATIVE NEGATIVE   Ketones, ur NEGATIVE NEGATIVE mg/dL   Protein, ur NEGATIVE NEGATIVE mg/dL   Nitrite NEGATIVE NEGATIVE   Leukocytes,Ua NEGATIVE NEGATIVE   RBC / HPF 0-5 0 - 5 RBC/hpf   WBC, UA 0-5 0 - 5 WBC/hpf   Bacteria, UA FEW (A) NONE SEEN   Squamous Epithelial / HPF 0-5 0 - 5 /HPF  CBC with Differential   Collection Time: 08/20/22 10:30 PM  Result Value Ref Range   WBC 11.3 (H) 4.0 - 10.5 K/uL   RBC 4.36 3.87 - 5.11 MIL/uL   Hemoglobin 9.5 (L) 12.0 - 15.0 g/dL   HCT 54.0 (L) 98.1 - 19.1 %   MCV 68.3 (L) 80.0 - 100.0 fL   MCH 21.8 (L) 26.0 - 34.0 pg   MCHC 31.9 30.0 - 36.0 g/dL   RDW 47.8 (H) 29.5 - 62.1 %   Platelets 260 150 - 400 K/uL   nRBC 0.2 0.0 - 0.2 %   Neutrophils Relative % 82 %   Neutro Abs 9.2 (H) 1.7 - 7.7 K/uL   Lymphocytes Relative 10 %   Lymphs Abs 1.1 0.7 - 4.0 K/uL   Monocytes Relative 7 %   Monocytes Absolute 0.8 0.1 - 1.0 K/uL   Eosinophils Relative 0 %   Eosinophils Absolute 0.0 0.0 - 0.5 K/uL   Basophils Relative 0 %   Basophils Absolute 0.0 0.0 - 0.1 K/uL   WBC Morphology MORPHOLOGY UNREMARKABLE    RBC Morphology MIXED RBC MORPHOLOGY    Smear Review Normal platelet morphology    Immature Granulocytes 1 %   Abs  Immature Granulocytes 0.08 (H) 0.00 - 0.07 K/uL  Comprehensive metabolic panel   Collection Time: 08/20/22 10:30 PM  Result Value Ref Range   Sodium 131 (L) 135 - 145 mmol/L   Potassium 3.5 3.5 - 5.1 mmol/L   Chloride 104 98 - 111 mmol/L   CO2 19 (L) 22 - 32 mmol/L   Glucose, Bld 180 (  H) 70 - 99 mg/dL   BUN 5 (L) 6 - 20 mg/dL   Creatinine, Ser 7.25 0.44 - 1.00 mg/dL   Calcium 8.6 (L) 8.9 - 10.3 mg/dL   Total Protein 6.9 6.5 - 8.1 g/dL   Albumin 2.9 (L) 3.5 - 5.0 g/dL   AST 20 15 - 41 U/L   ALT 13 0 - 44 U/L   Alkaline Phosphatase 56 38 - 126 U/L   Total Bilirubin 0.6 0.3 - 1.2 mg/dL   GFR, Estimated >36 >64 mL/min   Anion gap 8 5 - 15  Lipase, blood   Collection Time: 08/20/22 10:30 PM  Result Value Ref Range   Lipase 27 11 - 51 U/L  Troponin I (High Sensitivity)   Collection Time: 08/20/22 10:30 PM  Result Value Ref Range   Troponin I (High Sensitivity) 3 <18 ng/L    Prenatal Labs: Blood type/Rh ***  Antibody screen neg  Rubella ***Immune  Varicella ***Immune  RPR NR  HBsAg Neg  Hep C NR  HIV NR  GC neg  Chlamydia neg  Genetic screening cfDNA negative ***  1 hour GTT ***  3 hour GTT ***  GBS ***    Imaging Studies: DG Chest 2 View  Result Date: 08/20/2022 CLINICAL DATA:  Cough EXAM: CHEST - 2 VIEW COMPARISON:  07/14/2021 FINDINGS: Heart and mediastinal contours are within normal limits. No focal opacities or effusions. No acute bony abnormality. Moderate convex rightward scoliosis in the thoracic spine. IMPRESSION: No active cardiopulmonary disease. Electronically Signed   By: Charlett Nose M.D.   On: 08/20/2022 23:06    Assessment and Plan: Patient Active Problem List   Diagnosis Date Noted   COVID-19 affecting pregnancy in third trimester 08/20/2022   Pregnancy 06/24/2022   Major depressive disorder, recurrent episode, moderate (HCC) 05/20/2022   Supervision of normal pregnancy 03/21/2022   Overweight BMI=27.4 07/09/2020   Gestational diabetes x2  07/09/2020   IBS (irritable bowel syndrome) 07/09/2020   Depression affecting pregnancy, postpartum 07/09/2020   Sepsis (HCC) 03/27/2019   Hypokalemia 03/27/2019   Asthma 03/27/2019   Hidradenitis suppurativa of right axilla 10/28/2016    1. Admit to Antenatal  -Routine antenatal care -NICU consult ***  Cyril Mourning, CNM  Certified Nurse Midwife [facility]

## 2022-08-21 ENCOUNTER — Encounter: Payer: Self-pay | Admitting: Obstetrics and Gynecology

## 2022-08-21 ENCOUNTER — Other Ambulatory Visit: Payer: Self-pay

## 2022-08-21 DIAGNOSIS — U071 COVID-19: Secondary | ICD-10-CM | POA: Diagnosis not present

## 2022-08-21 DIAGNOSIS — O98513 Other viral diseases complicating pregnancy, third trimester: Secondary | ICD-10-CM | POA: Diagnosis not present

## 2022-08-21 DIAGNOSIS — O99413 Diseases of the circulatory system complicating pregnancy, third trimester: Secondary | ICD-10-CM | POA: Diagnosis not present

## 2022-08-21 DIAGNOSIS — Z3A30 30 weeks gestation of pregnancy: Secondary | ICD-10-CM | POA: Diagnosis not present

## 2022-08-21 DIAGNOSIS — O99513 Diseases of the respiratory system complicating pregnancy, third trimester: Secondary | ICD-10-CM | POA: Diagnosis not present

## 2022-08-21 LAB — COMPREHENSIVE METABOLIC PANEL
ALT: 14 U/L (ref 0–44)
AST: 16 U/L (ref 15–41)
Albumin: 2.8 g/dL — ABNORMAL LOW (ref 3.5–5.0)
Alkaline Phosphatase: 56 U/L (ref 38–126)
BUN: <5 mg/dL — ABNORMAL LOW (ref 6–20)
CO2: 22 mmol/L (ref 22–32)
Calcium: 8.5 mg/dL — ABNORMAL LOW (ref 8.9–10.3)
Chloride: 107 mmol/L (ref 98–111)
Creatinine, Ser: 0.45 mg/dL (ref 0.44–1.00)
GFR, Estimated: >60 mL/min (ref >60)
Glucose, Bld: 116 mg/dL — ABNORMAL HIGH (ref 70–99)
Potassium: 3.6 mmol/L (ref 3.5–5.1)
Sodium: 132 mmol/L — ABNORMAL LOW (ref 135–145)
Total Bilirubin: 0.7 mg/dL (ref 0.3–1.2)
Total Protein: 6.8 g/dL (ref 6.5–8.1)

## 2022-08-21 LAB — CBC
HCT: 30.5 % — ABNORMAL LOW (ref 36.0–46.0)
Hemoglobin: 9.7 g/dL — ABNORMAL LOW (ref 12.0–15.0)
MCH: 21.6 pg — ABNORMAL LOW (ref 26.0–34.0)
MCHC: 31.8 g/dL (ref 30.0–36.0)
MCV: 67.8 fL — ABNORMAL LOW (ref 80.0–100.0)
Platelets: 260 10*3/uL (ref 150–400)
RBC: 4.5 MIL/uL (ref 3.87–5.11)
RDW: 15.7 % — ABNORMAL HIGH (ref 11.5–15.5)
WBC: 11.8 10*3/uL — ABNORMAL HIGH (ref 4.0–10.5)
nRBC: 0 % (ref 0.0–0.2)

## 2022-08-21 LAB — TROPONIN I (HIGH SENSITIVITY): Troponin I (High Sensitivity): 3 ng/L (ref <18)

## 2022-08-21 LAB — GLUCOSE, CAPILLARY
Glucose-Capillary: 109 mg/dL — ABNORMAL HIGH (ref 70–99)
Glucose-Capillary: 125 mg/dL — ABNORMAL HIGH (ref 70–99)

## 2022-08-21 MED ORDER — MENTHOL 3 MG MT LOZG
1.0000 | LOZENGE | OROMUCOSAL | Status: DC | PRN
  Administered 2022-08-21: 3 mg via ORAL
  Filled 2022-08-21: qty 9

## 2022-08-21 MED ORDER — NIRMATRELVIR/RITONAVIR (PAXLOVID) TABLET (RENAL DOSING): 2.0000 | ORAL_TABLET | Freq: Two times a day (BID) | ORAL | 0 refills | Status: AC

## 2022-08-21 NOTE — OB Triage Note (Signed)
22 y.o G4P2 presents to L&D triage at [redacted]w[redacted]d for COVID-related symptoms. Pt is being discharged home by [facility]. Pt reports feeling much better than when she initially arrived last night and denies headache. Vitals are stable and fetal strip has been reactive. Discharge instructions reviewed including Paxlovid dosing. Pt was told to return for SOB, decreased fetal movement, vaginal bleeding, ctx's, or lof and pt verbalized understanding.

## 2022-08-21 NOTE — Progress Notes (Signed)
Patient's Blood Glucose at 0053 is 125. Glucometer not flowing into flowsheet.

## 2022-08-21 NOTE — OB Triage Note (Signed)
Morgan Collier 22 y.o. a [id number] at 30wk4d presents to Labor & Delivery triage via wheelchair steered by ED staff for monitoring of COVID during pregnancy.  She denies signs and symptoms consistent with rupture of membranes or active vaginal bleeding. She reports mild cramping and states positive fetal movement, but less movement than normal. Patient reports calf pain at nighttime for the past 2wks, pelvic pressure during coughing spells, and a mild headache. Patient reports ED staff administered Tylenol appx 1hr ago. External FM and TOCO applied to non-tender abdomen. Initial FHR 165. Vital signs obtained and temperature and pulse are noted to be elevated. Patient oriented to care environment including call bell and bed control use. [place], CNM notified of patient's arrival. Plan to observe.

## 2022-08-27 ENCOUNTER — Ambulatory Visit: Payer: Medicaid Other | Admitting: Licensed Clinical Social Worker

## 2022-08-27 NOTE — Progress Notes (Unsigned)
Counselor/Therapist Progress Note  Patient ID: Morgan Collier, MRN: [id number],    Date: 08/27/2022  Time Spent: ***   Treatment Type: Psychotherapy  Reported Symptoms: {CHL AMB Reported [phone]}  Mental Status Exam:  Appearance:   {PSY:22683}     Behavior:  {PSY:21022743}  Motor:  {PSY:22302}  Speech/Language:   {PSY:22685}  Affect:  {PSY:22687}  Mood:  {PSY:31886}  Thought process:  {PSY:31888}  Thought content:    {PSY:[id number]}  Sensory/Perceptual disturbances:    {PSY:507-676-1604}  Orientation:  {PSY:30297}  Attention:  {PSY:22877}  Concentration:  {PSY:501-075-0969}  Memory:  {PSY:567-077-7873}  Fund of knowledge:   {PSY:501-075-0969}  Insight:    {PSY:501-075-0969}  Judgment:   {PSY:501-075-0969}  Impulse Control:  {PSY:501-075-0969}   Risk Assessment: Danger to Self:  No Self-injurious Behavior: No Danger to Others: No Duty to Warn:no Physical Aggression / Violence:No  Access to Firearms a concern: No  Gang Involvement:No   Subjective: Patient was receptive to feedback and intervention from LCSW and actively and effectively participated throughout the session. Patient is likely to benefit from future treatment because  remains motivated to decrease  And   and reports benefit of regular sessions in addressing these symptoms.      Interventions: Cognitive Behavioral Therapy and Motivational Interviewing  Established psychological safety. Checked in with patient regarding their week. Clinician met with patient to identify needs related to stressors and functioning, and assess and monitor for signs and symptoms of and , and assess safety. The clinician processed with the patient how they have been doing since the last follow-up session. LCSW assisted patient in processing their emotions about what they have experienced in ... and ... with / at .... LCSW reviewed behavior modification, distress tolerance and effective communication skills with patient. Provided support  through active listening, validation of feelings, and highlighted patient's strengths.    Diagnosis:   ICD-10-CM   1. Major depressive disorder, recurrent episode, moderate (HCC)  F33.1      Plan: NEXT session discuss use of mindfulness.  Patient's goal of treatment is to learn to cope with everything and to try to have a positive outlook on everything, even te bad. And another goal is to learn how to express herself and open up more.  Treatment Target: Understand the relationship between thoughts, emotions, and behaviors  Psychoeducation on CBTs model   Oriented the client to the therapeutic approach Teach the connection between thoughts, emotions, and behaviors  Treatment Target: Increase realistic balanced thinking -to learn how to replace thinking with thoughts that are more accurate or helpful Explore patient's thoughts, beliefs, automatic thoughts, assumptions  Identify and replace unhelpful thinking patterns (upsetting ideas, self-talk and mental images) Process distress and allow for emotional release  Questioning and challenging thoughts Cognitive reappraisal  Restructuring, Socratic questioning  Treatment Target: Creating a life worth living  Values clarification   Self-care - nutrition, sleep, exercise  Mindfulness skills Interpersonal Effectiveness  Emotion Regulation Distress Tolerance Treatment Target: Increase emotional regulation  Coping skills to manage anxiety   Teach distress tolerance techniques - "what helps me"  Opposite action PLEASE  skills or self-care skills  Self-soothing  Cope ahead skills - imagery, rehearsal, problem-solving, exposure   Assertiveness communication  Future Appointments  Date Time Provider Department Center  08/27/2022  5:10 PM Kathreen Cosier, LCSW [facility] None    Kathreen Cosier, LCSW

## 2022-09-04 ENCOUNTER — Ambulatory Visit: Payer: Medicaid Other | Admitting: Licensed Clinical Social Worker

## 2022-09-09 DIAGNOSIS — O0993 Supervision of high risk pregnancy, unspecified, third trimester: Secondary | ICD-10-CM | POA: Diagnosis not present

## 2022-09-10 ENCOUNTER — Ambulatory Visit: Payer: Medicaid Other | Admitting: Licensed Clinical Social Worker

## 2022-09-10 DIAGNOSIS — F331 Major depressive disorder, recurrent, moderate: Secondary | ICD-10-CM

## 2022-09-10 NOTE — Progress Notes (Signed)
Counselor/Therapist Progress Note  Patient ID: Morgan Collier, MRN: [id number],    Date: 09/10/2022  Time Spent: 40 minutes    Treatment Type: Psychotherapy  Reported Symptoms:  "okay", mild anhedonia, hasn't been sad; worry a lot, anxiety, sleep disturbance     Mental Status Exam:  Appearance:   Casual and Neat     Behavior:  Appropriate, Sharing, and Motivated  Motor:  Normal  Speech/Language:   Normal Rate  Affect:  Appropriate, Congruent, and Full Range  Mood:  normal  Thought process:  normal  Thought content:    WNL  Sensory/Perceptual disturbances:    WNL  Orientation:  oriented to person, place, time/date, situation, and day of week  Attention:  Good  Concentration:  Good  Memory:  WNL  Fund of knowledge:   Good  Insight:    Fair  Judgment:   Fair  Impulse Control:  Fair   Risk Assessment: Danger to Self:  No Self-injurious Behavior: No Danger to Others: No Duty to Warn:no Physical Aggression / Violence:No  Access to Firearms a concern: No  Gang Involvement:No   Subjective: Patient was engaged and cooperative throughout the session using time effectively to discuss thoughts and feelings. She reports that she has been less sad and depressed, not crying, feeling okay- not good or bad, some happiness with her children, lots of worries and feeling anxious. Patient was receptive to feedback and intervention from LCSW.    Interventions: Cognitive Behavioral Therapy and Client Centered  Checked in with patient regarding how they have neem since last session. LCSW provided psychoeducation on sleep hygiene and discussed healthy eating tips. LCSW and patient also discussed relationship stressors and continuing to seek support from her partner. Provided support through active listening, validation of feelings, and highlighted patient's strengths.    Sleep hygiene  For good "sleep hygiene," try to incorporate the following into your daily routine:   Sleep only long  enough to feel rested and then get out of bed. Go to bed and get up at the same time every day. This leads to more regular sleep schedules.  Do not try to force yourself to sleep. If you can't sleep, get out of bed and try again later. Have coffee, tea, and other foods that have caffeine only in the morning, and avoid caffeine after lunch time.  Avoid alcohol in the late afternoon, evening, and bedtime. Avoid smoking, especially in the evening. Nicotine is a stimulant and could keep you awake. Keep your bedroom dark, cool, quiet, and free of reminders of work or other things that cause you stress. Using white noise, earplugs, blackout shades, and eye masks could be useful.  Try to solve problems you have before you go to bed. Exercise several days a week, but not right before bed. Avoid looking at phones or reading devices ("e-books") that give off light before bed. This can make it harder to fall asleep.  Other things that can improve sleep include: Relaxation therapy, in which you focus on relaxing all the muscles in your body 1 by 1 Working with a counselor or psychologist to deal with the problems that might be causing poor sleep   Diagnosis:   ICD-10-CM   1. Major depressive disorder, recurrent episode, moderate (HCC)  F33.1      Plan: Patient's goal of treatment is to learn to cope with everything and to try to have a positive outlook on everything, even te bad. And another goal is to learn how to express  herself and open up more.  Treatment Target: Understand the relationship between thoughts, emotions, and behaviors  Psychoeducation on CBTs model   Oriented the client to the therapeutic approach Teach the connection between thoughts, emotions, and behaviors  Treatment Target: Increase realistic balanced thinking -to learn how to replace thinking with thoughts that are more accurate or helpful Explore patient's thoughts, beliefs, automatic thoughts, assumptions  Identify and replace  unhelpful thinking patterns (upsetting ideas, self-talk and mental images) Process distress and allow for emotional release  Questioning and challenging thoughts Cognitive reappraisal  Restructuring, Socratic questioning  Treatment Target: Creating a life worth living  Values clarification   Self-care - nutrition, sleep, exercise  Mindfulness skills Interpersonal Effectiveness  Emotion Regulation Distress Tolerance Treatment Target: Increase emotional regulation  Coping skills to manage anxiety   Teach distress tolerance techniques - "what helps me"  Opposite action PLEASE  skills or self-care skills  Self-soothing  Cope ahead skills - imagery, rehearsal, problem-solving, exposure   Assertiveness communication  Future Appointments  Date Time Provider Department Center  09/25/2022  5:10 PM Kathreen Cosier, LCSW [facility] None     Kathreen Cosier, LCSW

## 2022-09-17 ENCOUNTER — Observation Stay
Admission: EM | Admit: 2022-09-17 | Discharge: 2022-09-17 | Disposition: A | Discharge status: Home / Self Care | Payer: Managed Care, Other (non HMO) | Attending: Obstetrics and Gynecology | Admitting: Obstetrics and Gynecology

## 2022-09-17 DIAGNOSIS — J45909 Unspecified asthma, uncomplicated: Secondary | ICD-10-CM | POA: Insufficient documentation

## 2022-09-17 DIAGNOSIS — O9A213 Injury, poisoning and certain other consequences of external causes complicating pregnancy, third trimester: Secondary | ICD-10-CM | POA: Diagnosis not present

## 2022-09-17 DIAGNOSIS — O24419 Gestational diabetes mellitus in pregnancy, unspecified control: Secondary | ICD-10-CM | POA: Insufficient documentation

## 2022-09-17 DIAGNOSIS — Z3A34 34 weeks gestation of pregnancy: Secondary | ICD-10-CM | POA: Diagnosis not present

## 2022-09-17 DIAGNOSIS — O99513 Diseases of the respiratory system complicating pregnancy, third trimester: Secondary | ICD-10-CM | POA: Diagnosis not present

## 2022-09-17 DIAGNOSIS — Y92009 Unspecified place in unspecified non-institutional (private) residence as the place of occurrence of the external cause: Secondary | ICD-10-CM

## 2022-09-17 DIAGNOSIS — S79919A Unspecified injury of unspecified hip, initial encounter: Secondary | ICD-10-CM | POA: Diagnosis not present

## 2022-09-17 DIAGNOSIS — W19XXXA Unspecified fall, initial encounter: Secondary | ICD-10-CM | POA: Diagnosis not present

## 2022-09-17 LAB — TYPE AND SCREEN
ABO/RH(D): A NEG
Antibody Screen: NEGATIVE

## 2022-09-17 LAB — FETAL SCREEN: Fetal Screen: NEGATIVE

## 2022-09-17 MED ORDER — ACETAMINOPHEN 500 MG PO TABS
1000.0000 mg | ORAL_TABLET | Freq: Four times a day (QID) | ORAL | Status: DC | PRN
  Administered 2022-09-17: 1000 mg via ORAL
  Filled 2022-09-17: qty 2

## 2022-09-17 MED ORDER — RHO D IMMUNE GLOBULIN 1500 UNIT/2ML IJ SOSY
300.0000 ug | PREFILLED_SYRINGE | Freq: Once | INTRAMUSCULAR | Status: AC
  Administered 2022-09-17: 300 ug via INTRAMUSCULAR
  Filled 2022-09-17: qty 2

## 2022-09-17 MED ORDER — ACETAMINOPHEN 500 MG PO TABS: 1000.0000 mg | ORAL_TABLET | Freq: Four times a day (QID) | ORAL | Status: DC | PRN

## 2022-09-17 MED ORDER — LACTATED RINGERS IV BOLUS
1000.0000 mL | Freq: Once | INTRAVENOUS | Status: AC
  Administered 2022-09-17: 1000 mL via INTRAVENOUS

## 2022-09-17 NOTE — OB Triage Note (Signed)
Presents with complaint of falling this morning around 0230 am. States she tripped over a toy and fell on her right side. Complains of R leg, states that it hurts to move it. Denies any bleeding, leaking of fluid or decreased fetal movement. EFM's applied, VS stable.

## 2022-09-17 NOTE — Discharge Summary (Cosign Needed Addendum)
Morgan Collier is a 22 y.o. female. She is at [redacted]w[redacted]d gestation. Patient's last menstrual period was 01/19/2022 (exact date). Estimated Date of Delivery: 10/26/22  Prenatal care site: [facility]  Chief complaint: fall  HPI: Morgan Collier presents to L&D with complaints of a fall at home around 2 am. She reports that she was walking down the hallway going to the bathroom when she tripped over a toy on the floor. She states she fell on her right side hitting her belly and hip. She denies any vaginal bleeding, loss of fluid, contractions or pain in her belly.  Factors complicating pregnancy: Previous C/S x1 with BEB Rh Negative GDM  Varicella None Immune - Offer immunization postpartum Asthma HSV 1 Axillary hidradenitis suppurativa  Hospitalized with last pregnancy with Sepsis. H/o mental health diagnoses: Depression and Anxiety    S: Resting comfortably. no VB.no LOF,  Active fetal movement.   Maternal Medical History:  Past Medical Hx:  has a past medical history of Asthma, First trimester screening, Gestational diabetes, Hidradenitis suppurativa of right axilla, History of anemia, Labor and delivery indication for care or intervention (08/02/2018), Labor and delivery, indication for care (08/02/2018), and Normal labor and delivery (08/02/2018).    Past Surgical Hx:  has a past surgical history that includes Wisdom tooth extraction (2016); Incision and drainage abscess (Right, 10/10/2015); and Cesarean section (N/A, 06/28/2019).   No Known Allergies   Prior to Admission medications   Medication Sig Start Date End Date Taking? Authorizing Provider  acetaminophen (TYLENOL) 500 MG tablet Take 2 tablets (1,000 mg total) by mouth every 6 (six) hours. 06/30/19  Yes Gustavo Lah, CNM  escitalopram (LEXAPRO) 10 MG tablet Take 10 mg by mouth daily.   Yes [provider]  FLOVENT HFA 44 MCG/ACT inhaler  04/30/19  Yes [provider]  fluticasone (FLONASE) 50 MCG/ACT nasal  spray Place 1 spray into both nostrils daily as needed.  03/25/18  Yes [provider]  glyBURIDE (DIABETA) 2.5 MG tablet Take 2.5 mg by mouth daily with breakfast.   Yes [provider]  Prenatal Vit-Fe Fumarate-FA (PRENATAL MULTIVITAMIN) TABS tablet Take 1 tablet by mouth daily at 12 noon.   Yes [provider]  PROAIR HFA 108 (90 Base) MCG/ACT inhaler Inhale 2 puffs into the lungs every 6 (six) hours as needed. 10/26/15  Yes [provider]  senna-docusate (SENOKOT-S) 8.6-50 MG tablet Take 2 tablets by mouth daily. 07/01/19  Yes Gustavo Lah, CNM  valACYclovir (VALTREX) 1000 MG tablet Take 1,000 mg by mouth 2 (two) times daily. PRN for onset symptoms   Yes [provider]    Social History: She  reports that she has never smoked. She has never been exposed to tobacco smoke. She has never used smokeless tobacco. She reports current alcohol use. She reports that she does not currently use drugs after having used the following drugs: Marijuana.  Family History: family history includes ADD / ADHD in her father; Anemia in her daughter; Cirrhosis in her maternal grandfather; Diabetes in her paternal grandmother; Eczema in her son; Heart disease in her maternal grandmother; Hypertension in her maternal grandmother and paternal grandmother; Throat cancer in her maternal uncle. ,no history of gyn cancers  Review of Systems: A full review of systems was performed and negative except as noted in the HPI.    O:  Resp 18   LMP 01/19/2022 (Exact Date)  Results for orders placed or performed during the hospital encounter of 09/17/22 (from  the past 48 hour(s))  Fetal screen   Collection Time: 09/17/22  9:48 AM  Result Value Ref Range   Fetal Screen      NEG Performed at [facility], [address] [place], [place] [address]   Type and screen [facility]   Collection Time: 09/17/22  9:48 AM  Result Value Ref Range   ABO/RH(D) A  NEG    Antibody Screen NEG    Sample Expiration      09/20/2022,2359 Performed at [facility], [address] [place], [place] [address]   Rhogam injection   Collection Time: 09/17/22  9:48 AM  Result Value Ref Range   Unit Number [id number]    Blood Component Type RHIG    Unit division 00    Status of Unit ISSUED    Transfusion Status      OK TO TRANSFUSE Performed at [facility], [address] [place], [place] [address]      Constitutional: NAD, AAOx3  HE/ENT: extraocular movements grossly intact, moist mucous membranes CV: RRR PULM: nl respiratory effort, CTABL Abd: gravid, non-tender, non-distended, soft  Ext: Non-tender, Nonedmeatous Psych: mood appropriate, speech normal Pelvic : deferred SVE:     Fetal Monitor: Baseline: 130 bpm Variability: moderate Accels: Present Decels: none Toco: irregular on monitor, patient denies feeling them  Category: I   Assessment: 22 y.o. [redacted]w[redacted]d here for antenatal surveillance during pregnancy.  Principle diagnosis: S/P fall at home  Plan: Labor: not present.  Fetal Wellbeing: Reassuring Cat 1 tracing. Prolonged fetal monitoring x 4 hours Rhogam 300 mcg given IV fluid bolus given D/c home stable, precautions reviewed, follow-up as scheduled.   ----- Chari Manning, CNM Certified Nurse Midwife [facility]

## 2022-09-18 LAB — RHOGAM INJECTION: Unit division: 0

## 2022-09-20 ENCOUNTER — Observation Stay
Admission: EM | Admit: 2022-09-20 | Discharge: 2022-09-20 | Disposition: A | Discharge status: Home / Self Care | Payer: Managed Care, Other (non HMO) | Attending: Obstetrics and Gynecology | Admitting: Obstetrics and Gynecology

## 2022-09-20 DIAGNOSIS — Z3A35 35 weeks gestation of pregnancy: Secondary | ICD-10-CM | POA: Insufficient documentation

## 2022-09-20 DIAGNOSIS — O26899 Other specified pregnancy related conditions, unspecified trimester: Principal | ICD-10-CM | POA: Diagnosis present

## 2022-09-20 DIAGNOSIS — O99513 Diseases of the respiratory system complicating pregnancy, third trimester: Secondary | ICD-10-CM | POA: Diagnosis not present

## 2022-09-20 DIAGNOSIS — J45909 Unspecified asthma, uncomplicated: Secondary | ICD-10-CM | POA: Diagnosis not present

## 2022-09-20 DIAGNOSIS — Z79899 Other long term (current) drug therapy: Secondary | ICD-10-CM | POA: Diagnosis not present

## 2022-09-20 DIAGNOSIS — O2392 Unspecified genitourinary tract infection in pregnancy, second trimester: Secondary | ICD-10-CM | POA: Diagnosis not present

## 2022-09-20 DIAGNOSIS — O24415 Gestational diabetes mellitus in pregnancy, controlled by oral hypoglycemic drugs: Secondary | ICD-10-CM | POA: Diagnosis not present

## 2022-09-20 DIAGNOSIS — Z98891 History of uterine scar from previous surgery: Secondary | ICD-10-CM | POA: Diagnosis not present

## 2022-09-20 DIAGNOSIS — Z7984 Long term (current) use of oral hypoglycemic drugs: Secondary | ICD-10-CM | POA: Diagnosis not present

## 2022-09-20 LAB — RUPTURE OF MEMBRANE (ROM)PLUS: Rom Plus: NEGATIVE

## 2022-09-20 NOTE — OB Triage Note (Addendum)
Morgan Collier 22 y.o. G4 P2 @[redacted]w[redacted]d  GA  presents to Labor & Delivery triage via wheelchair steered by ED staff reporting cramping. She states that she felt a gush of fluid while lying in bed at 0100. She got up to go to the bathroom and noticed more fluid.She reports cramping that started 30 minutes later and decided to come to the hospital to get checked out. Patient reports having sex yesterday. She denies signs and symptoms consist with active vaginal bleeding. She states positive fetal movement. External FM and TOCO applied to non-tender abdomen. Initial FHR 135. Vital signs obtained and within normal limits. Patient oriented to care environment including call bell and bed control use. Donato Schultz, CNM notified of patient's arrival.

## 2022-09-22 NOTE — Discharge Summary (Signed)
Morgan Collier is a 22 y.o. female. She is at [redacted]w[redacted]d gestation. Patient's last menstrual period was 01/19/2022 (exact date). Estimated Date of Delivery: 10/26/22  Prenatal care site: [facility]   Current pregnancy complicated by:  Previous C/S x1 with BEB Rh Negative GDM  Varicella None Immune - Offer immunization postpartum Asthma HSV 1 Axillary hidradenitis suppurativa  Hospitalized with last pregnancy with Sepsis. H/o mental health diagnoses: Depression and Anxiety  Chief complaint: involuntary vaginal discharge at 0100, believes her water may have broken.   S: Resting comfortably. no CTX, no VB.no LOF,  Active fetal movement.  Denies: HA, visual changes, SOB, or RUQ/epigastric pain  Maternal Medical History:   Past Medical History:  Diagnosis Date   Asthma    Seasonal   First trimester screening    Gestational diabetes    Hidradenitis suppurativa of right axilla    History of anemia    Labor and delivery indication for care or intervention 08/02/2018   Labor and delivery, indication for care 08/02/2018   Normal labor and delivery 08/02/2018    Past Surgical History:  Procedure Laterality Date   CESAREAN SECTION N/A 06/28/2019   Procedure: CESAREAN SECTION;  Surgeon: Christeen Douglas, MD;  Location: [facility] ORS;  Service: Obstetrics;  Laterality: N/A;   INCISION AND DRAINAGE ABSCESS Right 10/10/2015   Procedure: Incision and drainage of complex axillary abscess Excisions of complex hidradenitis on right axilla                            3. Excisional debridement of skin subcutaneous tissue;  Surgeon: Leafy Ro, MD;  Location: [facility];  Service: General;  Laterality: Right;   WISDOM TOOTH EXTRACTION  2016    No Known Allergies  Prior to Admission medications   Medication Sig Start Date End Date Taking? Authorizing Provider  escitalopram (LEXAPRO) 10 MG tablet Take 10 mg by mouth daily.   Yes [provider]  glyBURIDE (DIABETA) 2.5 MG  tablet Take 2.5 mg by mouth daily with breakfast.   Yes [provider]  Prenatal Vit-Fe Fumarate-FA (PRENATAL MULTIVITAMIN) TABS tablet Take 1 tablet by mouth daily at 12 noon.   Yes [provider]  PROAIR HFA 108 (90 Base) MCG/ACT inhaler Inhale 2 puffs into the lungs every 6 (six) hours as needed. 10/26/15  Yes [provider]  acetaminophen (TYLENOL) 500 MG tablet Take 2 tablets (1,000 mg total) by mouth every 6 (six) hours as needed (for pain scale < 4  OR  temperature  >/=  100.5 F). 09/17/22   [name], CNM  FLOVENT [facility] 44 MCG/ACT inhaler  04/30/19   [provider]  fluticasone (FLONASE) 50 MCG/ACT nasal spray Place 1 spray into both nostrils daily as needed.  03/25/18   [provider]  valACYclovir (VALTREX) 1000 MG tablet Take 1,000 mg by mouth 2 (two) times daily. PRN for onset symptoms    [provider]      Social History: She  reports that she has never smoked. She has never been exposed to tobacco smoke. She has never used smokeless tobacco. She reports current alcohol use. She reports that she does not currently use drugs after having used the following drugs: Marijuana.  Family History: family history includes ADD / ADHD in her father; Anemia in her daughter; Cirrhosis in her maternal grandfather; Diabetes in her paternal grandmother; Eczema in her son; Heart disease in her  maternal grandmother; Hypertension in her maternal grandmother and paternal grandmother; Throat cancer in her maternal uncle.  no history of gyn cancers  Review of Systems: A full review of systems was performed and negative except as noted in the HPI.     O:  BP 127/73 (BP Location: Left Arm)   Pulse 100   Temp 98.1 F (36.7 C) (Oral)   Resp 17   LMP 01/19/2022 (Exact Date)  No results found for this or any previous visit (from the past 48 hour(s)).   Constitutional: NAD, AAOx3  HE/ENT: extraocular movements grossly intact,  moist mucous membranes CV: RRR PULM: nl respiratory effort, CTABL     Abd: gravid, non-tender, non-distended, soft      Ext: Non-tender, Nonedematous   Psych: mood appropriate, speech normal Pelvic: deferred  Fetal  monitoring: Cat 1 Appropriate for GA Baseline: 135bpm Variability: moderate Accelerations: present x >2 Decelerations absent  A/P: 22 y.o. [redacted]w[redacted]d here for antenatal surveillance for possible leaking amniotic fluid  Principle Diagnosis:  Vaginal discharge in pregnancy  PPROM: not present, ROM plus negative Labor: not present.  Fetal Wellbeing: Reassuring Cat 1 tracing. Reactive NST  D/c home stable, precautions reviewed, follow-up as scheduled.    Morgan Collier, CNM 09/22/2022 9:37 AM

## 2022-09-24 ENCOUNTER — Observation Stay
Admission: EM | Admit: 2022-09-24 | Discharge: 2022-09-24 | Disposition: A | Discharge status: Home / Self Care | Payer: Managed Care, Other (non HMO) | Source: Home / Self Care | Admitting: Obstetrics and Gynecology

## 2022-09-24 ENCOUNTER — Observation Stay
Admission: EM | Admit: 2022-09-24 | Discharge: 2022-09-24 | Disposition: A | Discharge status: Home / Self Care | Payer: Managed Care, Other (non HMO) | Attending: Obstetrics and Gynecology | Admitting: Obstetrics and Gynecology

## 2022-09-24 ENCOUNTER — Other Ambulatory Visit: Payer: Self-pay

## 2022-09-24 ENCOUNTER — Encounter: Payer: Self-pay | Admitting: Obstetrics and Gynecology

## 2022-09-24 DIAGNOSIS — O479 False labor, unspecified: Principal | ICD-10-CM | POA: Diagnosis present

## 2022-09-24 DIAGNOSIS — O99513 Diseases of the respiratory system complicating pregnancy, third trimester: Secondary | ICD-10-CM | POA: Insufficient documentation

## 2022-09-24 DIAGNOSIS — J45909 Unspecified asthma, uncomplicated: Secondary | ICD-10-CM | POA: Insufficient documentation

## 2022-09-24 DIAGNOSIS — O4703 False labor before 37 completed weeks of gestation, third trimester: Principal | ICD-10-CM | POA: Insufficient documentation

## 2022-09-24 DIAGNOSIS — Z3A35 35 weeks gestation of pregnancy: Secondary | ICD-10-CM | POA: Insufficient documentation

## 2022-09-24 LAB — WET PREP, GENITAL
Sperm: NONE SEEN
Trich, Wet Prep: NONE SEEN
WBC, Wet Prep HPF POC: >=10 — AB (ref <10)
Yeast Wet Prep HPF POC: NONE SEEN

## 2022-09-24 MED ORDER — LACTATED RINGERS IV BOLUS
1000.0000 mL | Freq: Once | INTRAVENOUS | Status: AC
  Administered 2022-09-24: 1000 mL via INTRAVENOUS

## 2022-09-24 MED ORDER — METRONIDAZOLE 500 MG PO TABS
500.0000 mg | ORAL_TABLET | Freq: Two times a day (BID) | ORAL | 0 refills | Status: AC
Start: 2022-09-24 — End: 2022-10-01

## 2022-09-24 NOTE — OB Triage Note (Signed)
Patient is a 21 yo, G3P2, at 35 weeks and 3 days. Patient presents with complaints of contractions every 5 minutes reporting starting at 2100 on 09/23/2022. Patient denies any vaginal bleeding or LOF. Patient reports +FM. Monitors applied and assessing. Initial fetal heart tone is 140. F. Rubye Oaks CNM notified and aware of patients arrival to unit.

## 2022-09-24 NOTE — H&P (Signed)
HISTORY AND PHYSICAL NOTE  History of Present Illness: Morgan Collier is a 22 y.o. [phone] at [redacted]w[redacted]d admitted for uterine contractions every 5 mins  Reports active fetal movement  Contractions: denies  LOF/SROM: denies Vaginal bleeding: denies   Factors complicating pregnancy:  Previous C/S x1 with BEB Rh Negative GDM  Varicella None Immune - Offer immunization postpartum Asthma HSV 1 Axillary hidradenitis suppurativa  Hospitalized with last pregnancy with Sepsis. H/o mental health diagnoses: Depression and Anxiety   Prenatal care site:  [facility]  Patient Active Problem List   Diagnosis Date Noted   Uterine contractions 09/24/2022   Abdominal pain during pregnancy, antepartum 09/20/2022   Fall at home 09/17/2022   COVID-19 affecting pregnancy in third trimester 08/20/2022   Pregnancy 06/24/2022   Major depressive disorder, recurrent episode, moderate (HCC) 05/20/2022   Supervision of normal pregnancy 03/21/2022   Supervision of high risk pregnancy in third trimester 03/21/2022   Overweight BMI=27.4 07/09/2020   Gestational diabetes x2 07/09/2020   IBS (irritable bowel syndrome) 07/09/2020   Depression affecting pregnancy, postpartum 07/09/2020   Sepsis (HCC) 03/27/2019   Hypokalemia 03/27/2019   Asthma 03/27/2019   Hidradenitis suppurativa of right axilla 10/28/2016    Past Medical History:  Diagnosis Date   Asthma    Seasonal   First trimester screening    Gestational diabetes    Hidradenitis suppurativa of right axilla    History of anemia    Labor and delivery indication for care or intervention 08/02/2018   Labor and delivery, indication for care 08/02/2018   Normal labor and delivery 08/02/2018    Past Surgical History:  Procedure Laterality Date   CESAREAN SECTION N/A 06/28/2019   Procedure: CESAREAN SECTION;  Surgeon: Christeen Douglas, MD;  Location: [facility] ORS;  Service: Obstetrics;  Laterality: N/A;   INCISION AND DRAINAGE ABSCESS  Right 10/10/2015   Procedure: Incision and drainage of complex axillary abscess Excisions of complex hidradenitis on right axilla                            3. Excisional debridement of skin subcutaneous tissue;  Surgeon: Leafy Ro, MD;  Location: [facility];  Service: General;  Laterality: Right;   WISDOM TOOTH EXTRACTION  2016    OB History  Gravida Para Term Preterm AB Living  4 2 2     2   SAB IAB Ectopic Multiple Live Births        0 2    # Outcome Date GA Lbr Len/2nd Weight Sex Type Anes PTL Lv  4 Current           3 Term 06/28/19 [redacted]w[redacted]d  3260 g F CS-LTranv EPI, Spinal  LIV  2 Term 08/03/18 [redacted]w[redacted]d  3260 g M Vag-Spont  N LIV  1 Gravida             Social History:  reports that she has never smoked. She has never been exposed to tobacco smoke. She has never used smokeless tobacco. She reports current alcohol use. She reports that she does not currently use drugs after having used the following drugs: Marijuana.  Family History: family history includes ADD / ADHD in her father; Anemia in her daughter; Cirrhosis in her maternal grandfather; Diabetes in her paternal grandmother; Eczema in her son; Heart disease in her maternal grandmother; Hypertension in her maternal grandmother and paternal grandmother; Throat cancer in her maternal uncle.  No Known Allergies  Medications Prior to Admission  Medication Sig Dispense Refill Last Dose   acetaminophen (TYLENOL) 500 MG tablet Take 2 tablets (1,000 mg total) by mouth every 6 (six) hours as needed (for pain scale < 4  OR  temperature  >/=  100.5 F).   Past Week   escitalopram (LEXAPRO) 10 MG tablet Take 10 mg by mouth daily.   09/23/2022   FLOVENT HFA 44 MCG/ACT inhaler    Past Month   fluticasone (FLONASE) 50 MCG/ACT nasal spray Place 1 spray into both nostrils daily as needed.    Past Month   glyBURIDE (DIABETA) 2.5 MG tablet Take 2.5 mg by mouth daily with breakfast.   09/23/2022   Prenatal Vit-Fe Fumarate-FA (PRENATAL  MULTIVITAMIN) TABS tablet Take 1 tablet by mouth daily at 12 noon.   09/23/2022   valACYclovir (VALTREX) 1000 MG tablet Take 1,000 mg by mouth 2 (two) times daily. PRN for onset symptoms   Past Month   PROAIR HFA 108 (90 Base) MCG/ACT inhaler Inhale 2 puffs into the lungs every 6 (six) hours as needed.  3       Physical Examination: Vitals:  BP 136/77   Pulse (!) 101   Temp 97.8 F (36.6 C) (Oral)   Resp 18   LMP 01/19/2022 (Exact Date)  General: no acute distress.     Membranes: intact FHT:  FHR: 135 bpm, variability: moderate,  accelerations:  Present,  decelerations:  Absent Category/reactivity:  Category I UC:  6-7 mins  Labs:  No results found for this or any previous visit (from the past 24 hour(s)).   Prenatal Labs: Blood type/Rh A neg  Antibody screen neg  Rubella Immune  Varicella Non-Immune  RPR NR  HBsAg Neg  Hep C NR  HIV NR  GC neg  Chlamydia neg  Genetic screening cfDNA negative   1 hour GTT 177  3 hour GTT Abnormal   GBS     Imaging Studies: No results found.  Assessment and Plan: Patient Active Problem List   Diagnosis Date Noted   Uterine contractions 09/24/2022   Abdominal pain during pregnancy, antepartum 09/20/2022   Fall at home 09/17/2022   COVID-19 affecting pregnancy in third trimester 08/20/2022   Pregnancy 06/24/2022   Major depressive disorder, recurrent episode, moderate (HCC) 05/20/2022   Supervision of normal pregnancy 03/21/2022   Supervision of high risk pregnancy in third trimester 03/21/2022   Overweight BMI=27.4 07/09/2020   Gestational diabetes x2 07/09/2020   IBS (irritable bowel syndrome) 07/09/2020   Depression affecting pregnancy, postpartum 07/09/2020   Sepsis (HCC) 03/27/2019   Hypokalemia 03/27/2019   Asthma 03/27/2019   Hidradenitis suppurativa of right axilla 10/28/2016    1. Admit to Antenatal - Fetal monitoring throught the night -Routine antenatal care   Kwane Rohl LUCY Delmar Landau, CNM   Certified Nurse Midwife [facility]

## 2022-09-24 NOTE — Progress Notes (Signed)
Patient discharged home per provider order. After Visit Summary was printed and given to the patient. Discharge education completed with patient and support person including follow up instructions, appointments, and medication list. She received labor and bleeding precautions. Patient verbalized understanding and voiced no questions at this time. Patient instructed to return to ED, call 911, or call provider for any changes in condition. Patient ambulated off unit with support person home.

## 2022-09-24 NOTE — OB Triage Note (Signed)

## 2022-09-24 NOTE — Discharge Summary (Signed)
Patient ID: Morgan Collier MRN: [id number] DOB/AGE: [birth year] 22 y.o.  Admit date: 09/24/2022 Discharge date: 09/24/2022  Admission Diagnoses: 22yo G2P2 at [redacted]w[redacted]d presents with uterine contractions  Discharge Diagnoses: No active labor, no cervical change, BV  Factors complicating pregnancy: Previous C/S x1 with BEB Rh Negative GDM  Varicella None Immune - Offer immunization postpartum Asthma HSV 1 Axillary hidradenitis suppurativa  Hospitalized with last pregnancy with Sepsis. H/o mental health diagnoses: Depression and Anxiety  Prenatal Procedures: NST  Consults: None  Significant Diagnostic Studies:  Results for orders placed or performed during the hospital encounter of 09/24/22 (from the past 168 hour(s))  Wet prep, genital   Collection Time: 09/24/22  7:26 AM  Result Value Ref Range   Yeast Wet Prep HPF POC NONE SEEN NONE SEEN   Trich, Wet Prep NONE SEEN NONE SEEN   Clue Cells Wet Prep HPF POC PRESENT (A) NONE SEEN   WBC, Wet Prep HPF POC >=10 (A) <10   Sperm NONE SEEN   Results for orders placed or performed during the hospital encounter of 09/20/22 (from the past 168 hour(s))  Rupture of Membrane (ROM) Plus   Collection Time: 09/20/22  2:20 AM  Result Value Ref Range   Rom Plus NEGATIVE     Treatments: IV hydration  Hospital Course:  This is a 22 y.o. Morgan Collier with IUP at [redacted]w[redacted]d seen for uterine contractions at 35 weeks, noted to have a cervical exam of 3.5/50/-2.  No leaking of fluid and no bleeding.  She got 1L of LR, and a wet prep.  She was positive for BV.  She reports no change in the cramping intensity. She was observed, fetal heart rate monitoring remained reassuring, and she had no signs/symptoms of active preterm labor or other maternal-fetal concerns.  Her cervical exam did not advance from admission.  She was deemed stable for discharge to home with outpatient follow up.  Discharge Physical Exam:  BP 133/71 (BP Location: Right Arm)   Pulse 84   Temp  98.2 F (36.8 C) (Oral)   Resp 16   LMP 01/19/2022 (Exact Date)   General: NAD CV: RRR Pulm: CTABL, nl effort ABD: s/nd/nt, gravid DVT Evaluation: LE non-ttp, no evidence of DVT on exam.  NST: FHR baseline: 130 bpm Variability: moderate Accelerations: yes Decelerations: none Category/reactivity: reactive   TOCO: quiet SVE: deferred  Dilation: 1 Effacement (%): Thick Cervical Position: Middle Station: -3 Presentation: Vertex Exam by:: Morgan Collier, CNM   Discharge Condition: Stable  Disposition: Discharge disposition: 01-Home or Self Care        Allergies as of 09/24/2022   No Known Allergies      Medication List     TAKE these medications    acetaminophen 500 MG tablet Commonly known as: TYLENOL Take 2 tablets (1,000 mg total) by mouth every 6 (six) hours as needed (for pain scale < 4  OR  temperature  >/=  100.5 F).   escitalopram 10 MG tablet Commonly known as: LEXAPRO Take 10 mg by mouth daily.   Flovent HFA 44 MCG/ACT inhaler Generic drug: fluticasone   fluticasone 50 MCG/ACT nasal spray Commonly known as: FLONASE Place 1 spray into both nostrils daily as needed.   glyBURIDE 2.5 MG tablet Commonly known as: DIABETA Take 2.5 mg by mouth daily with breakfast.   metroNIDAZOLE 500 MG tablet Commonly known as: Flagyl Take 1 tablet (500 mg total) by mouth 2 (two) times daily for 7 days.   prenatal multivitamin Tabs tablet  Take 1 tablet by mouth daily at 12 noon.   ProAir HFA 108 (90 Base) MCG/ACT inhaler Generic drug: albuterol Inhale 2 puffs into the lungs every 6 (six) hours as needed.   valACYclovir 1000 MG tablet Commonly known as: VALTREX Take 1,000 mg by mouth 2 (two) times daily. PRN for onset symptoms        Follow-up Information     [facility] Follow up today.   Why: Keep all scheduled appointments Contact information: [address] [place] [address] [phone]                 Signed:  Quillian Quince 09/24/2022 10:11 AM

## 2022-09-24 NOTE — OB Triage Note (Signed)
Patient is a 22 yo, G3P2, at 35 weeks 3 days. Patient presents with complaints of contractions. Patient states she was here early this morning for contractions but was evaluated and sent home. Patient reports a diagnosis of BV this morning. Since then she feels like the contractions have increased in frequency and intensity and she states she lost her mucous plug.  Patient denies any vaginal bleeding or LOF. Patient reports +FM. Monitors applied and assessing. VSS. Initial fetal heart tone 135. [place] CNM notified of patients arrival to unit. Plan to place in observation for fetal monitoring and labor check.

## 2022-09-24 NOTE — Discharge Summary (Signed)
Patient ID: JAQUANDA PIROLLI MRN: [id number] DOB/AGE: [birth year] 22 y.o.  Admit date: 09/24/2022 Discharge date: 09/24/2022  Admission Diagnoses: 22yo G2P2 at [redacted]w[redacted]d presents with uterine contractions  Discharge Diagnoses: No active labor, no cervical change, BV  Factors complicating pregnancy: Previous C/S x1 with BEB Rh Negative GDM  Varicella None Immune - Offer immunization postpartum Asthma HSV 1 Axillary hidradenitis suppurativa  Hospitalized with last pregnancy with Sepsis. H/o mental health diagnoses: Depression and Anxiety  Prenatal Procedures: NST  Consults: None  Significant Diagnostic Studies:  Results for orders placed or performed during the hospital encounter of 09/24/22 (from the past 168 hour(s))  Wet prep, genital   Collection Time: 09/24/22  7:26 AM  Result Value Ref Range   Yeast Wet Prep HPF POC NONE SEEN NONE SEEN   Trich, Wet Prep NONE SEEN NONE SEEN   Clue Cells Wet Prep HPF POC PRESENT (A) NONE SEEN   WBC, Wet Prep HPF POC >=10 (A) <10   Sperm NONE SEEN   Results for orders placed or performed during the hospital encounter of 09/20/22 (from the past 168 hour(s))  Rupture of Membrane (ROM) Plus   Collection Time: 09/20/22  2:20 AM  Result Value Ref Range   Rom Plus NEGATIVE     Treatments: IV hydration  Hospital Course:  This is a 22 y.o. [id number] with IUP at [redacted]w[redacted]d seen for uterine contractions at 35 weeks, noted to have a cervical exam of 1/50/-3  No leaking of fluid and no bleeding.  She was observed, fetal heart rate monitoring remained reassuring, and she had no signs/symptoms of active preterm labor or other maternal-fetal concerns.  Her cervical exam did not change from admission.  She was deemed stable for discharge to home with outpatient follow up.  Discharge Physical Exam:  BP 125/89 (BP Location: Left Arm)   Pulse 82   Temp 98.2 F (36.8 C) (Oral)   Resp 18   Ht 5\' 2"  (1.575 m)   Wt 85.3 kg   LMP 01/19/2022 (Exact Date)   BMI  34.39 kg/m   General: NAD CV: RRR Pulm: CTABL, nl effort ABD: s/nd/nt, gravid DVT Evaluation: LE non-ttp, no evidence of DVT on exam.  NST: FHR baseline: 130 bpm Variability: moderate Accelerations: yes Decelerations: none Category/reactivity: reactive   TOCO: occasional SVE:  Dilation: 1 Effacement (%): 50 Cervical Position: Posterior Station: -3 Exam by:: Quaniyah Bugh,CNM   Discharge Condition: Stable  Disposition:  Discharge disposition: 01-Home or Self Care        Allergies as of 09/24/2022   No Known Allergies      Medication List     TAKE these medications    acetaminophen 500 MG tablet Commonly known as: TYLENOL Take 2 tablets (1,000 mg total) by mouth every 6 (six) hours as needed (for pain scale < 4  OR  temperature  >/=  100.5 F).   escitalopram 10 MG tablet Commonly known as: LEXAPRO Take 10 mg by mouth daily.   Flovent HFA 44 MCG/ACT inhaler Generic drug: fluticasone   fluticasone 50 MCG/ACT nasal spray Commonly known as: FLONASE Place 1 spray into both nostrils daily as needed.   glyBURIDE 2.5 MG tablet Commonly known as: DIABETA Take 2.5 mg by mouth daily with breakfast.   metroNIDAZOLE 500 MG tablet Commonly known as: Flagyl Take 1 tablet (500 mg total) by mouth 2 (two) times daily for 7 days.   prenatal multivitamin Tabs tablet Take 1 tablet by mouth daily at 12 noon.  ProAir HFA 108 (90 Base) MCG/ACT inhaler Generic drug: albuterol Inhale 2 puffs into the lungs every 6 (six) hours as needed.   valACYclovir 1000 MG tablet Commonly known as: VALTREX Take 1,000 mg by mouth 2 (two) times daily. PRN for onset symptoms           Signed:  Quillian Quince 09/24/2022 8:15 PM

## 2022-09-25 ENCOUNTER — Ambulatory Visit: Payer: Medicaid Other | Admitting: Licensed Clinical Social Worker

## 2022-10-01 ENCOUNTER — Encounter
Admission: RE | Admit: 2022-10-01 | Discharge: 2022-10-01 | Disposition: A | Discharge status: Home / Self Care | Payer: Managed Care, Other (non HMO) | Source: Ambulatory Visit | Attending: Obstetrics and Gynecology | Admitting: Obstetrics and Gynecology

## 2022-10-01 DIAGNOSIS — Z01812 Encounter for preprocedural laboratory examination: Secondary | ICD-10-CM

## 2022-10-01 HISTORY — DX: Anemia, unspecified: D64.9

## 2022-10-01 HISTORY — DX: Pneumonia, unspecified organism: J18.9

## 2022-10-01 NOTE — Patient Instructions (Addendum)
Your procedure is scheduled on: 10/09/22 - Thursday  Arrival Time: Please call Labor and Delivery the day before your scheduled C-Section to find out your arrival time. [phone].  Arrival: If your arrival time is prior to 6:00 am, please enter through the Emergency Room Entrance and you will be directed to Labor and Delivery. If your arrival time is 6:00 am or later, please enter the Medical Mall and follow the greeter's instructions.  REMEMBER: Instructions that are not followed completely may result in serious medical risk, up to and including death; or upon the discretion of your surgeon and anesthesiologist your surgery may need to be rescheduled.  Do not eat food or drink any liquids after midnight the night before surgery.  No gum chewing or hard candies.  One week prior to surgery: Stop Anti-inflammatories (NSAIDS) such as Advil, Aleve, Ibuprofen, Motrin, Naproxen, Naprosyn and Aspirin based products such as Excedrin, Goody's Powder, BC Powder.  Stop ANY OVER THE COUNTER supplements until after surgery.  You may however, continue to take Tylenol if needed for pain up until the day of surgery.   TAKE ONLY THESE MEDICATIONS THE MORNING OF SURGERY WITH A SIP OF WATER:  NONE  No Alcohol for 24 hours before or after surgery.  No Smoking including e-cigarettes for 24 hours prior to surgery.  No chewable tobacco products for at least 6 hours prior to surgery.  No nicotine patches on the day of surgery.  Do not use any "recreational" drugs for at least a week prior to your surgery.  Please be advised that the combination of cocaine and anesthesia may have negative outcomes, up to and including death. If you test positive for cocaine, your surgery will be cancelled.  On the morning of surgery brush your teeth with toothpaste and water, you may rinse your mouth with mouthwash if you wish. Do not swallow any toothpaste or mouthwash.  Use CHG wipes as directed on instruction  sheet.  Do not wear jewelry, make-up, hairpins, clips or nail polish.  Do not wear lotions, powders, or perfumes.   Do not shave body hair from the neck down 48 hours before surgery.  Contact lenses, hearing aids and dentures may not be worn into surgery.  Do not bring valuables to the hospital. [facility] is not responsible for any missing/lost belongings or valuables.   Notify your doctor if there is any change in your medical condition (cold, fever, infection).  Wear comfortable clothing (specific to your surgery type) to the hospital.  After surgery, you can help prevent lung complications by doing breathing exercises.  Take deep breaths and cough every 1-2 hours. Your doctor may order a device called an Incentive Spirometer to help you take deep breaths. When coughing or sneezing, hold a pillow firmly against your incision with both hands. This is called "splinting." Doing this helps protect your incision. It also decreases belly discomfort.  Please call the Pre-admissions Testing Dept. at [phone] if you have any questions about these instructions.  Surgery Visitation Policy:  Visitor Passes   All visitors, including children, need an identification sticker when visiting. These stickers must be worn where they can be seen.   Labor & Delivery  Laboring women may have one designated support person and two other visitors of any age visit. The support person must remain the same. The visitors may switch with other visitors. Visitation is permitted 24 hours per day. The designated support person or a visitor over the age of 9 38  sleep overnight in the patient's room. A doula registered with [place] for labor and delivery support is not considered a visitor. Doulas not registered with [place] are considered visitors.  Mother Baby Unit, OB Specialty and Gynecological Care  A designated support person and three visitors of any age may visit. The three visitors  may switch out. The designated support person or a visitor age 71 or older may stay overnight in the room. During the postpartum period (up to 6 weeks), if the mother is the patient, she can have her newborn stay with her if there is another support person present who can be responsible for the baby.   Preparing the Skin Before Surgery     To help prevent the risk of infection at your surgical site, we are now providing you with rinse-free Sage 2% Chlorhexidine Gluconate (CHG) disposable wipes.  Chlorhexidine Gluconate (CHG) Soap  o An antiseptic cleaner that kills germs and bonds with the skin to continue killing germs even after washing  o Used for showering the night before surgery and morning of surgery  The night before surgery: Shower or bathe with warm water. Do not apply perfume, lotions, powders. Wait one hour after shower. Skin should be dry and cool. Open Sage wipe package - use 6 disposable cloths. Wipe body using one cloth for the right arm, one cloth for the left arm, one cloth for the right leg, one cloth for the left leg, one cloth for the chest/abdomen area, and one cloth for the back. Do not use on open wounds or sores. Do not use on face or genitals (private parts). If you are breast feeding, do not use on breasts. 5. Do not rinse, allow to dry. 6. Skin may feel "tacky" for several minutes. 7. Dress in clean clothes. 8. Place clean sheets on your bed and do not sleep with pets.  REPEAT ABOVE ON THE MORNING OF SURGERY BEFORE ARRIVING TO THE HOSPITAL.

## 2022-10-07 ENCOUNTER — Encounter
Admission: RE | Admit: 2022-10-07 | Discharge: 2022-10-07 | Disposition: A | Discharge status: Home / Self Care | Payer: Managed Care, Other (non HMO) | Source: Ambulatory Visit | Attending: Obstetrics and Gynecology | Admitting: Obstetrics and Gynecology

## 2022-10-07 DIAGNOSIS — Z01812 Encounter for preprocedural laboratory examination: Secondary | ICD-10-CM | POA: Insufficient documentation

## 2022-10-07 DIAGNOSIS — Z98891 History of uterine scar from previous surgery: Secondary | ICD-10-CM | POA: Insufficient documentation

## 2022-10-07 LAB — CBC
HCT: 35.5 % — ABNORMAL LOW (ref 36.0–46.0)
Hemoglobin: 10.7 g/dL — ABNORMAL LOW (ref 12.0–15.0)
MCH: 19.8 pg — ABNORMAL LOW (ref 26.0–34.0)
MCHC: 30.1 g/dL (ref 30.0–36.0)
MCV: 65.7 fL — ABNORMAL LOW (ref 80.0–100.0)
Platelets: 254 K/uL (ref 150–400)
RBC: 5.4 MIL/uL — ABNORMAL HIGH (ref 3.87–5.11)
RDW: 20.4 % — ABNORMAL HIGH (ref 11.5–15.5)
WBC: 8.5 K/uL (ref 4.0–10.5)
nRBC: 0.8 % — ABNORMAL HIGH (ref 0.0–0.2)

## 2022-10-07 LAB — RAPID HIV SCREEN (HIV 1/2 AB+AG)
HIV 1/2 Antibodies: NONREACTIVE
HIV-1 P24 Antigen - HIV24: NONREACTIVE

## 2022-10-07 NOTE — H&P (Signed)
OB History & Physical   History of Present Illness:  Chief Complaint: here for repeat c-section  HPI:  Morgan Collier is a 22 y.o. [phone] female at [redacted]w[redacted]d dated by LMP consistent with 8 week ultrsaound.  Her pregnancy has been complicated by GDM, uncontrolled (she has been unable to take insulin and has not been checking her BG values on glyburide), obesity, asthma, rh negative status, and history of c-section.    She denies contractions.   She denies leakage of fluid.   She denies vaginal bleeding.   She denies fetal movement.    Total weight gain for pregnancy: 15 kg (33 lb)   Obstetrical Problem List: Pregnancy Problems (from 03/21/22 to present)     Problem Noted Resolved   Previous cesarean section 05/19/2022 by Kathaleen Grinder, CNM No   Supervision of high risk pregnancy in third trimester (HHS-HCC) 03/21/2022 by Launa Flight, CMA No   Overview Addendum 09/30/2022 11:06 AM by Rubye Oaks, MD    22 y.o. [phone] at  Patient's last menstrual period was 01/19/2022 (exact date). consistent with  with ultrasound on 03/21/22 @ [redacted]w[redacted]d .                                 Estimated Date of Delivery: 10/26/22 Sex of baby and name:  "Elita Boone"   Partner:  Dayshun    Factors complicating this pregnancy   RESOLVEDRef to MFM based off 08/15/22 ultrasound  Resolved 08/15/22  Previous C/S x1 with BEB C/s done for:  Arrest of  Dilation Delivery preference: C-Section on 10/09/2022 with BEB x TOLAC counseling 06/26/22 U/s on 7/19 to recheck for placenta previa - Resolved 08/15/22  7/19: Anterior placenta 7.60cm away from internal os. Rh Negative Rhogam given 03/13/2022 at [facility] Give Rhogam at 28 Weeks- given 08/06/22 GDM  Early 1 hour GTT - 177 - unable to complete 3 hour, will monitor BS for 2 weeks (starting around 5/16) [facility] referral: Done and pt was seen May 2024 Medications: Start Insulin 06/26/22  am : 15N+10R , pm : 7N +7R - 08/18/22 has not started insulin, does not feel like  she could inject herself. 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 H/o mental health diagnoses: Depression and Anxiety Medications during pregnancy:   04/18/22 Lexapro 5 mg then titrate up to 10 mg daily - stopped taking in June 07/30/22 EPDS 22 - convinced her to get back on the Lexapro Counseling:  EPDS 20 @ [facility] 04/18/22 Counselor: Kathreen Cosier - [facility]  Screening results and needs: NOB:  Medicaid Questionnaire: Completed 04/18/22 Depression Score:20 MBT:A Negative   Ab screen:Neg    HIV: Neg  RPR: NR   Hep B:Neg  Hep C:NR  Pap: 11/23 nilm G/C: Neg/Neg Rubella:Immune    VZV:Non Immune  TSH:1.950  HgA1C: 5.6 Aneuploidy:  First trimester:  MaternitT21:  Negative/Female  Second trimester (AFP/tetra): ordered but did not do 28 weeks:  Review Medicaid Questionnaire:N/A []  ACHD Program Depression Score: 22 Blood consent: signed 07/30/22 Hgb: 10.5  Platelets: 314   Glucola: GDM   Rhogam: Given 08/06/22    Antibody Screening: neg 36 weeks:  GBS: Neg  G/C: Neg/neg  Hgb:10.0  Platelets:245    HIV:Neg RPR: NR  PC Ratio 115   Last [place]:  03/20/22: Uterus anteverted. Single, Viable IUP, S=[redacted]w[redacted]d. YS and amnion seen. FHR=172 BPM. Cervical length=5.13cm B/l ovaries within normal limits no FF 06/12/22: Normal anatomy seen Single, viable IUP, S=[redacted]w[redacted]d FHR=134bpm Cervical length=7.06cm Lt corpus luteum=1.8cm  Placenta=anterior previa- f/u @ 28 wks Position=transverse, head mat  lt 08/15/22: Single live IUP FHT=147 BPM. Pres=Ceph Plac=Anterior 7.60 cm from internal os. AFI=within normal limits.  08/20/22: Single live IUP FHT=158 BPM. Pres=Ceph Plac=Ant  AFI=14.3 cm  @ 95%.  EFW= 1753 G = 70.6% 09/09/22: AFI 15.1, EFW 2299 g (5lb 1oz), 60%ile AC 77%ile  Immunization:   Flu in season - received thru work 2023-2024 season Tdap at 27-36 weeks - declined Covid-19 -  RSV at 32-36 weeks -  Contraception Plan: Depo - wants to start first dose in the hospital Feeding Plan: Breastfeeding Labor Plans:                Maternal Medical History:   Past Medical History:  Diagnosis Date   Asthma without status asthmaticus (HHS-HCC)    Constipation due to slow transit    pt uses miralax as needed   Diet controlled gestational diabetes mellitus (GDM), antepartum (HHS-HCC)    Gestational diabetes mellitus, currently pregnant (HHS-HCC) 05/24/2018   GTT on 05/24/2018  was 216 Referral to [facility] placed    History of chlamydia 04/2016   Seasonal allergies     Past Surgical History:  Procedure Laterality Date   CESAREAN SECTION  06/28/2019   right underarm surgery     wisdom teeth extraction       No Known Allergies  Prior to Admission medications   Medication Sig Taking? Last Dose  acetaminophen (TYLENOL) 500 MG tablet Take by mouth Yes Taking  albuterol MDI, PROVENTIL, VENTOLIN, PROAIR, HFA (PROAIR HFA) 90 mcg/actuation inhaler Inhale 2 inhalations into the lungs every 6 (six) hours as needed for Wheezing Yes Taking  blood glucose diagnostic test strip 1 each (1 strip total) 4 (four) times daily Use as instructed. Yes Taking  blood glucose meter kit as directed Yes Taking  escitalopram oxalate (LEXAPRO) 10 MG tablet Take 1 tablet (10 mg total) by mouth once daily Yes Taking  fluticasone propion-salmeteroL (ADVAIR DISKUS) 250-50 mcg/dose diskus inhaler Inhale 1 Puff into the lungs every 12 (twelve) hours Yes Taking  glyBURIDE (DIABETA) 2.5 MG tablet Take 1  tablet (2.5 mg total) by mouth daily with breakfast AND 3 tablets (7.5 mg total) at bedtime. Do all this for 30 days. Yes Taking  hydrOXYzine pamoate (VISTARIL) 25 MG capsule Take 1-2 capsules (25-50 mg total) by mouth at bedtime as needed for Itching for up to 90 days Yes Taking  lancets Use 1 each 4 (four) times daily Use as instructed. Yes Taking  prenatal vit-iron fum-folic ac (PRENAVITE) tablet Take 1 tablet by mouth once daily Yes Taking  valACYclovir (VALTREX) 1000 MG tablet Take 1 tablet (1,000 mg total) by mouth 2 (two) times daily For 3 days or until symptoms resolve. Yes Taking  fluconazole (DIFLUCAN) 150 MG tablet Take 1 tablet (150 mg total) by mouth once for 1 dose      OB History  Gravida Para Term Preterm  AB Living  4 2 2   1 2   SAB IAB Ectopic Molar Multiple Live Births    1       2    # Outcome Date GA Lbr Len/2nd Weight Sex Type Anes PTL Lv  4 Current           3 IAB 02/24/20     TAB     2 Term 06/28/19 [redacted]w[redacted]d  3.26 kg (7 lb 3 oz) F CS-LTranv EPI, Spinal  LIV  1 Term 08/03/18 [redacted]w[redacted]d  3.26 kg (7 lb 3 oz) M Vag-Spont  N LIV    Prenatal care site: [facility]  Social History: She  reports that she has never smoked. She has never used smokeless tobacco. She reports that she does not drink alcohol and does not use drugs.  Family History: family history includes Diabetes in her paternal grandmother; Heart disease in her maternal grandmother; High blood pressure (Hypertension) in her maternal grandmother; No Known Problems in her father and mother.   Review of Systems  Constitutional: Negative.   HENT: Negative.    Eyes: Negative.   Respiratory: Negative.    Cardiovascular: Negative.   Gastrointestinal: Negative.   Genitourinary: Negative.   Musculoskeletal: Negative.   Skin: Negative.   Neurological: Negative.   Endo/Heme/Allergies: Negative.   Psychiatric/Behavioral: Negative.       Physical Exam:  BP 132/72   Pulse 93   Ht 160 cm (5\' 3" )   Wt 88.5  kg (195 lb)   LMP 01/19/2022 (Exact Date)   BMI 34.54 kg/m   Physical Exam Constitutional:      General: She is not in acute distress.    Appearance: Normal appearance.  HENT:     Head: Normocephalic and atraumatic.  Eyes:     General: No scleral icterus.    Conjunctiva/sclera: Conjunctivae normal.  Cardiovascular:     Rate and Rhythm: Normal rate and regular rhythm.     Heart sounds: No murmur heard.    No friction rub. No gallop.  Pulmonary:     Effort: Pulmonary effort is normal. No respiratory distress.     Breath sounds: Normal breath sounds. No wheezing, rhonchi or rales.  Abdominal:     General: Bowel sounds are normal. There is no distension.     Palpations: Abdomen is soft. There is mass (gravid, NT).     Tenderness: There is no abdominal tenderness. There is no guarding or rebound.  Musculoskeletal:        General: No swelling. Normal range of motion.  Neurological:     General: No focal deficit present.     Mental Status: She is oriented to person, place, and time.     Cranial Nerves: No cranial nerve deficit.  Skin:    General: Skin is warm and dry.     Findings: No lesion.  Psychiatric:        Mood and Affect: Mood normal.        Behavior: Behavior normal.        Judgment: Judgment normal.  Vitals and nursing note reviewed.    Imaging Results [place] FDC OB limited 1 or more fetuses  Result Date: 10/06/2022 Results ====== TO VIEW THE FORMAL REPORT IN [facility], OPEN THE PDF (click on Imaging result with paper clip icon, then click link under "Scans on Order"). The plain text version does not include embedded images or graphs. The image link is for reference only. [employer] is the official Paediatric nurse  site. Indication ======== F/U Growth Scan: Gestational diabetes Pregnancy ========= Singleton pregnancy. Number of fetuses: 1 Dating ======        Date    Details     Gest. age   EDD LMP    01/19/2022              37 w + 1 d  10/26/2022 Stated EDD     Previous  Ultrasound by Previous U/S   03/20/2022   GA, GA 8 w + 5 d        37 w + 2 d  10/25/2022 U/S    10/06/2022    based upon [facility], BPD, Femur, HC       36 w + 0 d  11/03/2022 Assigned dating    based on the LMP, selected on 08/15/2022         37 w + 1 d 10/26/2022 Fetal Biometry ============ BPD     84.7   mm      34w 0d  Hadlock HC     330.4   mm   37w 4d Hadlock AC     337.2   mm   37w 4d Hadlock Femur  68.7    mm   34w 6d Jeanty Fetal Weight Calculation: EFW     2,994  g       44% Hadlock EFW (lb,oz)    6 lb 10 oz EFW by Hadlock (BPD-HC-AC-FL) General Evaluation ============== Cardiac activity Present. FHR 132 bpm. Presentation: Cephalic Placenta: Placental site: Anterior. It has been documented in a previous report that there is no evidence of placenta previa. Umbilical cord: 3 vessel cord Amniotic fluid: Amount of AF: Normal. MVP 4.1 cm. AFI 11.6 cm Fetal Anatomy =========== Head / Neck    Cranium: Normal. Abdomen    Stomach: Present. Kidneys: Normal. Bladder: Present. Impression ========= A single, viable intrauterine pregnancy is seen with an estimated gestational age of 37w 1d. Dating assigned by LMP concordant with first trimester scan on 03/20/2022; measurements at that exam reported as 8w 5d. The Estimated Fetal Weight is 2994 g with a percentile of 44%. The Abdominal Circumference percentile is at 77%. The amniotic fluid volume appears normal. Fetal anatomy appears normal or was previously documented as normal. Good fetal movement, tone, and breathing were noted. These findings were explained today.   Assessment:  Morgan Collier is a 22 y.o. [phone] female at [redacted]w[redacted]d with GDM, uncontrolled, history of c-section, presents for pre-operative visit for repeat c-section.   Plan:  Admit to Labor & Delivery  CBC, T&S, NPO, IVF GBS negative.   Discussed c-section and expectations for newborn care with uncontrolled GDM and prior to 39 weeks. She voiced understanding. Consents reviewed and signed today.  Plan for  repeat c-section on 10/09/2022.     Rubye Oaks, MD 10/07/2022 12:26 PM

## 2022-10-08 ENCOUNTER — Ambulatory Visit: Payer: Medicaid Other | Admitting: Licensed Clinical Social Worker

## 2022-10-08 MED ORDER — SODIUM CHLORIDE 0.9 % IV SOLN: Freq: Once | INTRAVENOUS | Status: AC

## 2022-10-08 MED ORDER — ORAL CARE MOUTH RINSE: 15.0000 mL | Freq: Once | OROMUCOSAL | Status: AC

## 2022-10-08 MED ORDER — BUPIVACAINE HCL (PF) 0.5 % IJ SOLN
5.0000 mL | Freq: Once | INTRAMUSCULAR | Status: DC
  Filled 2022-10-08: qty 10

## 2022-10-08 MED ORDER — CEFAZOLIN SODIUM-DEXTROSE 2-4 GM/100ML-% IV SOLN
2.0000 g | INTRAVENOUS | Status: AC
  Administered 2022-10-09: 2 g via INTRAVENOUS
  Filled 2022-10-08: qty 100

## 2022-10-08 MED ORDER — BUPIVACAINE 0.25 % ON-Q PUMP DUAL CATH 400 ML
400.0000 mL | INJECTION | Status: DC
  Filled 2022-10-08: qty 400

## 2022-10-08 MED ORDER — FAMOTIDINE 20 MG PO TABS
20.0000 mg | ORAL_TABLET | Freq: Once | ORAL | Status: AC
  Administered 2022-10-09: 20 mg via ORAL
  Filled 2022-10-08: qty 1

## 2022-10-08 MED ORDER — SOD CITRATE-CITRIC ACID 500-334 MG/5ML PO SOLN: 30.0000 mL | ORAL | Status: AC

## 2022-10-08 MED ORDER — CHLORHEXIDINE GLUCONATE 0.12 % MT SOLN
15.0000 mL | Freq: Once | OROMUCOSAL | Status: AC
  Administered 2022-10-09: 15 mL via OROMUCOSAL
  Filled 2022-10-08: qty 15

## 2022-10-09 ENCOUNTER — Inpatient Hospital Stay: Payer: Managed Care, Other (non HMO) | Admitting: Anesthesiology

## 2022-10-09 ENCOUNTER — Encounter
Admission: RE | Disposition: A | Discharge status: Home / Self Care | Payer: Self-pay | Source: Ambulatory Visit | Attending: Obstetrics and Gynecology

## 2022-10-09 ENCOUNTER — Other Ambulatory Visit: Payer: Self-pay

## 2022-10-09 ENCOUNTER — Encounter: Payer: Self-pay | Admitting: Obstetrics and Gynecology

## 2022-10-09 ENCOUNTER — Inpatient Hospital Stay
Admission: RE | Admit: 2022-10-09 | Discharge: 2022-10-12 | DRG: 787 | Disposition: A | Discharge status: Home / Self Care | Payer: Managed Care, Other (non HMO) | Source: Ambulatory Visit | Attending: Obstetrics and Gynecology | Admitting: Obstetrics and Gynecology

## 2022-10-09 ENCOUNTER — Inpatient Hospital Stay: Payer: Managed Care, Other (non HMO) | Admitting: Urgent Care

## 2022-10-09 DIAGNOSIS — O24429 Gestational diabetes mellitus in childbirth, unspecified control: Secondary | ICD-10-CM | POA: Diagnosis present

## 2022-10-09 DIAGNOSIS — O9081 Anemia of the puerperium: Secondary | ICD-10-CM | POA: Diagnosis not present

## 2022-10-09 DIAGNOSIS — O99214 Obesity complicating childbirth: Secondary | ICD-10-CM | POA: Diagnosis not present

## 2022-10-09 DIAGNOSIS — F419 Anxiety disorder, unspecified: Secondary | ICD-10-CM | POA: Diagnosis present

## 2022-10-09 DIAGNOSIS — D62 Acute posthemorrhagic anemia: Secondary | ICD-10-CM | POA: Diagnosis not present

## 2022-10-09 DIAGNOSIS — Z833 Family history of diabetes mellitus: Secondary | ICD-10-CM | POA: Diagnosis not present

## 2022-10-09 DIAGNOSIS — O34219 Maternal care for unspecified type scar from previous cesarean delivery: Secondary | ICD-10-CM | POA: Diagnosis not present

## 2022-10-09 DIAGNOSIS — K5901 Slow transit constipation: Secondary | ICD-10-CM | POA: Diagnosis present

## 2022-10-09 DIAGNOSIS — O26893 Other specified pregnancy related conditions, third trimester: Secondary | ICD-10-CM | POA: Diagnosis present

## 2022-10-09 DIAGNOSIS — O24419 Gestational diabetes mellitus in pregnancy, unspecified control: Secondary | ICD-10-CM | POA: Diagnosis present

## 2022-10-09 DIAGNOSIS — A6000 Herpesviral infection of urogenital system, unspecified: Secondary | ICD-10-CM | POA: Diagnosis not present

## 2022-10-09 DIAGNOSIS — Z23 Encounter for immunization: Secondary | ICD-10-CM | POA: Diagnosis not present

## 2022-10-09 DIAGNOSIS — Z79899 Other long term (current) drug therapy: Secondary | ICD-10-CM | POA: Diagnosis not present

## 2022-10-09 DIAGNOSIS — Z7951 Long term (current) use of inhaled steroids: Secondary | ICD-10-CM

## 2022-10-09 DIAGNOSIS — O99344 Other mental disorders complicating childbirth: Secondary | ICD-10-CM | POA: Diagnosis present

## 2022-10-09 DIAGNOSIS — J45909 Unspecified asthma, uncomplicated: Secondary | ICD-10-CM | POA: Diagnosis not present

## 2022-10-09 DIAGNOSIS — O9832 Other infections with a predominantly sexual mode of transmission complicating childbirth: Secondary | ICD-10-CM | POA: Diagnosis not present

## 2022-10-09 DIAGNOSIS — Z3A37 37 weeks gestation of pregnancy: Secondary | ICD-10-CM | POA: Diagnosis not present

## 2022-10-09 DIAGNOSIS — Z6791 Unspecified blood type, Rh negative: Secondary | ICD-10-CM | POA: Diagnosis not present

## 2022-10-09 DIAGNOSIS — Z01812 Encounter for preprocedural laboratory examination: Secondary | ICD-10-CM | POA: Diagnosis not present

## 2022-10-09 DIAGNOSIS — O34211 Maternal care for low transverse scar from previous cesarean delivery: Principal | ICD-10-CM | POA: Diagnosis present

## 2022-10-09 DIAGNOSIS — O1092 Unspecified pre-existing hypertension complicating childbirth: Secondary | ICD-10-CM | POA: Diagnosis present

## 2022-10-09 DIAGNOSIS — F32A Depression, unspecified: Secondary | ICD-10-CM | POA: Diagnosis not present

## 2022-10-09 DIAGNOSIS — Z8249 Family history of ischemic heart disease and other diseases of the circulatory system: Secondary | ICD-10-CM

## 2022-10-09 DIAGNOSIS — O0993 Supervision of high risk pregnancy, unspecified, third trimester: Secondary | ICD-10-CM

## 2022-10-09 DIAGNOSIS — O9952 Diseases of the respiratory system complicating childbirth: Secondary | ICD-10-CM | POA: Diagnosis present

## 2022-10-09 DIAGNOSIS — O1093 Unspecified pre-existing hypertension complicating the puerperium: Secondary | ICD-10-CM | POA: Diagnosis not present

## 2022-10-09 DIAGNOSIS — Z98891 History of uterine scar from previous surgery: Principal | ICD-10-CM

## 2022-10-09 DIAGNOSIS — I10 Essential (primary) hypertension: Secondary | ICD-10-CM | POA: Diagnosis present

## 2022-10-09 LAB — GLUCOSE, CAPILLARY
Glucose-Capillary: 84 mg/dL (ref 70–99)
Glucose-Capillary: 72 mg/dL (ref 70–99)
Glucose-Capillary: 126 mg/dL — ABNORMAL HIGH (ref 70–99)
Glucose-Capillary: 129 mg/dL — ABNORMAL HIGH (ref 70–99)
Glucose-Capillary: 92 mg/dL (ref 70–99)

## 2022-10-09 SURGERY — Surgical Case
Anesthesia: Spinal

## 2022-10-09 MED ORDER — LIDOCAINE HCL (PF) 2 % IJ SOLN
INTRAMUSCULAR | Status: AC
  Filled 2022-10-09: qty 5

## 2022-10-09 MED ORDER — TRANEXAMIC ACID-NACL 1000-0.7 MG/100ML-% IV SOLN: 1000.0000 mg | INTRAVENOUS | Status: AC

## 2022-10-09 MED ORDER — DIBUCAINE (PERIANAL) 1 % EX OINT: 1.0000 | TOPICAL_OINTMENT | CUTANEOUS | Status: DC | PRN

## 2022-10-09 MED ORDER — SODIUM CHLORIDE 0.9% FLUSH: 3.0000 mL | INTRAVENOUS | Status: DC | PRN

## 2022-10-09 MED ORDER — SOD CITRATE-CITRIC ACID 500-334 MG/5ML PO SOLN
ORAL | Status: AC
  Administered 2022-10-09: 30 mL via ORAL
  Filled 2022-10-09: qty 15

## 2022-10-09 MED ORDER — DEXAMETHASONE SODIUM PHOSPHATE 10 MG/ML IJ SOLN
INTRAMUSCULAR | Status: AC
  Filled 2022-10-09: qty 2

## 2022-10-09 MED ORDER — PROPOFOL 10 MG/ML IV BOLUS
INTRAVENOUS | Status: DC | PRN
Start: 2022-10-09 — End: 2022-10-09
  Administered 2022-10-09: 200 mg via INTRAVENOUS

## 2022-10-09 MED ORDER — WITCH HAZEL-GLYCERIN EX PADS: 1.0000 | MEDICATED_PAD | CUTANEOUS | Status: DC | PRN

## 2022-10-09 MED ORDER — OXYTOCIN-SODIUM CHLORIDE 30-0.9 UT/500ML-% IV SOLN
INTRAVENOUS | Status: DC | PRN
  Administered 2022-10-09: 500 mL via INTRAVENOUS

## 2022-10-09 MED ORDER — BUPIVACAINE IN DEXTROSE 0.75-8.25 % IT SOLN
INTRATHECAL | Status: DC | PRN
  Administered 2022-10-09: 1.6 mL via INTRATHECAL

## 2022-10-09 MED ORDER — FENTANYL CITRATE (PF) 100 MCG/2ML IJ SOLN
INTRAMUSCULAR | Status: AC
  Filled 2022-10-09: qty 2

## 2022-10-09 MED ORDER — KETOROLAC TROMETHAMINE 30 MG/ML IJ SOLN
30.0000 mg | Freq: Four times a day (QID) | INTRAMUSCULAR | Status: AC
  Administered 2022-10-09 – 2022-10-10 (×3): 30 mg via INTRAVENOUS
  Filled 2022-10-09 (×3): qty 1

## 2022-10-09 MED ORDER — SIMETHICONE 80 MG PO CHEW
80.0000 mg | CHEWABLE_TABLET | Freq: Three times a day (TID) | ORAL | Status: DC
  Administered 2022-10-09 – 2022-10-12 (×9): 80 mg via ORAL
  Filled 2022-10-09 (×10): qty 1

## 2022-10-09 MED ORDER — DIPHENHYDRAMINE HCL 50 MG/ML IJ SOLN: 12.5000 mg | INTRAMUSCULAR | Status: DC | PRN

## 2022-10-09 MED ORDER — MENTHOL 3 MG MT LOZG: 1.0000 | LOZENGE | OROMUCOSAL | Status: DC | PRN

## 2022-10-09 MED ORDER — MORPHINE SULFATE (PF) 0.5 MG/ML IJ SOLN
INTRAMUSCULAR | Status: DC | PRN
  Administered 2022-10-09: 100 ug via INTRATHECAL

## 2022-10-09 MED ORDER — ONDANSETRON HCL 4 MG/2ML IJ SOLN
INTRAMUSCULAR | Status: DC | PRN
  Administered 2022-10-09: 4 mg via INTRAVENOUS

## 2022-10-09 MED ORDER — ONDANSETRON HCL 4 MG/2ML IJ SOLN
INTRAMUSCULAR | Status: AC
  Filled 2022-10-09: qty 4

## 2022-10-09 MED ORDER — KETOROLAC TROMETHAMINE 30 MG/ML IJ SOLN: 30.0000 mg | Freq: Four times a day (QID) | INTRAMUSCULAR | Status: DC

## 2022-10-09 MED ORDER — FERROUS SULFATE 325 (65 FE) MG PO TABS
325.0000 mg | ORAL_TABLET | Freq: Two times a day (BID) | ORAL | Status: DC
  Administered 2022-10-09 – 2022-10-12 (×6): 325 mg via ORAL
  Filled 2022-10-09 (×6): qty 1

## 2022-10-09 MED ORDER — PRENATAL MULTIVITAMIN CH
1.0000 | ORAL_TABLET | Freq: Every day | ORAL | Status: DC
  Administered 2022-10-09 – 2022-10-12 (×4): 1 via ORAL
  Filled 2022-10-09 (×4): qty 1

## 2022-10-09 MED ORDER — SUCCINYLCHOLINE CHLORIDE 200 MG/10ML IV SOSY
PREFILLED_SYRINGE | INTRAVENOUS | Status: DC | PRN
Start: 2022-10-09 — End: 2022-10-09
  Administered 2022-10-09: 140 mg via INTRAVENOUS

## 2022-10-09 MED ORDER — OXYCODONE HCL 5 MG PO TABS
5.0000 mg | ORAL_TABLET | Freq: Once | ORAL | Status: DC | PRN
  Filled 2022-10-09: qty 1

## 2022-10-09 MED ORDER — PROPOFOL 10 MG/ML IV BOLUS
INTRAVENOUS | Status: AC
  Filled 2022-10-09: qty 20

## 2022-10-09 MED ORDER — BUPIVACAINE HCL (PF) 0.5 % IJ SOLN: INTRAMUSCULAR | Status: DC | PRN

## 2022-10-09 MED ORDER — IBUPROFEN 600 MG PO TABS
600.0000 mg | ORAL_TABLET | Freq: Four times a day (QID) | ORAL | Status: DC
  Administered 2022-10-10 – 2022-10-12 (×9): 600 mg via ORAL
  Filled 2022-10-09 (×9): qty 1

## 2022-10-09 MED ORDER — OXYTOCIN-SODIUM CHLORIDE 30-0.9 UT/500ML-% IV SOLN
INTRAVENOUS | Status: AC
  Administered 2022-10-09: 2.5 [IU]/h via INTRAVENOUS
  Filled 2022-10-09: qty 500

## 2022-10-09 MED ORDER — FENTANYL CITRATE (PF) 100 MCG/2ML IJ SOLN: 25.0000 ug | INTRAMUSCULAR | Status: DC | PRN

## 2022-10-09 MED ORDER — DIPHENHYDRAMINE HCL 25 MG PO CAPS
25.0000 mg | ORAL_CAPSULE | ORAL | Status: DC | PRN
  Filled 2022-10-09: qty 1

## 2022-10-09 MED ORDER — ONDANSETRON HCL 4 MG/2ML IJ SOLN
4.0000 mg | Freq: Three times a day (TID) | INTRAMUSCULAR | Status: DC | PRN
  Administered 2022-10-09 – 2022-10-11 (×2): 4 mg via INTRAVENOUS
  Filled 2022-10-09 (×2): qty 2

## 2022-10-09 MED ORDER — TRANEXAMIC ACID-NACL 1000-0.7 MG/100ML-% IV SOLN
INTRAVENOUS | Status: AC
  Administered 2022-10-09: 1000 mg via INTRAVENOUS
  Filled 2022-10-09: qty 100

## 2022-10-09 MED ORDER — OXYCODONE HCL 5 MG/5ML PO SOLN
5.0000 mg | Freq: Once | ORAL | Status: DC | PRN
  Filled 2022-10-09: qty 5

## 2022-10-09 MED ORDER — FENTANYL CITRATE (PF) 100 MCG/2ML IJ SOLN
INTRAMUSCULAR | Status: DC | PRN
  Administered 2022-10-09: 15 ug via INTRATHECAL

## 2022-10-09 MED ORDER — KETOROLAC TROMETHAMINE 30 MG/ML IJ SOLN: 30.0000 mg | Freq: Four times a day (QID) | INTRAMUSCULAR | Status: AC

## 2022-10-09 MED ORDER — SENNOSIDES-DOCUSATE SODIUM 8.6-50 MG PO TABS
2.0000 | ORAL_TABLET | ORAL | Status: DC
  Administered 2022-10-09 – 2022-10-12 (×4): 2 via ORAL
  Filled 2022-10-09 (×4): qty 2

## 2022-10-09 MED ORDER — KETOROLAC TROMETHAMINE 30 MG/ML IJ SOLN
INTRAMUSCULAR | Status: DC | PRN
  Administered 2022-10-09: 30 mg via INTRAVENOUS

## 2022-10-09 MED ORDER — 0.9 % SODIUM CHLORIDE (POUR BTL) OPTIME
TOPICAL | Status: DC | PRN
  Administered 2022-10-09: 1000 mL

## 2022-10-09 MED ORDER — COCONUT OIL OIL
1.0000 | TOPICAL_OIL | Status: DC | PRN
  Administered 2022-10-12: 1 via TOPICAL
  Filled 2022-10-09: qty 7.5

## 2022-10-09 MED ORDER — SUCCINYLCHOLINE CHLORIDE 200 MG/10ML IV SOSY
PREFILLED_SYRINGE | INTRAVENOUS | Status: AC
  Filled 2022-10-09: qty 10

## 2022-10-09 MED ORDER — OXYTOCIN-SODIUM CHLORIDE 30-0.9 UT/500ML-% IV SOLN: 2.5000 [IU]/h | INTRAVENOUS | Status: DC

## 2022-10-09 MED ORDER — OXYCODONE-ACETAMINOPHEN 5-325 MG PO TABS
2.0000 | ORAL_TABLET | ORAL | Status: DC | PRN
  Administered 2022-10-10 – 2022-10-12 (×8): 2 via ORAL
  Filled 2022-10-09 (×8): qty 2

## 2022-10-09 MED ORDER — PHENYLEPHRINE HCL-NACL 20-0.9 MG/250ML-% IV SOLN
INTRAVENOUS | Status: AC
  Filled 2022-10-09: qty 250

## 2022-10-09 MED ORDER — OXYTOCIN-SODIUM CHLORIDE 30-0.9 UT/500ML-% IV SOLN
INTRAVENOUS | Status: AC
  Filled 2022-10-09: qty 500

## 2022-10-09 MED ORDER — VARICELLA VIRUS VACCINE LIVE 1350 PFU/0.5ML IJ SUSR
0.5000 mL | INTRAMUSCULAR | Status: AC | PRN
  Administered 2022-10-11: 0.5 mL via SUBCUTANEOUS

## 2022-10-09 MED ORDER — OXYCODONE-ACETAMINOPHEN 5-325 MG PO TABS
1.0000 | ORAL_TABLET | ORAL | Status: DC | PRN
  Administered 2022-10-10 – 2022-10-11 (×2): 1 via ORAL
  Filled 2022-10-09 (×2): qty 1

## 2022-10-09 MED ORDER — NALOXONE HCL 0.4 MG/ML IJ SOLN: 0.4000 mg | INTRAMUSCULAR | Status: DC | PRN

## 2022-10-09 MED ORDER — FENTANYL CITRATE (PF) 100 MCG/2ML IJ SOLN
INTRAMUSCULAR | Status: DC | PRN
Start: 2022-10-09 — End: 2022-10-09
  Administered 2022-10-09: 85 ug via INTRAVENOUS

## 2022-10-09 MED ORDER — ACETAMINOPHEN 325 MG PO TABS
650.0000 mg | ORAL_TABLET | Freq: Four times a day (QID) | ORAL | Status: AC
  Administered 2022-10-09 – 2022-10-10 (×4): 650 mg via ORAL
  Filled 2022-10-09 (×4): qty 2

## 2022-10-09 MED ORDER — NALOXONE HCL 4 MG/10ML IJ SOLN: 1.0000 ug/kg/h | INTRAVENOUS | Status: DC | PRN

## 2022-10-09 MED ORDER — OXYCODONE HCL 5 MG PO TABS
5.0000 mg | ORAL_TABLET | ORAL | Status: AC | PRN
  Administered 2022-10-09 – 2022-10-10 (×3): 5 mg via ORAL
  Filled 2022-10-09 (×2): qty 1

## 2022-10-09 MED ORDER — DEXAMETHASONE SODIUM PHOSPHATE 10 MG/ML IJ SOLN
INTRAMUSCULAR | Status: DC | PRN
Start: 2022-10-09 — End: 2022-10-09
  Administered 2022-10-09: 10 mg via INTRAVENOUS

## 2022-10-09 MED ORDER — DIPHENHYDRAMINE HCL 25 MG PO CAPS
25.0000 mg | ORAL_CAPSULE | Freq: Four times a day (QID) | ORAL | Status: DC | PRN
  Administered 2022-10-09: 25 mg via ORAL

## 2022-10-09 MED ORDER — LIDOCAINE HCL (PF) 1 % IJ SOLN
INTRAMUSCULAR | Status: DC | PRN
  Administered 2022-10-09: 3 mL

## 2022-10-09 MED ORDER — MORPHINE SULFATE (PF) 0.5 MG/ML IJ SOLN
INTRAMUSCULAR | Status: AC
  Filled 2022-10-09: qty 10

## 2022-10-09 MED ORDER — LACTATED RINGERS IV SOLN
INTRAVENOUS | Status: DC | PRN
Start: 2022-10-09 — End: 2022-10-09

## 2022-10-09 MED ORDER — LACTATED RINGERS IV SOLN: INTRAVENOUS | Status: DC

## 2022-10-09 MED ORDER — PHENYLEPHRINE HCL-NACL 20-0.9 MG/250ML-% IV SOLN
INTRAVENOUS | Status: DC | PRN
  Administered 2022-10-09: 40 ug/min via INTRAVENOUS

## 2022-10-09 MED ORDER — ONDANSETRON HCL 4 MG/2ML IJ SOLN
INTRAMUSCULAR | Status: AC
  Filled 2022-10-09: qty 2

## 2022-10-09 SURGICAL SUPPLY — 34 items
ADH SKN CLS APL DERMABOND .7 (GAUZE/BANDAGES/DRESSINGS) ×1
CATH KIT ON-Q SILVERSOAK 5 (CATHETERS) ×2 IMPLANT
CATH KIT ON-Q SILVERSOAK 5IN (CATHETERS)
DERMABOND ADVANCED .7 DNX12 (GAUZE/BANDAGES/DRESSINGS) ×1 IMPLANT
DRSG OPSITE POSTOP 4X10 (GAUZE/BANDAGES/DRESSINGS) ×1 IMPLANT
DRSG TELFA 3X8 NADH STRL (GAUZE/BANDAGES/DRESSINGS) ×1 IMPLANT
ELECT CAUTERY BLADE 6.4 (BLADE) ×1 IMPLANT
ELECT REM PT RETURN 9FT ADLT (ELECTROSURGICAL) ×1
ELECTRODE REM PT RTRN 9FT ADLT (ELECTROSURGICAL) ×1 IMPLANT
GAUZE SPONGE 4X4 12PLY STRL (GAUZE/BANDAGES/DRESSINGS) ×1 IMPLANT
GLOVE BIO SURGEON STRL SZ7 (GLOVE) ×1 IMPLANT
GLOVE INDICATOR 7.5 STRL GRN (GLOVE) ×1 IMPLANT
GOWN STRL REUS W/ TWL LRG LVL3 (GOWN DISPOSABLE) ×3 IMPLANT
GOWN STRL REUS W/TWL LRG LVL3 (GOWN DISPOSABLE) ×3
MANIFOLD NEPTUNE II (INSTRUMENTS) ×1 IMPLANT
MAT PREVALON FULL STRYKER (MISCELLANEOUS) ×1 IMPLANT
NS IRRIG 1000ML POUR BTL (IV SOLUTION) ×1 IMPLANT
PACK C SECTION AR (MISCELLANEOUS) ×1 IMPLANT
PAD OB MATERNITY 4.3X12.25 (PERSONAL CARE ITEMS) ×2 IMPLANT
PAD PREP OB/GYN DISP 24X41 (PERSONAL CARE ITEMS) ×1 IMPLANT
RETRACTOR TRAXI PANNICULUS (MISCELLANEOUS) IMPLANT
SCRUB CHG 4% DYNA-HEX 4OZ (MISCELLANEOUS) ×1 IMPLANT
STAPLER INSORB 30 2030 C-SECTI (MISCELLANEOUS) IMPLANT
STRIP CLOSURE SKIN 1/2X4 (GAUZE/BANDAGES/DRESSINGS) ×1 IMPLANT
SUT MNCRL 4-0 (SUTURE) ×1
SUT MNCRL 4-0 27XMFL (SUTURE) ×1
SUT PDS AB 1 TP1 96 (SUTURE) ×1 IMPLANT
SUT PLAIN GUT 0 (SUTURE) IMPLANT
SUT VIC AB 0 CTX 36 (SUTURE) ×2
SUT VIC AB 0 CTX36XBRD ANBCTRL (SUTURE) ×2 IMPLANT
SUTURE MNCRL 4-0 27XMF (SUTURE) ×1 IMPLANT
SWABSTK COMLB BENZOIN TINCTURE (MISCELLANEOUS) ×1 IMPLANT
TRAP FLUID SMOKE EVACUATOR (MISCELLANEOUS) ×1 IMPLANT
WATER STERILE IRR 500ML POUR (IV SOLUTION) ×1 IMPLANT

## 2022-10-09 NOTE — Anesthesia Preprocedure Evaluation (Signed)
Anesthesia Evaluation  Patient identified by MRN, date of birth, ID band Patient awake    Reviewed: Allergy & Precautions, NPO status , Patient's Chart, lab work & pertinent test results  Airway Mallampati: III  TM Distance: >3 FB Neck ROM: full    Dental  (+) Chipped   Pulmonary neg shortness of breath, asthma    Pulmonary exam normal        Cardiovascular Exercise Tolerance: Good (-) hypertensionNormal cardiovascular exam     Neuro/Psych  PSYCHIATRIC DISORDERS         GI/Hepatic negative GI ROS,neg GERD  ,,  Endo/Other  diabetes, Gestational    Renal/GU   negative genitourinary   Musculoskeletal   Abdominal   Peds  Hematology negative hematology ROS (+)   Anesthesia Other Findings Past Medical History: No date: Anemia No date: Asthma     Comment:  Seasonal No date: First trimester screening No date: Gestational diabetes No date: Hidradenitis suppurativa of right axilla No date: History of anemia 08/02/2018: Labor and delivery indication for care or intervention 08/02/2018: Labor and delivery, indication for care 08/02/2018: Normal labor and delivery No date: Pneumonia     Comment:  covid pna  Past Surgical History: 06/28/2019: CESAREAN SECTION; N/A     Comment:  Procedure: CESAREAN SECTION;  Surgeon: Christeen Douglas,              MD;  Location: [facility] ORS;  Service: Obstetrics;                Laterality: N/A; 10/10/2015: INCISION AND DRAINAGE ABSCESS; Right     Comment:  Procedure: Incision and drainage of complex axillary               abscess Excisions of complex hidradenitis on right axilla              3. Excisional debridement of skin subcutaneous tissue;                Surgeon: Leafy Ro, MD;  Location: [facility]               [facility];  Service: General;  Laterality: Right; 2016: WISDOM TOOTH EXTRACTION  BMI    Body Mass Index: 34.01 kg/m      Reproductive/Obstetrics (+) Pregnancy                              Anesthesia Physical Anesthesia Plan  ASA: 3  Anesthesia Plan: Spinal   Post-op Pain Management:    Induction:   PONV Risk Score and Plan:   Airway Management Planned: Natural Airway and Nasal Cannula  Additional Equipment:   Intra-op Plan:   Post-operative Plan:   Informed Consent: I have reviewed the patients History and Physical, chart, labs and discussed the procedure including the risks, benefits and alternatives for the proposed anesthesia with the patient or authorized representative who has indicated his/her understanding and acceptance.     Dental Advisory Given  Plan Discussed with: Anesthesiologist, CRNA and Surgeon  Anesthesia Plan Comments: (Patient reports no bleeding problems and no anticoagulant use.  Plan for spinal with backup GA  Patient consented for risks of anesthesia including but not limited to:  - adverse reactions to medications - damage to eyes, teeth, lips or other oral mucosa - nerve damage due to positioning  - risk of bleeding, infection and or nerve damage from spinal that could lead to paralysis - risk of headache  or failed spinal - damage to teeth, lips or other oral mucosa - sore throat or hoarseness - damage to heart, brain, nerves, lungs, other parts of body or loss of life  Patient voiced understanding.)       Anesthesia Quick Evaluation

## 2022-10-09 NOTE — Transfer of Care (Signed)
Immediate Anesthesia Transfer of Care Note  Patient: Morgan Collier  Procedure(s) Performed: REPEAT CESAREAN SECTION  Patient Location:  LDR 6  Anesthesia Type:General and Spinal  Level of Consciousness: awake and patient cooperative  Airway & Oxygen Therapy: Patient Spontanous Breathing and Patient connected to face mask oxygen  Post-op Assessment: Report given to RN and Post -op Vital signs reviewed and stable  Post vital signs: Reviewed and stable  Last Vitals:  Vitals Value Taken Time  BP 142/98 0940  Temp    Pulse 82 0940  Resp 15 0940  SpO2 98 0940    Last Pain:  Vitals:   10/09/22 0534  TempSrc: Oral  PainSc:          Complications: No notable events documented.

## 2022-10-09 NOTE — Anesthesia Procedure Notes (Addendum)
Procedure Name: Intubation Date/Time: 10/09/2022 8:39 AM  Performed by: Joanette Gula, Anshi Jalloh, CRNAPre-anesthesia Checklist: Patient identified, Emergency Drugs available, Suction available and Patient being monitored Patient Re-evaluated:Patient Re-evaluated prior to induction Oxygen Delivery Method: Circle system utilized Preoxygenation: Pre-oxygenation with 100% oxygen Induction Type: IV induction, Rapid sequence and Cricoid Pressure applied Laryngoscope Size: McGraph and 3 Grade View: Grade I Tube type: Oral Tube size: 6.5 mm Number of attempts: 1 Airway Equipment and Method: Stylet and Video-laryngoscopy Placement Confirmation: ETT inserted through vocal cords under direct vision, positive ETCO2, breath sounds checked- equal and bilateral and CO2 detector Secured at: 21 cm Tube secured with: Tape Dental Injury: Teeth and Oropharynx as per pre-operative assessment  Comments: Atraumatic intubation.

## 2022-10-09 NOTE — Op Note (Signed)
Cesarean Section Operative Note    Patient Name: Morgan Collier  DOB: 02/19/2000  MRN: [id number]  Date of Surgery: 10/09/2022   Pre-operative Diagnosis:  1) History of cesarean delivery, desires repeat 2) Gestational diabetes, uncontrolled 3) intrauterine pregnancy at [redacted]w[redacted]d   Post-operative Diagnosis:  1) History of cesarean delivery, desires repeat 2) Gestational diabetes, uncontrolled 3) intrauterine pregnancy at [redacted]w[redacted]d    Procedure: Repeat Cesarean Delivery  Surgeon: Surgeons and Role:    * Conard Novak, MD - Primary   Assistants: Harrington Challenger, CNM; No other capable assistant available, in surgery requiring high level assistant.  Anesthesia: epidural   Findings:  1) normal appearing gravid uterus, fallopian tubes, and ovaries 2) viable female infant with APGARs 8 and 9, weight 2,980 grams   Quantified Blood Loss: 160 mL Estimated Blood Loss: 450 mL  Total IV Fluids: 1,500 ml   Urine Output:  100 mL clear urine at end of case  Specimens: none  Complications: no complications  Disposition: PACU - hemodynamically stable.   Maternal Condition: stable   Baby condition / location:  Couplet care / Skin to Skin  Procedure Details:  The patient was seen in the Holding Room. The risks, benefits, complications, treatment options, and expected outcomes were discussed with the patient. The patient concurred with the proposed plan, giving informed consent. identified as Morgan Collier and the procedure verified as C-Section Delivery. A Time Out was held and the above information confirmed.   After induction of anesthesia, the patient was draped and prepped in the usual sterile manner. A test of pain control was performed and found to be adequate.   A Pfannenstiel incision was made and carried down through the subcutaneous tissue to the fascia. Fascial incision was made and extended transversely. While transecting the fascia on the patient's right side, she report a sensation  of severe pain and a stabbing sensation.  Anesthesia assessed her for further pain control and eventually the decision was made to have her go under general anesthesia.  The fascia was separated from the underlying rectus tissue superiorly and inferiorly. The peritoneum was identified and entered. Peritoneal incision was extended longitudinally. The bladder flap was not bluntly or sharply freed from the lower uterine segment. A low transverse uterine incision was made and the hysterotomy was extended with cranial-caudal tension. Delivered from cephalic presentation was a 2,980 gram Living newborn infant(s) or Female with Apgar scores of 8 at one minute and 9 at five minutes. Cord ph was not sent the umbilical cord was clamped and cut cord blood was obtained for evaluation. The placenta was removed Intact and appeared normal. The uterine outline, tubes and ovaries appeared normal. The uterine incision was closed with running locked sutures of 0 Vicryl.  A second layer of the same suture was thrown in an imbricating fashion.  Hemostasis was assured.  The uterus was returned to the abdomen and the paracolic gutters were cleared of all clots and debris.  The rectus muscles were inspected and found to be hemostatic.  The fascia was then reapproximated with running sutures of 1-0 PDS, looped. The subcuticular closure was performed using absorbable, subcutaneous staples. The skin closure was reinforced using surgical skin glue.  The surgical assistant performed tissue retraction, assistance with suturing, and fundal pressure.  Instrument, sponge, and needle counts were correct prior the abdominal closure and were correct at the conclusion of the case.  The patient received Ancef 2 gram IV prior to skin incision (within 30 minutes).  For VTE prophylaxis she was wearing SCDs throughout the case.  The assistant surgeon was an CNM due to lack of availability of another Sales promotion account executive.    Signed: Conard Novak,  MD 10/09/2022 9:22 AM

## 2022-10-09 NOTE — Lactation Note (Signed)
This note was copied from a baby's chart. Lactation Consultation Note  Patient Name: Morgan Collier [id number] Date: 10/09/2022 Age:24 hours Reason for consult: Initial assessment;Mother's request;Early term 37-38.6wks;Breastfeeding assistance   Maternal Data Lactation to room for an initial assessment w/ a G3P3 patient and 7hr old baby Morgan "Morgan Collier".  Upon entry into room infant nursing on the left breast in football hold.  Patient asked "how do I know when he has had enough or is getting enough?"  Patient has a Motiff pump at home.  No history of thyroid disease or PCOS.  Patient stated that she did have anemia, but is not taking anything for it.  Has patient been taught Hand Expression?: Yes  Feeding Infant latched to the left breast in football hold.  Patient denied any pain at the feeding.  Expressed that she felt strong tugs while infant nursed.  LC made minor adjustments to positioning, making sure infant was belly to belly of mom w/ pillow support. Infant actively feeding, lips flanged out and audible swallows heard.  Mother's Current Feeding Choice: Breast Milk  LATCH Score Latch: Grasps breast easily, tongue down, lips flanged, rhythmical sucking.  Audible Swallowing: Spontaneous and intermittent  Type of Nipple: Everted at rest and after stimulation  Comfort (Breast/Nipple): Soft / non-tender  Hold (Positioning): Assistance needed to correctly position infant at breast and maintain latch.  LATCH Score: 9  Intervention LC provided education on the following;  feeding infant on demand at least 8x w/in a 24hr period and not to exceed a 3hr time frame,  arousing infant for a feed, switching breast for a feed, discussed day 1 behavior, number of pee/poopie diapers day 1 and 2, 24hr cluster feeding, and establishing a deep latch. Taught parents how to identify hunger cues in the bassinet and while feeding at the breast.  Patient verbalized understanding.   Interventions: Breast  feeding basics reviewed;Assisted with latch;Skin to skin;Hand express;Adjust position;Support pillows;Position options;Education  Discharge Pump: Personal (Motiff) WIC Program: Yes  Consult Status Consult Status: Follow-up Follow-up type: In-patient    Yvette Rack Free 10/09/2022, 4:17 PM

## 2022-10-09 NOTE — Interval H&P Note (Signed)
fHistory and Physical Interval Note:  10/09/2022 7:22 AM  Morgan Collier  has presented today for surgery, with the diagnosis of uncontrolled gestational diabetes.  The various methods of treatment have been discussed with the patient and family. After consideration of risks, benefits and other options for treatment, the patient has consented to  Procedure(s): REPEAT CESAREAN SECTION (N/A) as a surgical intervention.  The patient's history has been reviewed, patient examined, no change in status, stable for surgery.  I have reviewed the patient's chart and labs.  Questions were answered to the patient's satisfaction.    Thomasene Mohair, MD, [facility] 10/09/2022 7:22 AM

## 2022-10-09 NOTE — Lactation Note (Signed)
This note was copied from a baby's chart. Lactation Consultation Note  Patient Name: Boy Alea Rodes [id number] Date: 10/09/2022 Age:24 hours Reason for consult: L&D Initial assessment   Maternal Data Lactation to room to assist w/ a latch for a G3P3 patient and 1hr old baby.  This is caesarean delivery and infant is receiving blood sugar checks. Patient stated to [facility] that she breastfed for her other 2 children for 3-4 months with no issues.  Patient was concerned about when her milk would "come in".    Does the patient have breastfeeding experience prior to this delivery?: Yes How long did the patient breastfeed?: 3-54months w/ each child  Feeding Infant just finished a feeding upon entry into the room.  Patient stated that infant ate about 5-7 minutes on the breast.   Mother's Current Feeding Choice: Breast Milk  Interventions Lactation provided education on feeding infant on demand but not to go over a 3hr period without feeding infant.  The basics on milk production expectations was discussed with patient as well.  LC informed patient about different breastfeeding positions she could try based off of her c-section.    Patient is very exhausted and lactation will return at a later time to support mom in her breastfeeding needs.   Interventions: Skin to skin;Education  Consult Status Consult Status: Follow-up from L&D Follow-up type: In-patient    Yvette Rack Free 10/09/2022, 11:37 AM

## 2022-10-09 NOTE — Anesthesia Procedure Notes (Signed)
Spinal  Patient location during procedure: OR Start time: 10/09/2022 7:58 AM End time: 10/09/2022 8:04 AM Reason for block: surgical anesthesia Staffing Performed: other anesthesia staff  Other anesthesia staff: Rennis Harding, RN Performed by: Joanette Gula, Leviathan Macera, CRNA Authorized by: Rosaria Ferries, MD   Preanesthetic Checklist Completed: patient identified, IV checked, site marked, risks and benefits discussed, surgical consent, monitors and equipment checked, pre-op evaluation and timeout performed Spinal Block Patient position: sitting Prep: ChloraPrep and DuraPrep Patient monitoring: heart rate, continuous pulse ox and blood pressure Approach: midline Location: L4-5 Injection technique: single-shot Needle Needle type: Pencan  Needle gauge: 24 G Needle length: 10 cm Assessment Sensory level: T4 Events: CSF return Additional Notes CSF return noted. Patient states negative for paresthesia. Lauren Cozart, SRNA placed Spinal under direct supervision.

## 2022-10-09 NOTE — Discharge Summary (Signed)
Postpartum Discharge Summary    Patient Name: Morgan Collier DOB: Dec 01, 2000 MRN: [id number]  Date of admission: 10/09/2022 Delivery date:10/09/2022 Delivering provider: Thomasene Mohair D Date of discharge: 10/12/2022  Admitting diagnosis: History of cesarean delivery [Z98.891] Intrauterine pregnancy: [redacted]w[redacted]d     Secondary diagnosis:  Principal Problem:   History of cesarean delivery Active Problems:   Gestational diabetes   Supervision of high risk pregnancy in third trimester   [redacted] weeks gestation of pregnancy   Chronic hypertension  Additional problems:     Discharge diagnosis: Term Pregnancy Delivered and GDM A2                                              Post partum procedures:blood transfusion and IV Venofer Augmentation: N/A Complications: None  Hospital course: Sceduled C/S   22 y.o. yo G3P3003 at [redacted]w[redacted]d was admitted to the hospital 10/09/2022 for scheduled cesarean section with the following indication: history of cesarean delivery, elective repeat, GDMA2 uncontrolled .Delivery details are as follows:  Membrane Rupture Time/Date:  ,   Delivery Method:C-Section, Low Transverse Operative Delivery:N/A Details of operation can be found in separate operative note.  Patient had a postpartum course complicated byanemia.  She is ambulating, tolerating a regular diet, passing flatus, and urinating well. Patient is discharged home in stable condition on  10/12/22        Newborn Data: Birth date:10/09/2022 Birth time:8:41 AM Gender:Female Living status:Living Apgars:8 ,9  Weight:2980 g    Magnesium Sulfate received: No BMZ received: No Rhophylac:No MMR:No T-DaP: declined Flu: N/A Transfusion:Yes  Physical exam  Vitals:   10/11/22 2349 10/12/22 0226 10/12/22 0807 10/12/22 1130  BP: (!) 141/93 (!) 142/87 (!) 144/76 139/68  Pulse: 76 87 74 74  Resp: 18 18 18 18   Temp: 98.4 F (36.9 C) 98.8 F (37.1 C) 98.7 F (37.1 C) 98.4 F (36.9 C)  TempSrc: Oral Oral Oral Oral   SpO2: 99% 97% 96%   Weight:      Height:       General: alert, cooperative, and no distress Lochia: appropriate Uterine Fundus: firm Incision: Healing well with no significant drainage, No significant erythema, Dressing is clean, dry, and intact DVT Evaluation: No evidence of DVT seen on physical exam. Negative Homan's sign. No cords or calf tenderness. No significant calf/ankle edema. Labs: Lab Results  Component Value Date   WBC 9.3 10/12/2022   HGB 8.1 (L) 10/12/2022   HCT 25.6 (L) 10/12/2022   MCV 68.6 (L) 10/12/2022   PLT 249 10/12/2022      Latest Ref Rng & Units 08/21/2022    6:59 AM  CMP  Glucose 70 - 99 mg/dL 782   BUN 6 - 20 mg/dL <5   Creatinine 9.56 - 1.00 mg/dL 2.13   Sodium 086 - 578 mmol/L 132   Potassium 3.5 - 5.1 mmol/L 3.6   Chloride 98 - 111 mmol/L 107   CO2 22 - 32 mmol/L 22   Calcium 8.9 - 10.3 mg/dL 8.5   Total Protein 6.5 - 8.1 g/dL 6.8   Total Bilirubin 0.3 - 1.2 mg/dL 0.7   Alkaline Phos 38 - 126 U/L 56   AST 15 - 41 U/L 16   ALT 0 - 44 U/L 14    Edinburgh Score:    10/09/2022    9:30 PM  Edinburgh Postnatal Depression Scale Screening Tool  I have been able to laugh and see the funny side of things. 0  I have looked forward with enjoyment to things. 0  I have blamed myself unnecessarily when things went wrong. 2  I have been anxious or worried for no good reason. 3  I have felt scared or panicky for no good reason. 3  Things have been getting on top of me. 2  I have been so unhappy that I have had difficulty sleeping. 1  I have felt sad or miserable. 1  I have been so unhappy that I have been crying. 1  The thought of harming myself has occurred to me. 0  Edinburgh Postnatal Depression Scale Total 13   Risk assessment for postpartum VTE and prophylactic treatment: Very high risk factors: If any risk factors: 6 weeks LMHW and None High risk factors: If 1 risk factor, mechanical prophylaxis and early ambulation  and If > 1 risk factor  OR 1 risk factor + 1 moderate risk factor: 3-6 weeks of LMWH Moderate risk factors: If 3 or more risk factors: mechanical prophylaxis and early ambulation OR 3-6 weeks of LMWH, Cesarean delivery , and BMI 30-40 kg/m2  Postpartum VTE prophylaxis with LMWH not indicated    After visit meds:  Allergies as of 10/12/2022   No Known Allergies      Medication List     STOP taking these medications    acetaminophen 500 MG tablet Commonly known as: TYLENOL   glyBURIDE 2.5 MG tablet Commonly known as: DIABETA   prenatal multivitamin Tabs tablet       TAKE these medications    coconut oil Oil Apply 1 Application topically as needed.   dibucaine 1 % Oint Commonly known as: NUPERCAINAL Place 1 Application rectally as needed for hemorrhoids.   escitalopram 10 MG tablet Commonly known as: LEXAPRO Take 10 mg by mouth daily.   ferrous sulfate 325 (65 FE) MG tablet Take 1 tablet (325 mg total) by mouth 2 (two) times daily with a meal.   Flovent HFA 44 MCG/ACT inhaler Generic drug: fluticasone   fluticasone 50 MCG/ACT nasal spray Commonly known as: FLONASE Place 1 spray into both nostrils daily as needed.   ibuprofen 600 MG tablet Commonly known as: ADVIL Take 1 tablet (600 mg total) by mouth every 6 (six) hours.   NIFEdipine 60 MG 24 hr tablet Commonly known as: ADALAT CC Take 1 tablet (60 mg total) by mouth daily. Start taking on: October 13, 2022   oxyCODONE-acetaminophen 5-325 MG tablet Commonly known as: PERCOCET/ROXICET Take 1 tablet by mouth every 4 (four) hours as needed for moderate pain (pain score 4-7/10).   senna-docusate 8.6-50 MG tablet Commonly known as: Senokot-S Take 2 tablets by mouth daily. Start taking on: October 13, 2022   simethicone 80 MG chewable tablet Commonly known as: MYLICON Chew 1 tablet (80 mg total) by mouth as needed for flatulence.   valACYclovir 1000 MG tablet Commonly known as: VALTREX Take 1,000 mg by mouth 2 (two) times  daily. PRN for onset symptoms   witch hazel-glycerin pad Commonly known as: TUCKS Apply 1 Application topically as needed for hemorrhoids.         Discharge home in stable condition Infant Feeding: Breast Infant Disposition:home with mother Discharge instruction: per After Visit Summary and Postpartum booklet. Activity: Advance as tolerated. Pelvic rest for 6 weeks.  Diet: routine diet Anticipated Birth Control: Unsure Postpartum Appointment:6 weeks Additional Postpartum F/U: Incision check 1 week Future Appointments: Future Appointments  Date Time Provider Department Center  10/23/2022  1:00 PM Kathreen Cosier, LCSW [facility] None   Follow up Visit:  Follow-up Information     Conard Novak, MD. Schedule an appointment as soon as possible for a visit in 1 week(s).   Specialty: Obstetrics and Gynecology Why: For incision check Contact information: [address] [place] [address] [phone]                 SIGNED: Chari Manning CNM

## 2022-10-10 ENCOUNTER — Encounter: Payer: Self-pay | Admitting: Obstetrics and Gynecology

## 2022-10-10 DIAGNOSIS — I10 Essential (primary) hypertension: Secondary | ICD-10-CM | POA: Diagnosis present

## 2022-10-10 LAB — PREPARE RBC (CROSSMATCH)

## 2022-10-10 LAB — CBC
HCT: 22.3 % — ABNORMAL LOW (ref 36.0–46.0)
Hemoglobin: 6.8 g/dL — ABNORMAL LOW (ref 12.0–15.0)
MCH: 20.2 pg — ABNORMAL LOW (ref 26.0–34.0)
MCHC: 30.5 g/dL (ref 30.0–36.0)
MCV: 66.2 fL — ABNORMAL LOW (ref 80.0–100.0)
Platelets: 213 10*3/uL (ref 150–400)
RBC: 3.37 MIL/uL — ABNORMAL LOW (ref 3.87–5.11)
RDW: 19.5 % — ABNORMAL HIGH (ref 11.5–15.5)
WBC: 10.9 10*3/uL — ABNORMAL HIGH (ref 4.0–10.5)
nRBC: 1.1 % — ABNORMAL HIGH (ref 0.0–0.2)

## 2022-10-10 LAB — FETAL SCREEN: Fetal Screen: NEGATIVE

## 2022-10-10 LAB — HEMOGLOBIN AND HEMATOCRIT, BLOOD
HCT: 25.6 % — ABNORMAL LOW (ref 36.0–46.0)
Hemoglobin: 7.9 g/dL — ABNORMAL LOW (ref 12.0–15.0)

## 2022-10-10 MED ORDER — SIMETHICONE 80 MG PO CHEW
80.0000 mg | CHEWABLE_TABLET | ORAL | Status: DC | PRN
  Administered 2022-10-10 – 2022-10-11 (×2): 80 mg via ORAL
  Filled 2022-10-10: qty 1

## 2022-10-10 MED ORDER — SODIUM CHLORIDE 0.9% IV SOLUTION: Freq: Once | INTRAVENOUS | Status: AC

## 2022-10-10 MED ORDER — LABETALOL HCL 100 MG PO TABS
100.0000 mg | ORAL_TABLET | Freq: Two times a day (BID) | ORAL | Status: DC
  Administered 2022-10-10 (×2): 100 mg via ORAL
  Filled 2022-10-10 (×2): qty 1

## 2022-10-10 MED ORDER — RHO D IMMUNE GLOBULIN 1500 UNIT/2ML IJ SOSY
300.0000 ug | PREFILLED_SYRINGE | Freq: Once | INTRAMUSCULAR | Status: AC
  Administered 2022-10-10: 300 ug via INTRAVENOUS
  Filled 2022-10-10: qty 2

## 2022-10-10 NOTE — Progress Notes (Signed)
Patient ID: Morgan Collier, female   DOB: 2000/02/23, 22 y.o.   MRN: [id number]  Subjective: To room to evaluate patient due to significant decrease in hemoglobin and bleedng from incision overnight. Patient reports she felt slightly dizzy this morning walking prior to breakfast but that resolved after eating. Reports vaginal bleeding is light. Feels well overall other than tired. Breastfeeding now and that is going well. Reports was able to sleep some last night. Has not received her blood yet.    Objective:  BP 132/78 (BP Location: Left Arm)   Pulse 89   Temp 98.8 F (37.1 C) (Oral)   Resp 20   Ht 5\' 3"  (1.6 m)   Wt 87.1 kg   LMP 01/19/2022 (Exact Date)   SpO2 100%   Breastfeeding Unknown   BMI 34.01 kg/m   Gen: NAD HEENT: [place]/AT CV: Regular rate PULM: Normal work of breathing ABD: soft, appropriately tender, nondistended, fundus below umbilicus, no rebound or guarding Incision: Pressure dressing in place without strikethrough  EXT: No BLE edema   Assessment & Plan  Morgan Collier is a 22 y.o. K4M0102 who is POD1 s/p RLTCS.   #Acute blood loss anemia- clinically stable - Hb 10.7>6.8>1u ordered but has not been given yet. - Patient is well appearing and asymptomatic other than fatigue - Vitals normal other than mild hypertension consistent with known chronic hypertension - Abdominal exam reassuring - No concern for active bleeding at this time. Continue to monitor.  #Chronic hypertension - Upon chart review, patient has had many Bps outside of pregnancy and early in pregnancy >130/>80, consistent with chronic hypertension.  - Given intermittent elevated BP postpartum, started on labetalol 100 mg BID - CNM discussed importance of follow-up for this  See progress note from earlier for remainder of plan .  Romana Juniper, MD

## 2022-10-10 NOTE — Progress Notes (Signed)
Postop Day  1  Subjective: 22 y.o. [id number] postpartum day #1 status post repeat cesarean section. She is ambulating, is tolerating po, is voiding spontaneously.  Her pain is well controlled on PO pain medications. Her lochia is less than menses.  She reported several clots yesterday but improved.  States that there was a clot when the nurses did her fundal check this morning.  RN states that clot was less than the size of a quarter and lochia was small.   Objective: BP 130/73 (BP Location: Left Arm)   Pulse 88   Temp 97.6 F (36.4 C) (Oral)   Resp 18   Ht 5\' 3"  (1.6 m)   Wt 87.1 kg   LMP 01/19/2022 (Exact Date)   SpO2 100%   Breastfeeding Unknown   BMI 34.01 kg/m    Physical Exam:  General: alert, cooperative, and no distress Pulm: nl effort Abdomen: soft, non-tender Uterine Fundus: firm Incision: Covered with OP site, 1cm area of drainage seen mid incision Lochia: minimal   Recent Labs    10/07/22 1244 10/10/22 0536  HGB 10.7* 6.8*  HCT 35.5* 22.3*  WBC 8.5 10.9*  PLT 254 213    Assessment/Plan: 22 y.o. G3P3003 postpartum day # 1  Acute blood loss anemia - clinically significant.  -Asymptomatic - H&H decreased from 10.7/35.5 -> 6.8/22.3.  -Dressing was changed last night d/t saturation - scant drainage noted this morning  -Vaginal bleeding improved after TXA in PACU.  She has occasional small clots less than the size of a quarter.  Lochia is small.  -Discussed labs and assessment with Dr. Karie Schwalbe. Schermerhorn and Dr. Algis Downs. Schermerhorn.   -Intervention: recommend blood transfusion 1unit pRBC -Risk/beneifts discussed with Marzella.  Consent obtained.  -Will repeat H&H 1 hour post transfusion -Repeat CBC in AM    LOS: 1 day   Gustavo Lah, Ina Homes 10/10/2022, 7:42 AM   ----- Margaretmary Eddy  Certified Nurse Midwife [facility]

## 2022-10-10 NOTE — Lactation Note (Signed)
This note was copied from a baby's chart. Lactation Consultation Note  Patient Name: Boy Lita Campana [id number] Date: 10/10/2022 Age:24 hours Reason for consult: Early term 37-38.6wks;Follow-up assessment  Maternal Data Lactation to room for a follow up assessment.  Patient stated that breastfeeding is going well.  Baby "Elita Boone" likes to eat.  Infant just taken for a circumcision prior to entry into the room.  Patient is currently receiving blood.  Feeding Mother's Current Feeding Choice: Breast Milk  Interventions Education was provided to patient on engorgement treatment/prevention and breastmilk storage guidelines.  Patient verbalized understanding.  LC provided patient with community resources for extra breastfeeding support.  [facility] outpatient services was discussed with patient. Encouraged patient to reach out if she needed extra support.   Interventions: Breast feeding basics reviewed;Education;Guidelines for Milk Supply and Pumping Schedule Handout;CDC Guidelines for Breast Pump Cleaning  Discharge Discharge Education: Engorgement and breast care  Consult Status Consult Status: Follow-up Follow-up type: In-patient    Yvette Rack Free 10/10/2022, 2:15 PM

## 2022-10-10 NOTE — Progress Notes (Signed)
Post Partum Day 1 Subjective: Doing well, no complaints.  Tolerating regular diet, pain with PO meds, voiding and ambulating without difficulty.  No CP SOB Fever,Chills, N/V or leg pain; denies nipple or breast pain, no HA change of vision, RUQ/epigastric pain  Objective: BP (!) 144/86 (BP Location: Left Arm) Comment: nurse Skyler notified  Pulse 80   Temp 98.7 F (37.1 C) (Oral)   Resp 20   Ht 5\' 3"  (1.6 m)   Wt 87.1 kg   LMP 01/19/2022 (Exact Date)   SpO2 99%   Breastfeeding Unknown   BMI 34.01 kg/m    Vitals:   10/09/22 0940 10/09/22 0945 10/09/22 1050 10/09/22 1100  BP: (!) 142/98 (!) 137/90 131/89 (!) 125/93   10/09/22 1300 10/09/22 1400 10/09/22 1455 10/09/22 1910  BP: 120/85 (!) 125/90 (!) 137/97 (!) 140/90   10/09/22 2135 10/09/22 2335 10/10/22 0315 10/10/22 0745  BP: 129/82 137/74 130/73 (!) 144/86   Physical Exam:  General: NAD Breasts: soft/nontender  CV: RRR Pulm: nl effort, CTABL Abdomen: soft, NT, BS x 4 Incision: covered with OP site, 3cm area of drainage seen mid-incision Lochia: moderate Uterine Fundus: fundus firm and 1 fb below umbilicus, uterus palpated deep DVT Evaluation: no cords, ttp LEs   Recent Labs    10/07/22 1244 10/10/22 0536  HGB 10.7* 6.8*  HCT 35.5* 22.3*  WBC 8.5 10.9*  PLT 254 213    Assessment/Plan: 22 y.o. G3P3003 postpartum day # 1  - Continue routine PP care - Lactation consult PRN  - Discussed contraceptive options including implant, IUDs hormonal and non-hormonal, injection, pills/ring/patch, condoms, and NFP.  - Acute blood loss anemia, clinically significant - hemoglobin changed from 10.7 to 6.8, patient is asymptomatic, hemodynamically stable; start po ferrous sulfate BID with stool softeners, administer Venofer IV, patient receiving blood transfusion of 1u PRBCs and will repeat CBC 1hr after transfusion  - Pressure dressing to be placed on incision and will continue to monitor incision drainage - Elevated blood  pressures: will continue to monitor BP, denies headache/changes of vision/RUQ pain. If BP >135/85 on recheck, will start BP medications.  - GDM A2: blood sugars have been reassuring since delivery, do 6wk postpartum 2hr GTT - Immunization status: Needs varicella prior to DC  Disposition: Does not desire Dc home today.   [name], CNM 10/10/2022 9:25 AM

## 2022-10-10 NOTE — Progress Notes (Signed)
Blood transfusion paused due to loss of IV access, RN attempt x2, stat IV consult placed.

## 2022-10-10 NOTE — TOC Initial Note (Signed)
Transition of Care [facility]) - Initial/Assessment Note    Patient Details  Name: Morgan Collier MRN: [id number] Date of Birth: Aug 12, 2000  Transition of [facility]) CM/SW Contact:    Darolyn Rua, LCSW Phone Number: 10/10/2022, 12:32 PM  Clinical Narrative:                  [facility] consult for high depression score.   CSW spoke with patient who reports she has anxiety and depression. 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   Patient reports she took Zoloft in the past, currently on Lexapro.  Patient does currently go to therapy with Marchelle Folks virtually usually every two weeks.   Patient does report having support at home, her mother and the children's father.   No SI/HI.   Does have car seat and transport home. Reports no discharge needs at this time.    Expected Discharge Plan: Home/Self Care Barriers to Discharge: No Barriers Identified   Patient Goals and CMS Choice            Expected Discharge Plan and Services                                              Prior Living Arrangements/Services                       Activities of Daily Living Home Assistive Devices/Equipment: None ADL Screening (condition at time of admission) Patient's cognitive ability adequate to safely complete daily activities?: Yes Is the patient deaf or have difficulty hearing?: No Does the patient have difficulty seeing, even when wearing glasses/contacts?: No Does the patient have difficulty concentrating, remembering, or making decisions?: No Patient able to express need for assistance with ADLs?: Yes Does the patient have difficulty dressing or bathing?: No Independently performs ADLs?: Yes (appropriate for developmental age) Does the patient have difficulty walking or climbing stairs?: No Weakness of Legs: None Weakness of Arms/Hands: None  Permission Sought/Granted                  Emotional Assessment               Admission diagnosis:  History of cesarean delivery [Z98.891] Patient Active Problem List   Diagnosis Date Noted   Chronic hypertension 10/10/2022   History of cesarean delivery 10/09/2022   [redacted] weeks gestation of pregnancy 10/09/2022   Uterine contractions 09/24/2022   Uterine contractions at greater than 20 weeks of gestation 09/24/2022   Abdominal pain during pregnancy, antepartum 09/20/2022   Fall at home 09/17/2022   COVID-19 affecting pregnancy in third trimester 08/20/2022   Pregnancy 06/24/2022   Major depressive disorder, recurrent episode, moderate (HCC) 05/20/2022   Supervision of normal pregnancy 03/21/2022   Supervision of high risk pregnancy in third trimester 03/21/2022   Overweight BMI=27.4 07/09/2020   Gestational diabetes 07/09/2020   IBS (irritable bowel syndrome) 07/09/2020   Depression affecting pregnancy, postpartum 07/09/2020   Sepsis (HCC) 03/27/2019   Hypokalemia 03/27/2019   Asthma 03/27/2019   Hidradenitis suppurativa of right axilla 10/28/2016   PCP:  [facility] Pharmacy:   [facility] - [place], [place] - [address] [place] [address] Phone: [phone] Fax: [phone]     Social Determinants of Health (SDOH) Social History: SDOH  Screenings   Food Insecurity: No Food Insecurity (10/09/2022)  Housing: Low Risk  (10/09/2022)  Transportation Needs: No Transportation Needs (10/09/2022)  Utilities: Not At Risk (10/09/2022)  Depression (PHQ2-9): Low Risk  (06/26/2022)  Financial Resource Strain: Low Risk  (08/02/2018)  Physical Activity: Insufficiently Active (08/02/2018)  Social Connections: Moderately Integrated (08/02/2018)  Stress: No Stress Concern Present (08/02/2018)  Tobacco Use: Low Risk  (10/09/2022)   SDOH Interventions:     Readmission Risk Interventions     No data to display

## 2022-10-10 NOTE — Progress Notes (Signed)
Blood transfusion start: 1055 am  **Pressure dressing placed per Donato Schultz CNM prior to transfusion start due to continued drainage at incision site.

## 2022-10-10 NOTE — Progress Notes (Signed)
Post Partum Day 1  Subjective: Sitting and eating her lunch, blood transfusion infusing in the IV  Objective: BP 137/77   Pulse 83   Temp 98.4 F (36.9 C) (Oral)   Resp 18   Ht 5\' 3"  (1.6 m)   Wt 87.1 kg   LMP 01/19/2022 (Exact Date)   SpO2 98%   Breastfeeding Unknown   BMI 34.01 kg/m    Vitals:   10/09/22 1100 10/09/22 1300 10/09/22 1400 10/09/22 1455  BP: (!) 125/93 120/85 (!) 125/90 (!) 137/97   10/09/22 1910 10/09/22 2135 10/09/22 2335 10/10/22 0315  BP: (!) 140/90 129/82 137/74 130/73   10/10/22 0745 10/10/22 0928 10/10/22 1045 10/10/22 1110  BP: (!) 144/86 125/73 139/87 137/77    Physical Exam:  General: NAD Breasts: soft/nontender  CV: RRR Pulm: nl effort, CTABL Abdomen: soft, NT, BS x 4 Incision: Pressure Dsg CDI, no blood present, Lochia: mild Uterine Fundus: fundus firm and 1 fb below umbilicus  Recent Labs    10/10/22 0536  HGB 6.8*  HCT 22.3*  WBC 10.9*  PLT 213    Assessment/Plan: 22 y.o. G3P3003 postpartum day # 1  - Blood pressures: >2 blood pressures greater than 135/85, start Labetalol 100mg  BID - Pressure dressing intact, no blood present on pressure dressing, bleeding scant - Recheck CBC 1hr after blood transfusion completed  [name], CNM 10/10/2022  1:35 PM

## 2022-10-10 NOTE — Anesthesia Postprocedure Evaluation (Signed)
Anesthesia Post Note  Patient: Morgan Collier  Procedure(s) Performed: REPEAT CESAREAN SECTION  Patient location during evaluation: Mother Baby Anesthesia Type: Spinal Level of consciousness: oriented and awake and alert Pain management: pain level controlled Vital Signs Assessment: post-procedure vital signs reviewed and stable Respiratory status: spontaneous breathing and respiratory function stable Cardiovascular status: blood pressure returned to baseline and stable Postop Assessment: no headache, no backache, no apparent nausea or vomiting and able to ambulate Anesthetic complications: no   No notable events documented.   Last Vitals:  Vitals:   10/10/22 0315 10/10/22 0745  BP: 130/73 (!) 144/86  Pulse: 88 80  Resp: 18 20  Temp: 36.4 C 37.1 C  SpO2: 100% 99%    Last Pain:  Vitals:   10/10/22 0759  TempSrc:   PainSc: 9                  Mathews Argyle P

## 2022-10-10 NOTE — Progress Notes (Signed)
CSW remote today, called into room spoke with family at bedside who reports patient getting IV put in to call back in 10 min.   Darolyn Rua, [place], MSW, [place] [phone]

## 2022-10-10 NOTE — Progress Notes (Signed)
Blood transfusion complete.  H&H to be drawn one hour after- order placed for 1500.  Pressure dressing in place, site WDL.

## 2022-10-10 NOTE — Progress Notes (Signed)
Blood transfusion restarted with new IV site

## 2022-10-10 NOTE — Anesthesia Post-op Follow-up Note (Signed)
  Anesthesia Pain Follow-up Note  Patient: Morgan Collier  Day #: 1  Date of Follow-up: 10/10/2022 Time: 8:54 AM  Last Vitals:  Vitals:   10/10/22 0315 10/10/22 0745  BP: 130/73 (!) 144/86  Pulse: 88 80  Resp: 18 20  Temp: 36.4 C 37.1 C  SpO2: 100% 99%    Level of Consciousness: alert  Pain: mild   Side Effects:None  Catheter Site Exam:clean     Plan: D/C from anesthesia care at surgeon's request  Jules Schick

## 2022-10-11 LAB — BPAM RBC
Blood Product Expiration Date: 202410042359
ISSUE DATE / TIME: 202409131048
Unit Type and Rh: 600

## 2022-10-11 LAB — CBC
HCT: 25.3 % — ABNORMAL LOW (ref 36.0–46.0)
Hemoglobin: 7.9 g/dL — ABNORMAL LOW (ref 12.0–15.0)
MCH: 21.6 pg — ABNORMAL LOW (ref 26.0–34.0)
MCHC: 31.2 g/dL (ref 30.0–36.0)
MCV: 69.1 fL — ABNORMAL LOW (ref 80.0–100.0)
Platelets: 201 10*3/uL (ref 150–400)
RBC: 3.66 MIL/uL — ABNORMAL LOW (ref 3.87–5.11)
RDW: 21.5 % — ABNORMAL HIGH (ref 11.5–15.5)
WBC: 9.9 10*3/uL (ref 4.0–10.5)
nRBC: 1.5 % — ABNORMAL HIGH (ref 0.0–0.2)

## 2022-10-11 LAB — RHOGAM INJECTION: Unit division: 0

## 2022-10-11 LAB — TYPE AND SCREEN
ABO/RH(D): A NEG
Antibody Screen: POSITIVE
Extend sample reason: UNDETERMINED
Unit division: 0

## 2022-10-11 MED ORDER — SODIUM CHLORIDE 0.9 % IV SOLN
300.0000 mg | Freq: Once | INTRAVENOUS | Status: AC
  Administered 2022-10-11: 300 mg via INTRAVENOUS
  Filled 2022-10-11: qty 300

## 2022-10-11 MED ORDER — NIFEDIPINE ER OSMOTIC RELEASE 30 MG PO TB24
30.0000 mg | ORAL_TABLET | Freq: Every day | ORAL | Status: DC
  Administered 2022-10-11 – 2022-10-12 (×2): 30 mg via ORAL
  Filled 2022-10-11 (×2): qty 1

## 2022-10-11 MED ORDER — LABETALOL HCL 200 MG PO TABS: 200.0000 mg | ORAL_TABLET | Freq: Two times a day (BID) | ORAL | Status: DC

## 2022-10-11 NOTE — Lactation Note (Signed)
This note was copied from a baby's chart. Lactation Consultation Note  Patient Name: Morgan Collier [id number] Date: 10/11/2022 Age:24 hours Reason for consult: Follow-up assessment;Early term 37-38.6wks   Maternal Data Does the patient have breastfeeding experience prior to this delivery?: Yes How long did the patient breastfeed?: 3-4 mths  Feeding Mother's Current Feeding Choice: Breast Milk and Formula Mom states that baby is latching and nursing well, will be d/c'd tomorrow. LATCH Score Latch:  (I did not observe a feeding)                  Lactation Tools Discussed/Used    Interventions  LC name and no written on white board  Discharge Pump: Personal WIC Program: Yes  Consult Status Consult Status: PRN Date: 10/11/22 Follow-up type: In-patient    Dyann Kief 10/11/2022, 2:59 PM

## 2022-10-11 NOTE — Progress Notes (Signed)
Postop Day  2  Subjective: 22 y.o. [id number] postpartum day #2 status post repeat cesarean section. She is ambulating, is tolerating po, is voiding spontaneously.  Her pain is well controlled on PO pain medications. Her lochia is less than menses.  Objective: BP 126/63 (BP Location: Right Arm)   Pulse 66   Temp 97.8 F (36.6 C) (Oral)   Resp 17   Ht 5\' 3"  (1.6 m)   Wt 87.1 kg   LMP 01/19/2022 (Exact Date)   SpO2 100%   Breastfeeding Unknown   BMI 34.01 kg/m    Physical Exam:  General: alert, cooperative, and appears stated age Breasts: soft/nontender Pulm: nl effort Abdomen: soft, non-tender, active bowel sounds Uterine Fundus: firm Incision: healing well, no dehiscence, no significant erythema, some drainage to honeycomb dressing, not significant from prior assessment with outlined area.. Perineum: minimal edema, intact Lochia: appropriate DVT Evaluation: No evidence of DVT seen on physical exam. Negative Homan's sign. No cords or calf tenderness. No significant calf/ankle edema.  Recent Labs    10/10/22 0536 10/10/22 1502 10/11/22 0535  HGB 6.8* 7.9* 7.9*  HCT 22.3* 25.6* 25.3*  WBC 10.9*  --  9.9  PLT 213  --  201    Assessment/Plan: 22 y.o. G3P3003 postpartum day # 2  1. Continue routine postpartum care  2. Infant feeding status: breast feeding --Lactation consult PRN for breastfeeding   3. Contraception plan:  undecided  4. Acute blood loss anemia - clinically significant.  --Hemodynamically stable and  now asymptomatic --Intervention: continue on oral supplementation with ferrous sulfate 325, IV iron transfusion with venofer given , and received 1 unit pRBC's Postop day 1  5. Immunization status:   needs Varicella prior to discharge   6. Started on Procardia xl 30 mg daily for mild range blood pressures  Disposition: continue inpatient postpartum care , plan for discharge home tomorrow    LOS: 2 days    ----- Chari Manning Certified Nurse  Midwife [facility]

## 2022-10-12 LAB — CBC
HCT: 25.6 % — ABNORMAL LOW (ref 36.0–46.0)
Hemoglobin: 8.1 g/dL — ABNORMAL LOW (ref 12.0–15.0)
MCH: 21.7 pg — ABNORMAL LOW (ref 26.0–34.0)
MCHC: 31.6 g/dL (ref 30.0–36.0)
MCV: 68.6 fL — ABNORMAL LOW (ref 80.0–100.0)
Platelets: 249 10*3/uL (ref 150–400)
RBC: 3.73 MIL/uL — ABNORMAL LOW (ref 3.87–5.11)
RDW: 22.1 % — ABNORMAL HIGH (ref 11.5–15.5)
WBC: 9.3 10*3/uL (ref 4.0–10.5)
nRBC: 2.5 % — ABNORMAL HIGH (ref 0.0–0.2)

## 2022-10-12 MED ORDER — NIFEDIPINE ER OSMOTIC RELEASE 30 MG PO TB24: 60.0000 mg | ORAL_TABLET | Freq: Every day | ORAL | Status: DC

## 2022-10-12 MED ORDER — OXYCODONE-ACETAMINOPHEN 5-325 MG PO TABS: 1.0000 | ORAL_TABLET | ORAL | 0 refills | Status: AC | PRN

## 2022-10-12 MED ORDER — SIMETHICONE 80 MG PO CHEW: 80.0000 mg | CHEWABLE_TABLET | ORAL | Status: AC | PRN

## 2022-10-12 MED ORDER — SENNOSIDES-DOCUSATE SODIUM 8.6-50 MG PO TABS: 2.0000 | ORAL_TABLET | ORAL | Status: AC

## 2022-10-12 MED ORDER — FERROUS SULFATE 325 (65 FE) MG PO TABS: 325.0000 mg | ORAL_TABLET | Freq: Two times a day (BID) | ORAL | Status: AC

## 2022-10-12 MED ORDER — DIBUCAINE (PERIANAL) 1 % EX OINT: 1.0000 | TOPICAL_OINTMENT | CUTANEOUS | Status: AC | PRN

## 2022-10-12 MED ORDER — WITCH HAZEL-GLYCERIN EX PADS: 1.0000 | MEDICATED_PAD | CUTANEOUS | Status: AC | PRN

## 2022-10-12 MED ORDER — COCONUT OIL OIL: 1.0000 | TOPICAL_OIL | Status: AC | PRN

## 2022-10-12 MED ORDER — IBUPROFEN 600 MG PO TABS: 600.0000 mg | ORAL_TABLET | Freq: Four times a day (QID) | ORAL | 0 refills | Status: AC

## 2022-10-12 MED ORDER — NIFEDIPINE ER 60 MG PO TB24: 60.0000 mg | ORAL_TABLET | Freq: Every day | ORAL | 3 refills | Status: AC

## 2022-10-12 MED ORDER — MEDROXYPROGESTERONE ACETATE 150 MG/ML IM SUSP
150.0000 mg | INTRAMUSCULAR | Status: DC
  Filled 2022-10-12: qty 1

## 2022-10-12 MED ORDER — NIFEDIPINE ER OSMOTIC RELEASE 30 MG PO TB24
30.0000 mg | ORAL_TABLET | Freq: Once | ORAL | Status: AC
  Administered 2022-10-12: 30 mg via ORAL
  Filled 2022-10-12: qty 1

## 2022-10-12 MED ORDER — MEDROXYPROGESTERONE ACETATE 150 MG/ML IM SUSP: 150.0000 mg | INTRAMUSCULAR | 3 refills | Status: DC

## 2022-10-12 NOTE — Progress Notes (Signed)
Discharge instructions reviewed with patient and her significant other. Follow up care reviewed and questions answered.  Printed copies given to patient for reference after discharge home.

## 2022-10-15 ENCOUNTER — Telehealth: Payer: Self-pay

## 2022-10-15 DIAGNOSIS — Z98891 History of uterine scar from previous surgery: Secondary | ICD-10-CM | POA: Diagnosis not present

## 2022-10-15 DIAGNOSIS — M7989 Other specified soft tissue disorders: Secondary | ICD-10-CM | POA: Diagnosis not present

## 2022-10-15 DIAGNOSIS — I10 Essential (primary) hypertension: Secondary | ICD-10-CM | POA: Diagnosis not present

## 2022-10-15 DIAGNOSIS — R238 Other skin changes: Secondary | ICD-10-CM | POA: Diagnosis not present

## 2022-10-15 NOTE — Telephone Encounter (Signed)
[facility]- Discharge Call Backs- Spoke to patient on the phone about the following below. 1-Do you have any questions or concerns about yourself as you heal?No 2-Any concerns or questions about your baby?No Is your baby eating, peeing,pooping well?Yes 3-Reviewed ABC's of safe sleep. 4-How was your stay at the hospital?Great 5- Did our team work together to care for you?Yes You should be receiving a survey in the mail soon.   We would really appreciate it if you could fill that out for [place] and return it in the mail.  We value the feedback to make improvements and continue the great work we do.   If you have any questions please feel free to call me back at [phone]

## 2022-10-23 ENCOUNTER — Ambulatory Visit: Payer: Medicaid Other | Admitting: Licensed Clinical Social Worker

## 2022-10-23 DIAGNOSIS — F53 Postpartum depression: Secondary | ICD-10-CM | POA: Diagnosis not present

## 2022-10-23 DIAGNOSIS — F331 Major depressive disorder, recurrent, moderate: Secondary | ICD-10-CM

## 2022-10-23 NOTE — Progress Notes (Signed)
Counselor/Therapist Progress Note  Patient ID: Morgan Collier, MRN: [id number],    Date: 10/23/2022  Time Spent: 32 minutes    Treatment Type: Individual Therapy  Reported Symptoms:  depressed mood, anhedonia, sleep disturbance, poor appetite   Mental Status Exam:  Appearance:   NA     Behavior:  Appropriate, Sharing, and Motivated  Motor:  NA   Speech/Language:   Clear and Coherent and Normal Rate  Affect:  NA  Mood:  sad  Thought process:  normal  Thought content:    WNL  Sensory/Perceptual disturbances:    WNL  Orientation:  oriented to person, place, time/date, situation, and day of week  Attention:  Good  Concentration:  Good  Memory:  WNL  Fund of knowledge:   Good  Insight:    Fair  Judgment:   Fair  Impulse Control:  Fair   Risk Assessment: Danger to Self:  No Self-injurious Behavior: No Danger to Others: No Duty to Warn:no Physical Aggression / Violence:No  Access to Firearms a concern: No  Gang Involvement:No   TELEHEALTH VIRTUAL MOOD VISIT ENCOUNTER NOTE  I connected with Morgan Collier on 10/23/22 by telephone and verified that I am speaking with the correct person using two identifiers.   I discussed the limitations, risks, security and privacy concerns of performing an evaluation and management service by telephone and the availability of in person appointments. I also discussed with the patient that there may be a patient responsible charge related to this service. The patient expressed understanding and agreed to proceed.  LCSW introduced self and established psychological safety.  Patient verbally consented to this telephonic session.   Patient is located at home  LCSW located at [facility]   Time visit started: 1pm  Time visit ended: 1:32pm   SUBJECTIVE: Patient clinic or provider recommended a 2 week mood check due to risk factors for postpartum mood disorder. Patient delivered on 10/09/2022 she reports the delivery went fine, although she does  report being put to sleep and having a blood transfusion, she denies any of this feeling traumatic. She also reports that she has been having pain in her incision, and had not been having this pain last week, she will see her provider today to discuss. Patient reports that since Monday her mood has not been good, she has lost her appetite, feeling depressed, and isn't getting enough sleep. Patient reports that last week she was doing well, but she and her boyfriend have had some challenges and it has led to these mood symptoms.   ASSESSMENT: Patient presents to this session / mood check appointment with depressed mood that she reports began on Monday. She endorsees symptoms of depressed mood, including, depressed mood, anhedonia, sadness, and loss of appetite.She reports that her mood was stable following the delivery of her baby, but since Monday she began feeling depressed due to psychosocial stressors- conflict in relationship with boyfriend/father of baby due to his infidelity and his priorities not being what she thinks they ought to be. Patient also reports sleep disturbance due to availability of sleep opportunity, she reports that she is able to sleep when baby sleeps, but baby is waking every 1.5-2 hours. Patient reports that she was fully breastfeeding but her supply has decreased due to low appetite, she is now supplementing with formula which she reports she is fine with.   Patient may benefit from continued talk therapy and medication management.  PRESENTING CONCERNS: Patient and/or family reports the following symptoms/concerns: Depressed mood,  low appetite.  Duration of problem: 4 days; Severity of problem: moderate  STRENGTHS (Protective Factors/Coping Skills): Support from her mom and the father of the baby. Patient voices agreement with sleep plan/schedule for her to care for baby for four hours at a time and the father of baby care for baby alternating so patient can increase sleep/  have protected sleep time. She also voices agreement with eating a few bites of food (protein) every hour or two regarless of appetite, and increasing liquids. Patient also reports she would be talk about medication options with her OB provider at an appointment scheduled for today.   INTERVENTIONS: Interventions utilized:  Supportive Counseling and Psychoeducation and/or Health Education Standardized Assessments completed:  did not use.   Conducted brief assessment  Provide brief psychoeducation on postpartum mood and anxiety disorders signs and symptoms, including information on Baby Blues, Postpartum Depression, and Postpartum Anxiety.   Discussed self-care strategies to prevent and/or reduce symptoms of postpartum mood and anxiety disorders, including sleep hygiene, eating regularly, drinking fluids, getting outside, and support from partner, family, and/or friends.   Informed patient to call her provider right away or get emergency help if she experiences any of the following symptoms: Feelings of hopelessness and total despair. Feeling out of touch with reality (hearing or seeing things other people don't). Feeling that you might hurt yourself or your baby.  PLAN: Future Appointments  Date Time Provider Department Center  11/04/2022 10:00 AM Kathreen Cosier, LCSW [facility] None   Diagnosis:   ICD-10-CM   1. Major depressive disorder, recurrent episode, moderate (HCC)  F33.1       Kathreen Cosier, LCSW

## 2022-11-04 ENCOUNTER — Ambulatory Visit: Payer: Medicaid Other | Admitting: Licensed Clinical Social Worker

## 2022-11-25 ENCOUNTER — Ambulatory Visit: Payer: Medicaid Other | Admitting: Licensed Clinical Social Worker

## 2022-11-25 DIAGNOSIS — Z3200 Encounter for pregnancy test, result unknown: Secondary | ICD-10-CM | POA: Diagnosis not present

## 2022-11-25 DIAGNOSIS — O24414 Gestational diabetes mellitus in pregnancy, insulin controlled: Secondary | ICD-10-CM | POA: Diagnosis not present

## 2022-11-25 DIAGNOSIS — Z1332 Encounter for screening for maternal depression: Secondary | ICD-10-CM | POA: Diagnosis not present

## 2022-11-25 DIAGNOSIS — F331 Major depressive disorder, recurrent, moderate: Secondary | ICD-10-CM

## 2022-11-25 NOTE — Progress Notes (Signed)
Counselor/Therapist Progress Note  Patient ID: Morgan Collier, MRN: [id number],    Date: 11/25/2022  Time Spent: 22 minutes (pt. Requested a short session due to appt. Conflict)   Treatment Type: Psychotherapy  Reported Symptoms:  Sleep disturbance, mood is up and down- better since taking meds; anhedonia, low motivation, crying, irritability   Mental Status Exam:  Appearance:   Casual     Behavior:  Appropriate, Sharing, and Motivated  Motor:  Normal  Speech/Language:   Clear and Coherent and Normal Rate  Affect:  Appropriate and Congruent  Mood:  normal  Thought process:  normal  Thought content:    WNL  Sensory/Perceptual disturbances:    WNL  Orientation:  oriented to person, place, time/date, situation, and day of week  Attention:  Good  Concentration:  Good  Memory:  WNL  Fund of knowledge:   Good  Insight:    Fair  Judgment:   Fair  Impulse Control:  Fair   Risk Assessment: Danger to Self:  No Self-injurious Behavior: No Danger to Others: No Duty to Warn:no Physical Aggression / Violence:No  Access to Firearms a concern: No  Gang Involvement:No   Subjective: Patient was engaged and cooperative throughout the session using time effectively to discuss thoughts and feelings and coping skills. Patient reports that her mood has been better since starting medications but still has some good and bad days. She reports that on her bad days she crys a lot, has difficulty getting out of bed and is more irritable. She reports that she is going back to work on Monday and is excited, but does have some anxiety. Patient also reports good support from her boyfriend/father of the children. Patient was receptive to feedback and intervention from LCSW.   Interventions: Cognitive Behavioral Therapy and Client Centered; Triple P Established psychological safety. LCSW met with patient to identify needs related to stressors and functioning, and assess and monitor for signs and symptoms  of depression and anxiety, and assess safety. Checked in with patient and processed with the patient how they have been doing since the last follow-up session. LCSW assisted patient in processing their thoughts and emotions regarding challenges with juggling time between each of her children. LCSW explored her perceptions, reframed expectations, and assisted patient in identifying ways of spending more quality time with her children through regular daily tasks, such as dinner and bath time. Provided support through active listening, validation of feelings, and highlighted patient's strengths.  Diagnosis:   ICD-10-CM   1. Major depressive disorder, recurrent episode, moderate (HCC)  F33.1      Plan: Patient's goal of treatment is to learn to cope with everything and to try to have a positive outlook on everything, even te bad. And another goal is to learn how to express herself and open up more.  Treatment Target: Understand the relationship between thoughts, emotions, and behaviors  Psychoeducation on CBTs model   Oriented the client to the therapeutic approach Teach the connection between thoughts, emotions, and behaviors  Treatment Target: Increase realistic balanced thinking -to learn how to replace thinking with thoughts that are more accurate or helpful Explore patient's thoughts, beliefs, automatic thoughts, assumptions  Identify and replace unhelpful thinking patterns (upsetting ideas, self-talk and mental images) Process distress and allow for emotional release  Questioning and challenging thoughts Cognitive reappraisal  Restructuring, Socratic questioning  Treatment Target: Creating a life worth living  Values clarification   Self-care - nutrition, sleep, exercise  Mindfulness skills Interpersonal Effectiveness  Emotion  Regulation Distress Tolerance Treatment Target: Increase emotional regulation  Coping skills to manage anxiety   Teach distress tolerance techniques - "what helps  me"  Opposite action PLEASE  skills or self-care skills  Self-soothing  Cope ahead skills - imagery, rehearsal, problem-solving, exposure   Assertiveness communication Future Appointments  Date Time Provider Department Center  12/09/2022  1:00 PM Kathreen Cosier, LCSW [facility] None   Kathreen Cosier, LCSW

## 2022-12-05 DIAGNOSIS — T8189XA Other complications of procedures, not elsewhere classified, initial encounter: Secondary | ICD-10-CM | POA: Diagnosis not present

## 2022-12-09 ENCOUNTER — Ambulatory Visit: Payer: Medicaid Other | Admitting: Licensed Clinical Social Worker

## 2022-12-09 DIAGNOSIS — F331 Major depressive disorder, recurrent, moderate: Secondary | ICD-10-CM

## 2022-12-09 NOTE — Progress Notes (Signed)
Counselor/Therapist Progress Note  Patient ID: Morgan Collier, MRN: [id number],    Date: 12/09/2022  Time Spent: 10 minutes    Treatment Type: Psychotherapy  Reported Symptoms:  "good" patient denies any symptoms of anxiety or depressed mood   Mental Status Exam:  Appearance:   Casual and Neat     Behavior:  Appropriate and Sharing, minimal participation   Motor:  Normal  Speech/Language:   Clear and Coherent and Normal Rate  Affect:  Appropriate, Congruent, and Full Range  Mood:  normal  Thought process:  normal  Thought content:    WNL  Sensory/Perceptual disturbances:    WNL  Orientation:  oriented to person, place, time/date, situation, and day of week  Attention:  Good  Concentration:  Good  Memory:  WNL  Fund of knowledge:   Good  Insight:    Fair  Judgment:   Fair  Impulse Control:  Fair   Risk Assessment: Danger to Self:  No Self-injurious Behavior: No Danger to Others: No Duty to Warn:no Physical Aggression / Violence:No  Access to Firearms a concern: No  Gang Involvement:No   Subjective: Patient was engaged and cooperative throughout the session. Patient shares that everything has been good and her mood has been good..."really smooth". She reports she is back to work and her boyfriend has been supportive and her major stress of money has been resolved. Patient also reports that journaling has been helpful. In addition, she reports taking Lexapro as prescribed. Patient in agreement with terminating treatment at this time and voices understanding that she can resume services in the future, if needed.   Interventions: Cognitive Behavioral Therapy and Client Centered  LCSW met with patient to identify needs related to stressors and functioning, and assess and monitor for signs and symptoms of depression and anxiety, and assess safety. Checked in with patient regarding how they have been doing since the last follow-up session. LCSW explored patient's experience with  improvement in mood, encouraging her to identify what's different in her life that is supporting her mood currently. Reviewed self-care, including journaling, and noticing anxious thoughts-catastrophizing. Discussed termination of treatment with patient and encouraged patient to reach out for services in the future, as needed.   Diagnosis:   ICD-10-CM   1. Major depressive disorder, recurrent episode, moderate (HCC)  F33.1      Plan: Patient to follow up as needed.   No future appointments.   Kathreen Cosier, LCSW

## 2022-12-17 DIAGNOSIS — Z3202 Encounter for pregnancy test, result negative: Secondary | ICD-10-CM | POA: Diagnosis not present

## 2023-02-24 DIAGNOSIS — Z113 Encounter for screening for infections with a predominantly sexual mode of transmission: Secondary | ICD-10-CM | POA: Diagnosis not present

## 2023-02-24 DIAGNOSIS — N9489 Other specified conditions associated with female genital organs and menstrual cycle: Secondary | ICD-10-CM | POA: Diagnosis not present

## 2023-02-24 DIAGNOSIS — N949 Unspecified condition associated with female genital organs and menstrual cycle: Secondary | ICD-10-CM | POA: Diagnosis not present

## 2023-03-12 DIAGNOSIS — J4520 Mild intermittent asthma, uncomplicated: Secondary | ICD-10-CM | POA: Diagnosis not present

## 2023-03-12 DIAGNOSIS — M549 Dorsalgia, unspecified: Secondary | ICD-10-CM | POA: Diagnosis not present

## 2023-03-12 DIAGNOSIS — Z0000 Encounter for general adult medical examination without abnormal findings: Secondary | ICD-10-CM | POA: Diagnosis not present

## 2023-03-13 DIAGNOSIS — Z0000 Encounter for general adult medical examination without abnormal findings: Secondary | ICD-10-CM | POA: Diagnosis not present

## 2023-03-13 DIAGNOSIS — J4520 Mild intermittent asthma, uncomplicated: Secondary | ICD-10-CM | POA: Diagnosis not present

## 2023-03-13 DIAGNOSIS — M549 Dorsalgia, unspecified: Secondary | ICD-10-CM | POA: Diagnosis not present

## 2023-04-02 DIAGNOSIS — M549 Dorsalgia, unspecified: Secondary | ICD-10-CM | POA: Diagnosis not present

## 2023-04-24 DIAGNOSIS — M549 Dorsalgia, unspecified: Secondary | ICD-10-CM | POA: Diagnosis not present

## 2023-06-04 DIAGNOSIS — O99519 Diseases of the respiratory system complicating pregnancy, unspecified trimester: Secondary | ICD-10-CM | POA: Diagnosis not present

## 2023-06-04 DIAGNOSIS — J45909 Unspecified asthma, uncomplicated: Secondary | ICD-10-CM | POA: Diagnosis not present

## 2023-06-04 DIAGNOSIS — M549 Dorsalgia, unspecified: Secondary | ICD-10-CM | POA: Diagnosis not present

## 2023-08-31 DIAGNOSIS — Z1331 Encounter for screening for depression: Secondary | ICD-10-CM | POA: Diagnosis not present

## 2023-08-31 DIAGNOSIS — F411 Generalized anxiety disorder: Secondary | ICD-10-CM | POA: Diagnosis not present

## 2023-08-31 DIAGNOSIS — J4520 Mild intermittent asthma, uncomplicated: Secondary | ICD-10-CM | POA: Diagnosis not present

## 2023-08-31 DIAGNOSIS — M549 Dorsalgia, unspecified: Secondary | ICD-10-CM | POA: Diagnosis not present

## 2023-08-31 DIAGNOSIS — F331 Major depressive disorder, recurrent, moderate: Secondary | ICD-10-CM | POA: Diagnosis not present

## 2023-11-02 DIAGNOSIS — J029 Acute pharyngitis, unspecified: Secondary | ICD-10-CM | POA: Diagnosis not present

## 2023-11-02 DIAGNOSIS — Z03818 Encounter for observation for suspected exposure to other biological agents ruled out: Secondary | ICD-10-CM | POA: Diagnosis not present

## 2023-11-02 DIAGNOSIS — J028 Acute pharyngitis due to other specified organisms: Secondary | ICD-10-CM | POA: Diagnosis not present

## 2023-11-02 DIAGNOSIS — J019 Acute sinusitis, unspecified: Secondary | ICD-10-CM | POA: Diagnosis not present

## 2023-11-14 DIAGNOSIS — J4520 Mild intermittent asthma, uncomplicated: Secondary | ICD-10-CM | POA: Diagnosis not present

## 2023-11-14 DIAGNOSIS — F330 Major depressive disorder, recurrent, mild: Secondary | ICD-10-CM | POA: Diagnosis not present

## 2023-11-20 ENCOUNTER — Telehealth: Payer: Self-pay

## 2023-11-20 DIAGNOSIS — F331 Major depressive disorder, recurrent, moderate: Secondary | ICD-10-CM

## 2023-11-24 ENCOUNTER — Telehealth: Payer: Self-pay | Admitting: *Deleted

## 2023-11-24 NOTE — Progress Notes (Signed)
 Complex Care Management Note  Care Guide Note 11/24/2023 Name: [name] MRN: [id number] DOB: 18-Oct-2000  Morgan Collier is a 23 y.o. year old female who sees [facility] for primary care. I reached out to Morgan Collier by phone today to offer complex care management services.  Ms. Petz was given information about Complex Care Management services today including:   The Complex Care Management services include support from the care team which includes your Nurse Care Manager, Clinical Social Worker, or Pharmacist.  The Complex Care Management team is here to help remove barriers to the health concerns and goals most important to you. Complex Care Management services are voluntary, and the patient may decline or stop services at any time by request to their care team member.   Complex Care Management Consent Status: Patient agreed to services and verbal consent obtained.   Follow up plan:  Telephone appointment with complex care management team member scheduled for:  12/28/2023  Encounter Outcome:  Patient Scheduled  Thedford Franks, CMA [place]  [facility], [facility] Guide Direct Dial: [phone]  Fax: [phone] Website: [email or web address]

## 2023-12-22 DIAGNOSIS — J4540 Moderate persistent asthma, uncomplicated: Secondary | ICD-10-CM | POA: Diagnosis not present

## 2023-12-22 DIAGNOSIS — F331 Major depressive disorder, recurrent, moderate: Secondary | ICD-10-CM | POA: Diagnosis not present

## 2023-12-22 DIAGNOSIS — F411 Generalized anxiety disorder: Secondary | ICD-10-CM | POA: Diagnosis not present

## 2023-12-28 ENCOUNTER — Other Ambulatory Visit: Payer: Self-pay | Admitting: Licensed Clinical Social Worker

## 2023-12-29 DIAGNOSIS — R5383 Other fatigue: Secondary | ICD-10-CM | POA: Diagnosis not present

## 2023-12-29 DIAGNOSIS — K219 Gastro-esophageal reflux disease without esophagitis: Secondary | ICD-10-CM | POA: Diagnosis not present

## 2023-12-29 DIAGNOSIS — J4540 Moderate persistent asthma, uncomplicated: Secondary | ICD-10-CM | POA: Diagnosis not present

## 2023-12-29 DIAGNOSIS — R0683 Snoring: Secondary | ICD-10-CM | POA: Diagnosis not present

## 2023-12-29 DIAGNOSIS — G479 Sleep disorder, unspecified: Secondary | ICD-10-CM | POA: Diagnosis not present

## 2023-12-29 NOTE — Patient Instructions (Signed)
 Visit Information  Ms. Crosley was given information about Medicaid Managed Care team care coordination services as a part of their [place] benefit.   If you would like to schedule transportation through your [place] plan, please call the following number at least 2 days in advance of your appointment: [phone].   There is no limit to the number of trips during the year between medical appointments, healthcare facilities, or pharmacies. Transportation must be scheduled at least 2 business days before but not more than thirty 30 days before of your appointment.  Call the Behavioral Health Crisis Line at [phone], at any time, 24 hours a day, 7 days a week. If you are in danger or need immediate medical attention call 911.   Please see education materials related to topics discussed provided by MyChart link.  Patient verbalizes understanding of instructions and care plan provided today and agrees to view in MyChart. Active MyChart status and patient understanding of how to access instructions and care plan via MyChart confirmed with patient.     Licensed Clinical Social Worker will call you on 12/30 at 12:30 PM  Rolin Kerns, LCSW [place]  [facility], [facility] Clinical Social Worker Direct Dial: [phone]  Fax: [phone] Website: [email or web address] 12:12 PM   Following is a copy of your plan of care:  There are no care plans that you recently modified to display for this patient.

## 2023-12-29 NOTE — Patient Outreach (Signed)
 Complex Care Management   Visit Note  12/29/2023  Name:  Morgan Collier MRN: [id number] DOB: 09/25/2000  Situation: Referral received for Complex Care Management related to Mental/Behavioral Health diagnosis MDD/GAD I obtained verbal consent from Patient.  Visit completed with Patient  on the phone  Background:   Past Medical History:  Diagnosis Date   Anemia    Asthma    Seasonal   First trimester screening    Gestational diabetes    Hidradenitis suppurativa of right axilla    History of anemia    Labor and delivery indication for care or intervention 08/02/2018   Labor and delivery, indication for care 08/02/2018   Normal labor and delivery 08/02/2018   Pneumonia    covid pna    Assessment: Patient Reported Symptoms:  Cognitive Cognitive Status: No symptoms reported, Normal speech and language skills Cognitive/Intellectual Conditions Management [RPT]: None reported or documented in medical history or problem list   Health Maintenance Behaviors: Annual physical exam  Neurological Neurological Review of Symptoms: No symptoms reported    HEENT HEENT Symptoms Reported: No symptoms reported      Cardiovascular Cardiovascular Symptoms Reported: No symptoms reported    Respiratory Respiratory Symptoms Reported: Shortness of breath Additional Respiratory Details: Pt has an upcoming appt with Pulmonology on 12/02 to manage asthma Respiratory Management Strategies: Coping strategies, Medication therapy  Endocrine Endocrine Symptoms Reported: No symptoms reported Is patient diabetic?: No    Gastrointestinal Gastrointestinal Symptoms Reported: No symptoms reported      Genitourinary Genitourinary Symptoms Reported: No symptoms reported    Integumentary Integumentary Symptoms Reported: No symptoms reported    Musculoskeletal Musculoskelatal Symptoms Reviewed: Back pain Musculoskeletal Management Strategies: Coping strategies, Routine screening Musculoskeletal Comment: Pt  has a hx of ongoing back pain, takes OTC meds. Patient stretches a lot Falls in the past year?: No Number of falls in past year: 1 or less Was there an injury with Fall?: No Fall Risk Category Calculator: 0 Patient Fall Risk Level: Low Fall Risk Fall risk Follow up: Falls prevention discussed  Psychosocial Psychosocial Symptoms Reported: Alteration in sleep habits, Depression - if selected complete PHQ 2-9, Anxiety - if selected complete GAD, Difficulty concentrating Behavioral Management Strategies: Adequate rest, Support system, Coping strategies, Medication therapy Major Change/Loss/Stressor/Fears (CP): Medical condition, self, School or job Techniques to [employer] with Loss/Stress/Change: Diversional activities, Medication, Counseling Quality of Family Relationships: helpful, involved, supportive Do you feel physically threatened by others?: No    12/29/2023    PHQ2-9 Depression Screening   Little interest or pleasure in doing things    Feeling down, depressed, or hopeless    PHQ-2 - Total Score    Trouble falling or staying asleep, or sleeping too much    Feeling tired or having little energy    Poor appetite or overeating     Feeling bad about yourself - or that you are a failure or have let yourself or your family down    Trouble concentrating on things, such as reading the newspaper or watching television    Moving or speaking so slowly that other people could have noticed.  Or the opposite - being so fidgety or restless that you have been moving around a lot more than usual    Thoughts that you would be better off dead, or hurting yourself in some way    PHQ2-9 Total Score    If you checked off any problems, how difficult have these problems made it for you to do your work,  take care of things at home, or get along with other people    Depression Interventions/Treatment      There were no vitals filed for this visit.    Medications Reviewed Today     Reviewed by Roberto Hlavaty  D, LCSW (Social Worker) on 12/28/23 at 1314  Med List Status: <None>   Medication Order Taking? Sig Documenting Provider Last Dose Status Informant  coconut oil OIL [id number]  Apply 1 Application topically as needed.  Patient not taking: Reported on 12/28/2023   Dickerson, Felicia, CNM  Active   dibucaine (NUPERCAINAL) 1 % OINT [id number]  Place 1 Application rectally as needed for hemorrhoids.  Patient not taking: Reported on 12/28/2023   Dickerson, Felicia, CNM  Active   escitalopram  (LEXAPRO ) 10 MG tablet [id number] Yes Take 10 mg by mouth daily. [provider]  Active Self  ferrous sulfate  325 (65 FE) MG tablet [id number] Yes Take 1 tablet (325 mg total) by mouth 2 (two) times daily with a meal. Aisha Heller, CNM  Active   FLOVENT  HFA 44 MCG/ACT inhaler [id number]    Patient not taking: Reported on 12/28/2023   [provider]  Active Self  fluticasone  (FLONASE ) 50 MCG/ACT nasal spray [id number] Yes Place 1 spray into both nostrils daily as needed.  [provider]  Active Self  ibuprofen  (ADVIL ) 600 MG tablet [id number]  Take 1 tablet (600 mg total) by mouth every 6 (six) hours.  Patient not taking: Reported on 12/28/2023   Dickerson, Felicia, CNM  Active   NIFEdipine  (ADALAT  CC) 60 MG 24 hr tablet [id number]  Take 1 tablet (60 mg total) by mouth daily.  Patient not taking: Reported on 12/28/2023   Aisha Heller, CNM  Expired 10/13/23 2359   oxyCODONE -acetaminophen  (PERCOCET/ROXICET) 5-325 MG tablet [id number]  Take 1 tablet by mouth every 4 (four) hours as needed for moderate pain (pain score 4-7/10).  Patient not taking: Reported on 12/28/2023   Aisha Heller, CNM  Active   senna-docusate (SENOKOT-S) 8.6-50 MG tablet [id number]  Take 2 tablets by mouth daily.  Patient not taking: Reported on 12/28/2023   Aisha Heller, CNM  Active   simethicone  (MYLICON) 80 MG chewable tablet [id number]  Chew 1 tablet (80 mg total) by mouth as needed for flatulence.   Patient not taking: Reported on 12/28/2023   Dickerson, Felicia, CNM  Active   valACYclovir (VALTREX) 1000 MG tablet [id number] Yes Take 1,000 mg by mouth 2 (two) times daily. PRN for onset symptoms [provider]  Active Self  witch hazel-glycerin  (TUCKS) pad [id number]  Apply 1 Application topically as needed for hemorrhoids.  Patient not taking: Reported on 12/28/2023   Dickerson, Felicia, CNM  Active             Recommendation:   Continue Current Plan of Care  Follow Up Plan:   Telephone follow-up in 1 month  Rolin Kerns, LCSW [place]  [facility], [facility] Clinical Social Worker Direct Dial: [phone]  Fax: [phone] Website: [email or web address] 12:11 PM

## 2024-01-26 ENCOUNTER — Other Ambulatory Visit: Payer: Self-pay | Admitting: Licensed Clinical Social Worker

## 2024-01-26 NOTE — Patient Instructions (Signed)
 Visit Information  [name] was given information about Medicaid Managed Care team care coordination services as a part of their [place] Medicaid benefit.   If you would like to schedule transportation through your [place] plan, please call the following number at least 2 days in advance of your appointment: [phone].   There is no limit to the number of trips during the year between medical appointments, healthcare facilities, or pharmacies. Transportation must be scheduled at least 2 business days before but not more than thirty 30 days before of your appointment.  Call the Behavioral Health Crisis Line at [phone], at any time, 24 hours a day, 7 days a week. If you are in danger or need immediate medical attention call 911.   Please see education materials related to topics discussed provided by MyChart link.  Patient verbalizes understanding of instructions and care plan provided today and agrees to view in MyChart. Active MyChart status and patient understanding of how to access instructions and care plan via MyChart confirmed with patient.     Licensed Clinical Social Worker will 02/02 at 12:30 PM  Rolin Kerns, LCSW [place]  [facility], [facility] Clinical Social Worker Direct Dial: [phone]  Fax: [phone] Website: [email or web address] 1:02 PM   Following is a copy of your plan of care:   Goals Addressed             This Visit's Progress    LCSW [facility] Social Work Care Plan   On track    Problems:   Disease Management support and education needs related to MDD and GAD  CSW Clinical Goal(s):   Over the next 90 days the Patient will attend all scheduled medical appointments as evidenced by patient report and care team review of appointment completion in electronic MEDICAL RECORD NUMBER  demonstrate a reduction in symptoms related to MDD and GAD .  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 Pt is interested in re-establishing with previous counselor at [facility] Dept 12/30: Pt was referred to [facility] [place], have not heard from them. Agreed to call in 2 weeks if she does not hear back  Emotional Support Provided Motivational Interviewing employed Provided general psycho-education for mental health needs Quality of sleep assessed & Sleep Hygiene techniques promoted Reviewed mental health medications and discussed importance of compliance: Pt reports compliance with medications that were recently adjusted Suicidal Ideation/Homicidal Ideation assessed: Pt denies active SI/HI with plan or intent GAD7 reviewed Solution Focused Strategies discussed. Pt will continue to practice healthy coping skills to assist with symptom managment  Patient Goals/Self-Care Activities:  Continue taking your medication as prescribed.   Increase coping skills, healthy habits, and stress reduction  Follow up with [facility] to inquire about referral placed by PCP in November 2025  Utilize counseling and/or crisis intervention resources provided via email  Plan:   Telephone follow up appointment with care management team member scheduled for:  4 weeks

## 2024-01-26 NOTE — Patient Outreach (Signed)
 Complex Care Management   Visit Note  01/26/2024  Name:  Morgan Collier MRN: [id number] DOB: Feb 01, 2000  Situation: Referral received for Complex Care Management related to Mental/Behavioral Health diagnosis Depression and Anxiety I obtained verbal consent from Patient.  Visit completed with Patient  on the phone  Background:   Past Medical History:  Diagnosis Date   Anemia    Asthma    Seasonal   First trimester screening    Gestational diabetes    Hidradenitis suppurativa of right axilla    History of anemia    Labor and delivery indication for care or intervention 08/02/2018   Labor and delivery, indication for care 08/02/2018   Normal labor and delivery 08/02/2018   Pneumonia    covid pna    Assessment: Patient Reported Symptoms:  Cognitive Cognitive Status: No symptoms reported, Alert and oriented to person, place, and time, Normal speech and language skills Cognitive/Intellectual Conditions Management [RPT]: None reported or documented in medical history or problem list      Neurological Neurological Review of Symptoms: Not assessed    HEENT HEENT Symptoms Reported: Not assessed      Cardiovascular Cardiovascular Symptoms Reported: Not assessed    Respiratory Respiratory Symptoms Reported: No symptoms reported Additional Respiratory Details: Patient recently established with Pulmonologist Respiratory Management Strategies: Asthma action plan, Coping strategies, Medication therapy  Endocrine Endocrine Symptoms Reported: No symptoms reported    Gastrointestinal Gastrointestinal Symptoms Reported: Not assessed      Genitourinary Genitourinary Symptoms Reported: Not assessed    Integumentary Integumentary Symptoms Reported: Not assessed    Musculoskeletal Musculoskelatal Symptoms Reviewed: Not assessed        Psychosocial Psychosocial Symptoms Reported: No symptoms reported Behavioral Management Strategies: Coping strategies, Support system, Medication  therapy Behavioral Health Comment: Pt reports better management of anxiety and depression symptoms with continued medication management. Will f/up with counseling referral Major Change/Loss/Stressor/Fears (CP): Medical condition, self Techniques to Cope with Loss/Stress/Change: Diversional activities, Medication      01/26/2024    PHQ2-9 Depression Screening   Little interest or pleasure in doing things    Feeling down, depressed, or hopeless    PHQ-2 - Total Score    Trouble falling or staying asleep, or sleeping too much    Feeling tired or having little energy    Poor appetite or overeating     Feeling bad about yourself - or that you are a failure or have let yourself or your family down    Trouble concentrating on things, such as reading the newspaper or watching television    Moving or speaking so slowly that other people could have noticed.  Or the opposite - being so fidgety or restless that you have been moving around a lot more than usual    Thoughts that you would be better off dead, or hurting yourself in some way    PHQ2-9 Total Score    If you checked off any problems, how difficult have these problems made it for you to do your work, take care of things at home, or get along with other people    Depression Interventions/Treatment      There were no vitals filed for this visit.    Medications Reviewed Today     Reviewed by Aalyssa Elderkin D, LCSW (Social Worker) on 01/26/24 at 1257  Med List Status: <None>   Medication Order Taking? Sig Documenting Provider Last Dose Status Informant  coconut oil OIL [id number]  Apply 1 Application topically as needed.  Patient not taking: Reported on 12/28/2023   Dickerson, Felicia, CNM  Active   dibucaine (NUPERCAINAL) 1 % OINT [id number]  Place 1 Application rectally as needed for hemorrhoids.  Patient not taking: Reported on 12/28/2023   Dickerson, Felicia, CNM  Active   escitalopram  (LEXAPRO ) 10 MG tablet [id number]  Take 10 mg by  mouth daily. [provider]  Active Self  ferrous sulfate  325 (65 FE) MG tablet [id number]  Take 1 tablet (325 mg total) by mouth 2 (two) times daily with a meal. Aisha Heller, CNM  Active   FLOVENT  HFA 44 MCG/ACT inhaler [id number]    Patient not taking: Reported on 12/28/2023   [provider]  Active Self  fluticasone  (FLONASE ) 50 MCG/ACT nasal spray [id number]  Place 1 spray into both nostrils daily as needed.  [provider]  Active Self  ibuprofen  (ADVIL ) 600 MG tablet [id number]  Take 1 tablet (600 mg total) by mouth every 6 (six) hours.  Patient not taking: Reported on 12/28/2023   Dickerson, Felicia, CNM  Active   NIFEdipine  (ADALAT  CC) 60 MG 24 hr tablet [id number]  Take 1 tablet (60 mg total) by mouth daily.  Patient not taking: Reported on 12/28/2023   Aisha Heller, CNM  Expired 10/13/23 2359   oxyCODONE -acetaminophen  (PERCOCET/ROXICET) 5-325 MG tablet [id number]  Take 1 tablet by mouth every 4 (four) hours as needed for moderate pain (pain score 4-7/10).  Patient not taking: Reported on 12/28/2023   Aisha Heller, CNM  Active   senna-docusate (SENOKOT-S) 8.6-50 MG tablet [id number]  Take 2 tablets by mouth daily.  Patient not taking: Reported on 12/28/2023   Aisha Heller, CNM  Active   simethicone  (MYLICON) 80 MG chewable tablet [id number]  Chew 1 tablet (80 mg total) by mouth as needed for flatulence.  Patient not taking: Reported on 12/28/2023   Dickerson, Felicia, CNM  Active   valACYclovir (VALTREX) 1000 MG tablet [id number]  Take 1,000 mg by mouth 2 (two) times daily. PRN for onset symptoms [provider]  Active Self  witch hazel-glycerin  (TUCKS) pad [id number]  Apply 1 Application topically as needed for hemorrhoids.  Patient not taking: Reported on 12/28/2023   Dickerson, Felicia, CNM  Active             Recommendation:   Continue Current Plan of Care  Follow Up Plan:   Telephone follow-up in 1 month  Rolin Kerns, LCSW [place]  [facility], [facility] Clinical Social Worker Direct Dial: [phone]  Fax: [phone] Website: [email or web address] 1:02 PM

## 2024-02-10 NOTE — Progress Notes (Signed)
 " [facility] - OBSTETRICS AND GYNECOLOGY   PCP: Sherial Bail, MD  Chief Complaint  Patient presents with   Annual Exam    History of Present Illness:  [name] is a 24 y.o. [phone] who LMP was Patient's last menstrual period was 01/28/2024 (exact date)., presents today for her annual examination.  Her menses are regular every 28-30 days, lasting 4 day(s).  Dysmenorrhea mild, occurring first 1-2 days of flow. She does not have intermenstrual bleeding.  She is thinking about contraception, but is scared to have something that will alter her mood. She has been on Depo Provera  in the past. This affected her mood. She tried pills, but couldn't remember to take them. These didn't seem to affect her mood. She had considered Nexplanon, but became anxious.     She is sexually active. Denies dyspareunia.  Last Pap: 12/09/2021.  Results were: no abnormalities /neg HPV DNA negative (by PCP) Hx of STDs: chlamydia, distant and treated  There is no FH of breast cancer. There is no FH of ovarian cancer.   Tobacco use: The patient denies current or previous tobacco use. Alcohol use: occasional Exercise: none   The patient wears seatbelts: yes.   The patient reports that domestic violence in her life is absent.   Past Medical History:  Diagnosis Date   Asthma without status asthmaticus ([facility]-HCC)    Constipation due to slow transit    pt uses miralax as needed   Diet controlled gestational diabetes mellitus (GDM), antepartum ([facility]HS-HCC)    Gestational diabetes mellitus, currently pregnant ([facility]HS-HCC) 05/24/2018   GTT on 05/24/2018  was 216 Referral to [facility] placed    History of chlamydia 04/2016   Seasonal allergies     Past Surgical History:  Procedure Laterality Date   CESAREAN SECTION  06/28/2019   CESAREAN SECTION  10/09/2022   [facility], Dr. Garnette Mace   right underarm surgery     wisdom teeth extraction       Prior to Admission medications   Medication Sig Taking? Last Dose  escitalopram  oxalate (LEXAPRO ) 10 MG tablet Take 1.5 tablets (15 mg total) by mouth once daily for 90 days Yes Taking  VENTOLIN  HFA 90 mcg/actuation inhaler INHALE 2 PUFFS INTO THE LUNGS EVERY 6 HOURS AS NEEDED FOR WHEEZING Yes Taking   No Known Allergies  Obstetric History: [id number]  Social History   Socioeconomic History   Marital status: Single  Occupational History   Occupation: Engineer, Technical Sales    Comment: Medical Assisting  Tobacco Use   Smoking status: Never   Smokeless tobacco: Never  Vaping Use   Vaping status: Never Used  Substance and Sexual Activity   Alcohol use: No   Drug use: No   Sexual activity: Yes    Partners: Male    Birth control/protection: None   Social Drivers of Health   Financial Resource Strain: Low Risk  (02/10/2024)   Overall Financial Resource Strain (CARDIA)    Difficulty of Paying Living Expenses: Not hard at all  Food Insecurity: No Food Insecurity (02/10/2024)   Hunger Vital Sign    Worried About Running Out of Food in the Last Year: Never true    Ran Out of Food in the Last Year: Never true  Transportation Needs: No Transportation Needs (02/10/2024)   PRAPARE - Administrator, Civil Service (Medical): No    Lack of Transportation (Non-Medical): No  Physical Activity: Insufficiently Active (08/02/2018)   Received from [facility]  Exercise Vital Sign    On average, how many days per week do you engage in moderate to strenuous exercise (like a brisk walk)?: 2 days    On average, how many minutes do you engage in exercise at this level?: 10 min  Stress: No Stress Concern Present (08/02/2018)   Received from [facility] - Occupational Stress Questionnaire    Feeling of Stress : Not at all  Social Connections: Moderately Integrated (08/02/2018)   Received from [facility]   Social Connection and Isolation Panel    In a typical week,  how many times do you talk on the phone with family, friends, or neighbors?: Three times a week    How often do you get together with friends or relatives?: Three times a week    How often do you attend church or religious services?: 1 to 4 times per year    Do you belong to any clubs or organizations such as church groups, unions, fraternal or athletic groups, or school groups?: Yes    How often do you attend meetings of the clubs or organizations you belong to?: Never    Are you married, widowed, divorced, separated, never married, or living with a partner?: Never married  Housing Stability: Low Risk  (02/10/2024)   Housing Stability Vital Sign    Unable to Pay for Housing in the Last Year: No    Number of Times Moved in the Last Year: 0    Homeless in the Last Year: No    Family History  Problem Relation Name Age of Onset   No Known Problems Mother     No Known Problems Father     High blood pressure (Hypertension) Maternal Grandmother     Heart disease Maternal Grandmother     Diabetes Paternal Grandmother      Review of Systems  Constitutional: Negative.   HENT: Negative.    Eyes: Negative.   Respiratory: Negative.    Cardiovascular: Negative.   Gastrointestinal: Negative.   Genitourinary: Negative.   Musculoskeletal: Negative.   Skin: Negative.   Neurological: Negative.   Endo/Heme/Allergies: Negative.   Psychiatric/Behavioral: Negative.       Physical Exam BP 119/77   Pulse (!) 114   Ht 160 cm (5' 3)   Wt 75.3 kg (166 lb)   LMP 01/28/2024 (Exact Date)   Breastfeeding No   BMI 29.41 kg/m    Physical Exam Constitutional:      General: She is not in acute distress.    Appearance: Normal appearance.  Genitourinary:     Bladder normal.     No lesions in the vagina.     Right Labia: No rash, tenderness, lesions, skin changes or Bartholin's cyst.    Left Labia: No tenderness, lesions, skin changes, Bartholin's cyst or rash.    No labial fusion  noted.     No inguinal adenopathy present in the right or left side.    Pelvic Tanner Score: 5/5.    No vaginal discharge, bleeding or ulceration.     No vaginal prolapse present.    No vaginal atrophy present.     Right Adnexa: not tender, not full and no mass present.    Left Adnexa: not tender, not full and no mass present.    No cervical lesion or polyp.     No parametrium nodularity or thickening present.    Uterus is not enlarged or tender.     No  uterine mass detected.    Uterus is anteverted.     No urethral tenderness present.     Pelvic exam was performed with patient in the lithotomy position.  HENT:     Head: Normocephalic and atraumatic.  Eyes:     General: No scleral icterus.    Conjunctiva/sclera: Conjunctivae normal.  Cardiovascular:     Rate and Rhythm: Normal rate and regular rhythm.     Heart sounds: No murmur heard.    No friction rub. No gallop.  Pulmonary:     Effort: Pulmonary effort is normal. No respiratory distress.     Breath sounds: Normal breath sounds. No wheezing, rhonchi or rales.  Abdominal:     General: There is no distension.     Palpations: Abdomen is soft. There is no mass.     Tenderness: There is no abdominal tenderness. There is no guarding or rebound.     Hernia: There is no hernia in the left inguinal area or right inguinal area.  Musculoskeletal:        General: No swelling. Normal range of motion.  Lymphadenopathy:     Lower Body: No right inguinal adenopathy. No left inguinal adenopathy.  Neurological:     General: No focal deficit present.     Mental Status: She is alert and oriented to person, place, and time.     Cranial Nerves: No cranial nerve deficit.  Skin:    General: Skin is warm and dry.     Findings: No lesion.  Psychiatric:        Mood and Affect: Mood normal.        Behavior: Behavior normal.        Judgment: Judgment normal.  Vitals reviewed. Exam conducted with a chaperone present.     PE Chaperone note: A  chaperone was included for sensitive portions of the exam- staff member name: Bryce Jubilee, CMA.  Chaperone present for pelvic exam.   Results: AUDIT Questionnaire (screen for alcoholism): 2 PHQ-9: 13 (denies thoughts of self-harm), treated by Dr. Sherial    Assessment: 24 y.o. [phone] female here for routine annual gynecologic examination  Plan: Problem List Items Addressed This Visit   None Visit Diagnoses       Encntr for gyn exam (general) (routine) w/o abn findings    -  Primary     Depression screening       Relevant Orders   Depression Screen -(PHQ- 2/9, BDI) (Completed)     Routine screening for STI (sexually transmitted infection)       Relevant Orders   Ct, Ng, Trich vag by NAA - [facility]     Screening for alcohol problem         Cervical cancer screening         Encounter for initial prescription of transdermal patch hormonal contraceptive device       Relevant Medications   norelgestromin-ethinyl estradiol  PAMALEE) 150-35 mcg/24 hr patch     Umbilical hernia without obstruction and without gangrene       Relevant Orders   Ambulatory Referral to General Surgery      Return in about 1 year (around 02/09/2025) for Annual Gyn Exam.   Screening: -- Blood pressure screen normal -- Weight screening: normal -- Depression screening positive (PHQ-9). Seeing PCP for this -- Nutrition: normal -- cholesterol screening: per PCP -- osteoporosis screening: not due -- tobacco screening: not using -- alcohol screening: AUDIT questionnaire indicates low-risk usage. -- family history of breast cancer  screening: done. not at high risk. -- no evidence of domestic violence or intimate partner violence. -- STD screening: gonorrhea/chlamydia NAAT collected -- pap smear collected per ASCCP guidelines -- flu vaccine received -- HPV vaccination series: received  Reviewed all forms of birth control options available including abstinence; over the counter/barrier methods; hormonal  contraceptive medication including pill, patch, ring, injection,contraceptive implant; hormonal and nonhormonal IUDs; permanent sterilization options including vasectomy and the various tubal sterilization modalities. Risks and benefits reviewed.  Questions were answered.  Rx given for Xulane.    Umbilical hernia: notable on exam: Referral to general surgery for recs and management.    Attestation Statement:   I personally performed the service. (TP)  STEPHEN TORIBIO MACE, MD  [facility] 02/10/2024 3:23 PM    "

## 2024-03-01 ENCOUNTER — Telehealth: Admitting: Licensed Clinical Social Worker

## 2024-03-10 ENCOUNTER — Telehealth: Admitting: Licensed Clinical Social Worker
